# Patient Record
Sex: Male | Born: 1975 | Race: Black or African American | Hispanic: No | Marital: Single | Smoking: Light tobacco smoker
Health system: Southern US, Community
[De-identification: ages and names within clinical notes are randomized; demographics above are authoritative.]

## PROBLEM LIST (undated history)

## (undated) DIAGNOSIS — I429 Cardiomyopathy, unspecified: Secondary | ICD-10-CM

## (undated) DIAGNOSIS — N319 Neuromuscular dysfunction of bladder, unspecified: Secondary | ICD-10-CM

## (undated) DIAGNOSIS — I2510 Atherosclerotic heart disease of native coronary artery without angina pectoris: Secondary | ICD-10-CM

## (undated) DIAGNOSIS — G8220 Paraplegia, unspecified: Secondary | ICD-10-CM

## (undated) DIAGNOSIS — L89893 Pressure ulcer of other site, stage 3: Secondary | ICD-10-CM

## (undated) DIAGNOSIS — Z95810 Presence of automatic (implantable) cardiac defibrillator: Secondary | ICD-10-CM

## (undated) DIAGNOSIS — G629 Polyneuropathy, unspecified: Secondary | ICD-10-CM

## (undated) DIAGNOSIS — K4090 Unilateral inguinal hernia, without obstruction or gangrene, not specified as recurrent: Secondary | ICD-10-CM

## (undated) DIAGNOSIS — H409 Unspecified glaucoma: Secondary | ICD-10-CM

## (undated) DIAGNOSIS — Z87440 Personal history of urinary (tract) infections: Secondary | ICD-10-CM

## (undated) HISTORY — PX: SACRAL DECUBITUS ULCER EXCISION: SUR512

## (undated) HISTORY — PX: OTHER SURGICAL HISTORY: SHX169

## (undated) HISTORY — PX: CARDIAC DEFIBRILLATOR PLACEMENT: SHX171

---
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
## 2006-06-29 ENCOUNTER — Emergency Department (HOSPITAL_COMMUNITY): Admission: EM | Admit: 2006-06-29 | Discharge: 2006-06-29 | Payer: Self-pay | Admitting: Emergency Medicine

## 2006-07-18 ENCOUNTER — Emergency Department (HOSPITAL_COMMUNITY): Admission: EM | Admit: 2006-07-18 | Discharge: 2006-07-18 | Payer: Self-pay | Admitting: Emergency Medicine

## 2006-07-19 ENCOUNTER — Emergency Department (HOSPITAL_COMMUNITY): Admission: EM | Admit: 2006-07-19 | Discharge: 2006-07-19 | Payer: Self-pay | Admitting: Emergency Medicine

## 2006-07-27 ENCOUNTER — Emergency Department (HOSPITAL_COMMUNITY): Admission: EM | Admit: 2006-07-27 | Discharge: 2006-07-27 | Payer: Self-pay | Admitting: Emergency Medicine

## 2006-07-30 ENCOUNTER — Ambulatory Visit: Payer: Self-pay | Admitting: Internal Medicine

## 2006-07-30 ENCOUNTER — Encounter: Payer: Self-pay | Admitting: Emergency Medicine

## 2006-07-30 ENCOUNTER — Inpatient Hospital Stay (HOSPITAL_COMMUNITY): Admission: EM | Admit: 2006-07-30 | Discharge: 2006-08-03 | Payer: Self-pay | Admitting: Cardiovascular Disease

## 2006-08-17 ENCOUNTER — Ambulatory Visit: Payer: Self-pay | Admitting: Cardiology

## 2006-09-29 ENCOUNTER — Encounter (HOSPITAL_COMMUNITY): Admission: RE | Admit: 2006-09-29 | Discharge: 2006-12-28 | Payer: Self-pay | Admitting: Cardiology

## 2006-10-13 ENCOUNTER — Encounter: Payer: Self-pay | Admitting: Cardiology

## 2006-10-13 ENCOUNTER — Ambulatory Visit: Payer: Self-pay

## 2006-11-18 ENCOUNTER — Ambulatory Visit: Payer: Self-pay | Admitting: Cardiology

## 2006-12-08 ENCOUNTER — Ambulatory Visit: Payer: Self-pay | Admitting: Cardiology

## 2007-02-24 ENCOUNTER — Emergency Department (HOSPITAL_COMMUNITY): Admission: EM | Admit: 2007-02-24 | Discharge: 2007-02-24 | Payer: Self-pay | Admitting: Emergency Medicine

## 2011-02-26 NOTE — Assessment & Plan Note (Signed)
[facility]                            CARDIOLOGY OFFICE NOTE   AUTHUR, CUBIT                    MRN:          [id number]  DATE:11/18/2006                            DOB:          02/16/1976    HISTORY OF PRESENT ILLNESS:  [name] [name] is a 35 year old gentleman  who presented in October 2007 with inferior myocardial infarction.  It  appeared that he had also suffered an anterior myocardial infarction  approximately 1 week prior by symptoms and electrocardiogram.  We  emergently revascularized the RCA and subsequently the LAD using bare-  metal stents.  At that time, his ejection fraction was 45%.  His post  infarct course has been uncomplicated clinically.  However, repeat  echocardiogram performed October 13, 2006 demonstrated decrease in his EF  to 35% to 30% with akinesis of the apex and mid to distal septum, mid to  distal anterior wall and mid to distal inferior wall.  There was no  significant mitral regurgitation.   He has been doing well at cardiac rehabilitation with some minimal  dyspnea.  He has not had any orthopnea, PND, palpitations, syncope or  presyncope.   CURRENT MEDICATIONS:  1. Plavix 75 mg daily.  2. Lipitor 80 mg daily.  3. Ditropan 5 mg twice daily.  4. Coreg 6.225 mg twice daily.  5. Lisinopril 10 mg daily.  6. Cipro 500 mg twice daily, which he is taking for abscess on his      face.  7. Aspirin 81 mg daily.   PHYSICAL EXAMINATION:  He is generally well appearing in his wheelchair.  Heart rate is 73.  Blood pressure 138/88.  Weight of 224 pounds.  He has no jugular venous distention, thyromegaly, or lymphadenopathy.  HEENT:  Remarkable for skin abscess over his left lower face.  LUNGS:  Clear to auscultation.  Respiratory effort is normal.  He has a nondisplaced point of maximal cardiac impulse.  There is  regular rate and rhythm with normal S1 and S2.  There is no murmur or  S3.  There is an S4.  ABDOMEN:  Soft, non-distended, non-tender.  There is no  hepatosplenomegaly.  Bowel sounds are normal.  He is paraplegic in a wheelchair.  EXTREMITIES:  Warm without edema.   Electrocardiogram demonstrates normal sinus rhythm with evolving  anterolateral myocardial infarction.   IMPRESSION/RECOMMENDATIONS:  1. Coronary disease:  Status post anterior and inferior myocardial      infarctions.  Ejection fraction declined from 45% to 25% to 30%.      We will increase Lisinopril to 20 mg daily and Coreg to 12.5 mg      twice daily.  Continue aspirin and Plavix.  Can stop the Plavix in      June.  We will refer him to Heart Failure Clinic for up titration      of his medications and possible initiation of spironolactone and/or      BiDil.  2. Paraplegia.  3. Hypercholesterolemia:  Check lipids and LFTs at next visit.  4. Risk of ventricular arrhythmia:  With ejection fraction down, we  can consider implantable cardioverter-defibrillator implantation.      However, since he is asymptomatic and we are still up titrating his      heart failure medicines, I would like to see if we can get any      beneficial impact on the ejection fraction, which might allow [name] to      avoid consideration of an implantable cardioverter-defibrillator.      We will, therefore, defer this until after full up titration of      medications, at which time we will obtain another echocardiogram.     [name] Farber, [name] [name]  Electronically Signed    WED/MedQ  DD: 11/18/2006  DT: 11/18/2006  Job #: [id number]

## 2011-02-26 NOTE — Cardiovascular Report (Signed)
NAME:  [name] [name], [name] [name]           ACCOUNT NO.:  [id number]   MEDICAL RECORD NO.:  [id number]            PATIENT TYPE:   LOCATION:                                 FACILITY:   PHYSICIAN:  Salvadore Farber, MD       DATE OF BIRTH:   DATE OF PROCEDURE:  08/02/2006  DATE OF DISCHARGE:                              CARDIAC CATHETERIZATION   PROCEDURE:  Bare metal stenting of the proximal LAD and proximal ramus  intermedius.   INDICATIONS:  [name] [name] is a 35 year old gentleman who presented with  myocardial infarction ST elevation myocardial infarction on October 20.  While electrocardiogram suggested anterior myocardial infarction he had that  TIMI 3 flow in the LAD but TIMI 2 flow with severe disease in the right  coronary artery.  In retrospect, it appears that he had had an anterior MI  one week prior to presentation and then presented with in the inferior non-  ST elevation myocardial infarction acutely.  We treated the right  successfully on October 20.  Postinfarct course has been uncomplicated.  He  now returns for percutaneous intervention of the severe disease of the LAD  and ramus.   PROCEDURAL TECHNIQUE:  Informed consent was obtained.  Under 1% lidocaine  local anesthesia, a 7-French sheath was placed in left common femoral artery  using the modified Seldinger technique.  Anticoagulation was initiated with  bivalirudin.  ACT was confirmed to be greater than 225 seconds.  A CLS 3.5  guide was advanced over wire and engaged in the ostium of the left main.   Attention was first turned to the LAD.  A Prowater wire was advanced to the  distal vessel without difficulty.  The lesion was directly stented using a  2.75 x 32 mm Liberte bare metal stent deployed at 16 atmospheres.  It was  then postdilated using a 3.0 x 18 mm PowerSail at 16 atmospheres.  Immediately after stent deployment, there was severe spasm of the distal  vessel.  This resolved with the administration of  intracoronary  nitroglycerin.  However, there remained a stenosis just at the distal stent  margin which appeared consistent with refractory spasm.  We elected to leave  the wire across this lesion and treat the ramus while we waited for what was  probably spasm to abate.   Attention was then turned to the ramus intermedius.  A Prowater wire was  advanced across into the distal ramus.  I predilated the lesion using a 2.0  x 9 mm Maverick at 4 atmospheres more distally and 6 atmospheres slightly  more proximally.  I then stented the lesion using a 2.0 x 15 mm mini Vision  deployed at 14 atmospheres.  I postdilated the stent using a 2.25 x 15 mm  Quantum at 16 atmospheres.  Final angiography demonstrated no residual  stenosis, no dissection, and TIMI 3 flow to the distal vasculature.  There  was some spasm just distal to the stented segment.   Attention was then returned to the LAD.  The spasm at the distal stent  margin had abated.  There was  no dye staining to suggest dissection.  Wires  were then removed.  The patient was transferred to holding room in stable  condition having tolerated the procedure well.   COMPLICATIONS:  None.   IMPRESSION/PLAN:  Successful bare metal stenting of the LAD and ramus  intermedius.  The patient will be maintained on aspirin indefinitely, and  Plavix for a year given his acute presentation and treatment with bare metal  stents.      Salvadore Farber, MD  Electronically Signed     WED/MEDQ  D:  08/02/2006  T:  08/03/2006  Job:  [id number]
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
## 2011-02-26 NOTE — Assessment & Plan Note (Signed)
[facility]                            CARDIOLOGY OFFICE NOTE   CORBEN, AUZENNE                    MRN:          [id number]  DATE:12/08/2006                            DOB:          [birth year]    PRIMARY:  None.   PRIMARY CARDIOLOGIST:  Dr. Samule Ohm.   REASON FOR EVALUATION:  Evaluate patient with ischemic cardiomyopathy.   HISTORY OF PRESENT ILLNESS:  The patient is a pleasant 35 year old  gentleman with ischemic cardiomyopathy following an anterior myocardial  infarction.  He has done marginally well.   INCOMPLETE DICTATION     Rollene Rotunda, MD, [facility]  Electronically Signed    JH/MedQ  DD: 12/08/2006  DT: 12/08/2006  Job #: [id number]

## 2011-02-26 NOTE — Discharge Summary (Signed)
NAME:  [name] [name], [name] [name]           ACCOUNT NO.:  [id number]   MEDICAL RECORD NO.:  [id number]          PATIENT TYPE:  INP   LOCATION:  6525                         FACILITY:  [facility]   PHYSICIAN:  Bettey Mare. Lawrence, NPDATE OF BIRTH:  [birth year]   DATE OF ADMISSION:  07/30/2006  DATE OF DISCHARGE:  08/03/2006                                 DISCHARGE SUMMARY   ADDENDUM:  This is the patient's discharge medications, which were left out  on his discharge summary.  Please make sure this is attached to his  discharge summary that gets sent to our office to the attention of Dr.  Randa Evens.   DISCHARGE MEDICATIONS:  1. Enteric-coated aspirin 81 mg 1 p.o. daily.  2. Plavix 75 mg 1 p.o. daily.  3. Lipitor 80 mg 1 p.o. daily.  4. Protonix 40 mg 1 p.o. daily.  5. Coreg 3.125 mg b.i.d.  6. Lisinopril 5 mg 1 p.o. daily.  7. Ditropan 5 mg t.i.d.  8. Nitroglycerin 0.4 mg sublingual p.r.n. chest discomfort.           ______________________________  Bettey Mare. Lyman Bishop, NP     KML/MEDQ  D:  08/03/2006  T:  08/04/2006  Job:  [id number]   cc:   Salvadore Farber, MD
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
## 2011-02-26 NOTE — Assessment & Plan Note (Signed)
[facility]                              CARDIOLOGY OFFICE NOTE   [name], [name]                    MRN:          [id number]  DATE:08/17/2006                            DOB:          08/25/1976    HISTORY OF PRESENT ILLNESS:  [name] [name] is a 35 year old gentleman who  presented on October 20 with inferior myocardial infarction.  This appeared  to be in the setting of an anterior myocardial infarction, having occurred  one week prior.  He underwent emergent revascularization of the right  coronary artery and subsequently underwent stenting of the LAD and ramus.  Bare-metal stents were used for all.  Ejection fraction was approximately  45% with anterolateral and apical akinesis.   His postinfarct course was uncomplicated.  During hospitalization his ACE  inhibitor and beta blocker doses were limited due to low blood pressure.   Since discharge he has had some mild exertional dyspnea, particularly with  transfer from his wheelchair to a chair.  This is improved over the past  week.  He has not had any recurrence of the chest discomfort.  He has not  had any edema, palpitations, syncope or presyncope.   CURRENT MEDICATIONS:  1. Enteric-coated aspirin 81 mg per day.  2. Plavix 75 mg per day.  3. Lipitor 80 mg per day.  4. Protonix 40 mg per day.  5. Coreg 3.125 mg twice per day.  6. Lisinopril 5 mg per day.  7. Ditropan 5 mg twice per day.   PHYSICAL EXAMINATION:  He is generally well-appearing, in no distress.  Heart rate is 83, blood pressure 122/84.  He has no jugular venous distention, no thyromegaly.  LUNGS:  Clear to auscultation.  He has a nondisplaced point of maximal cardiac impulse.  There is a regular  rate and rhythm with normal S1 and S2.  There is no murmur or S3.  There is  an S4.  ABDOMEN:  Soft, nondistended, nontender.  There is no hepatosplenomegaly.  Bowel sounds are normal.  He is a paraplegic in a  wheelchair.  EXTREMITIES:  Warm without edema.   Electrocardiogram demonstrates normal sinus rhythm with evolving  anterolateral myocardial infarction.   IMPRESSION/RECOMMENDATIONS:  1. Coronary artery disease, status post anterior and inferior myocardial      infarctions:  Fortunately, ejection fraction was 45%.  We will continue      aspirin indefinitely.  Continue Plavix for 9 months.  Increase      lisinopril to 10 mg per day and Coreg to 6.25 mg twice per day.  Plan      on repeat echocardiogram to assess left ventricular systolic function      in approximately 6 weeks' time.  2. Hypercholesterolemia:  Continue Lipitor.  3. Post hospital cleanup:  Stop Protonix, as this was started for      prophylaxis in the hospital.  4. Need for primary care doctor:  We will get him back to [facility].  5. Paraplegia.     Salvadore Farber, MD  Electronically Signed    WED/MedQ  DD: 08/17/2006  DT: 08/18/2006  Job #: [id number]
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
## 2011-02-26 NOTE — Cardiovascular Report (Signed)
NAME:  [name] [name], [name] [name] NO.:  [id number]   MEDICAL RECORD NO.:  [id number]            PATIENT TYPE:   LOCATION:                                 FACILITY:   PHYSICIAN:  Salvadore Farber, MD       DATE OF BIRTH:   DATE OF PROCEDURE:  07/30/2006  DATE OF DISCHARGE:                              CARDIAC CATHETERIZATION   PROCEDURE:  Left heart catheterization, left ventriculography, coronary  angiography, placement of 4 bare metal stents to the right coronary artery,  AngioSeal closure of the right common femoral arteriotomy site.   INDICATIONS:  [name] [name] is a 35 year old gentleman without prior  history of atherosclerotic disease. Risk factors of tobacco and possible  cocaine use.  He presented to hospital 1 week ago with chest discomfort and,  shortness of breath, and hemoptysis.  He was diagnosed with pneumonia,  treated with antibiotics and narcotics.  Symptoms have persisted throughout  the week.  He represented to [facility], this afternoon, with same  symptoms. He has been short of breath throughout the week.  However, at  approximately 12:30, this afternoon, chest discomfort increased markedly  prompting him to represent; it remains ongoing 7/10.   Electrocardiogram demonstrates normal sinus rhythm with anterolateral Q  waves with anterolateral ST elevations.  While it does appear that he  suffered a large anterior myocardial infarction approximately 1 week ago by  electrocardiogram and history, he appears to be having a recurrent  myocardial infarction.  We, therefore, brought him to the cardiac  catheterization lab for angiography with an adequate percutaneous  revascularization.   PROCEDURAL TECHNIQUE:  Informed consent was obtained.  Under 1% lidocaine  local anesthesia, a 6-French sheath was placed in the right common femoral  artery using the modified Seldinger technique.  Diagnostic angiography and  ventriculography were performed  using JL-4, JR-4, and pigtail catheters.  These images demonstrated a 95% stenosis in the proximal LAD and a 90%  stenosis in the ramus intermedius and a 95% stenosis in the right coronary  artery.  There was anterolateral akinesis on ventriculogram.  Flow in the  LAD and ramus was TIMI 3.  However, flow in the right coronary artery was  TIMI 2. In addition, he had occlusion of a small posterolateral branch of  the right coronary.  Based on this distal occlusion and flow pattern, it  appeared that he had suffered an anterior myocardial infarction 1 week ago  and was now having an inferior non-ST elevation myocardial infarction.  With  that said, it was difficult to be certain of the culprit lesion.  For the  reasons stated above, however, I decided to proceed with percutaneous  revascularization of the right coronary artery.   Heparin had been administered in the emergency room; additional heparin was  given to achieve and maintain an ACT of greater than 275 seconds.  I chose  not to use a glycoprotein 2B 3A antagonist due to the patient's hemoptysis.  A JR-4 guiding catheter was side holes was advanced over the wire and  engaged in the ostium of the right coronary  artery.  A Prowater wire was  advanced into the posterior left ventricular branch without difficulty.  I  began by predilating the distal RCA using a 2.5 x 15 mm Maverick.  This  established TIMI 3 flow to the PDA; however, the posterior left ventricular  branch remained occluded.  I advanced the balloon into the posterior left  ventricular branch and performed additional balloon angioplasty there.  This  did not; however, restore any flow to it.  As the collaterals had suggested  that the posterior left ventricular branch was fairly small, I decided to  continue with revascularization of the main portion of the right coronary  artery as this supplied a much larger territory.  To that end, I advanced a  3.0 x 32 mm Liberte stent  to the distal right coronary artery extending  across disease in the ostium of the PDA into the proximal portion of the PDA  itself.  I deployed this at 14 atmospheres.  I then advanced a 2.75 x 16 mm  Liberte stent so as to overlap the proximal portion of this stent, and cover  additional segment stenosis in this distal right coronary.  I deployed it at  16 atmospheres.  I then postdilated both of the stents using a 3 mm Quantum  at 16 atmospheres and 18 atmospheres at the overlap site.   I then turned my attention to the stenosis at the ostium.  This 70% ostial  right coronary artery stenosis was stented using a 3.5 x 24 mm Liberte.  I  deployed at 16 atmospheres.  We then turned attention to the hazy 70%  stenosis in the midportion of the right coronary.  I stented this using a  3.0 x 24 mm Liberte deployed at 16 atmospheres.  I then postdilated the mid-  and-proximal stents using a 3.5 mm Quantum.  Final angiography demonstrated  no residual stenosis, no dissection, and TIMI 3 flow to the distal  vasculature.  There was TIMI 3 flow reestablished to the posterior left  ventricular branch.   The arteriotomy was then closed using an AngioSeal device.  Complete  hemostasis was obtained.  The patient was then transferred to the intensive  care unit in stable condition having tolerated the procedure well.   COMPLICATIONS:  None.   FINDINGS:  1. LV:  135/16/36.  EF 45% with anterolateral and apical akinesis.  2. Left main:  Angiographically normal.  3. LAD:  Fairly large vessel giving rise to a moderate sized branching      diagonal.  The LAD had a 95% stenosis proximally and then a 70%      stenosis in the midportion extending across the diagonal.  The diagonal      is moderate-sized and had 70% stenosis proximally extending into both      major branches.  4. Ramus intermedius:  A fairly large vessel with a 90% stenosis in its      mid section extending across the bifurcation. 5.  Circumflex:  A moderate-sized vessel giving rise to marginals.  It is      angiographically normal.  6. RCA:  A large, dominant vessel.  It is diffusely diseased.  There is a      70% ostial stenosis, a 70% mid vessel stenosis, and then 95 and 90%      stenoses of the distal vessel extending into the ostium of the PDA.      The PLV was occluded.  All of these were treated  with bare metal stents      reducing stenosis to 0% and establishing TIMI 3 flow.   COMPLICATIONS:  None   IMPRESSION/PLAN:  Severe 2-vessel coronary disease with clear-cut evidence  of prior anterior myocardial infarction, but evidence of ongoing inferior,  but strong angiographic evidence of ongoing inferior myocardial infarction.  I treated the right coronary artery successfully establishing TIMI 3 flow.   Will transfer him to the cardiac intensive care unit.  Will avoid beta  blocker due to concern with cocaine use.  Will initiate aspirin, Plavix, ACE  inhibitor and statin. Cessation of tobacco and cocaine use is strongly  advised.  Will plan revascularization of the LAD and ramus intermedius as he  recovers.      Salvadore Farber, MD  Electronically Signed     WED/MEDQ  D:  08/04/2006  T:  08/05/2006  Job:  [id number]

## 2012-07-25 ENCOUNTER — Encounter (HOSPITAL_COMMUNITY): Payer: Self-pay | Admitting: *Deleted

## 2012-07-25 ENCOUNTER — Inpatient Hospital Stay (HOSPITAL_COMMUNITY)
Admission: EM | Admit: 2012-07-25 | Discharge: 2012-08-21 | DRG: 853 | Disposition: A | Discharge status: Skilled Nursing Facility | Payer: Medicaid Other | Attending: Internal Medicine | Admitting: Internal Medicine

## 2012-07-25 ENCOUNTER — Emergency Department (HOSPITAL_COMMUNITY): Payer: Medicaid Other

## 2012-07-25 ENCOUNTER — Inpatient Hospital Stay (HOSPITAL_COMMUNITY): Payer: Medicaid Other

## 2012-07-25 DIAGNOSIS — R6510 Systemic inflammatory response syndrome (SIRS) of non-infectious origin without acute organ dysfunction: Secondary | ICD-10-CM | POA: Diagnosis present

## 2012-07-25 DIAGNOSIS — L03317 Cellulitis of buttock: Secondary | ICD-10-CM | POA: Diagnosis present

## 2012-07-25 DIAGNOSIS — I5022 Chronic systolic (congestive) heart failure: Secondary | ICD-10-CM | POA: Diagnosis present

## 2012-07-25 DIAGNOSIS — I959 Hypotension, unspecified: Secondary | ICD-10-CM

## 2012-07-25 DIAGNOSIS — K5900 Constipation, unspecified: Secondary | ICD-10-CM | POA: Diagnosis present

## 2012-07-25 DIAGNOSIS — N319 Neuromuscular dysfunction of bladder, unspecified: Secondary | ICD-10-CM | POA: Diagnosis present

## 2012-07-25 DIAGNOSIS — N390 Urinary tract infection, site not specified: Secondary | ICD-10-CM

## 2012-07-25 DIAGNOSIS — A498 Other bacterial infections of unspecified site: Secondary | ICD-10-CM | POA: Diagnosis present

## 2012-07-25 DIAGNOSIS — A4101 Sepsis due to Methicillin susceptible Staphylococcus aureus: Secondary | ICD-10-CM

## 2012-07-25 DIAGNOSIS — E871 Hypo-osmolality and hyponatremia: Secondary | ICD-10-CM

## 2012-07-25 DIAGNOSIS — I2510 Atherosclerotic heart disease of native coronary artery without angina pectoris: Secondary | ICD-10-CM | POA: Diagnosis present

## 2012-07-25 DIAGNOSIS — G8220 Paraplegia, unspecified: Secondary | ICD-10-CM

## 2012-07-25 DIAGNOSIS — E860 Dehydration: Secondary | ICD-10-CM

## 2012-07-25 DIAGNOSIS — N289 Disorder of kidney and ureter, unspecified: Secondary | ICD-10-CM

## 2012-07-25 DIAGNOSIS — K4090 Unilateral inguinal hernia, without obstruction or gangrene, not specified as recurrent: Secondary | ICD-10-CM

## 2012-07-25 DIAGNOSIS — L8994 Pressure ulcer of unspecified site, stage 4: Secondary | ICD-10-CM | POA: Diagnosis present

## 2012-07-25 DIAGNOSIS — L89154 Pressure ulcer of sacral region, stage 4: Secondary | ICD-10-CM

## 2012-07-25 DIAGNOSIS — Z905 Acquired absence of kidney: Secondary | ICD-10-CM

## 2012-07-25 DIAGNOSIS — L89109 Pressure ulcer of unspecified part of back, unspecified stage: Secondary | ICD-10-CM

## 2012-07-25 DIAGNOSIS — Y33XXXS Other specified events, undetermined intent, sequela: Secondary | ICD-10-CM

## 2012-07-25 DIAGNOSIS — L98429 Non-pressure chronic ulcer of back with unspecified severity: Secondary | ICD-10-CM

## 2012-07-25 DIAGNOSIS — Z79899 Other long term (current) drug therapy: Secondary | ICD-10-CM

## 2012-07-25 DIAGNOSIS — G8929 Other chronic pain: Secondary | ICD-10-CM | POA: Diagnosis present

## 2012-07-25 DIAGNOSIS — M549 Dorsalgia, unspecified: Secondary | ICD-10-CM | POA: Diagnosis present

## 2012-07-25 DIAGNOSIS — D649 Anemia, unspecified: Secondary | ICD-10-CM

## 2012-07-25 DIAGNOSIS — I330 Acute and subacute infective endocarditis: Secondary | ICD-10-CM | POA: Diagnosis not present

## 2012-07-25 DIAGNOSIS — Z9861 Coronary angioplasty status: Secondary | ICD-10-CM

## 2012-07-25 DIAGNOSIS — L89159 Pressure ulcer of sacral region, unspecified stage: Secondary | ICD-10-CM | POA: Diagnosis present

## 2012-07-25 DIAGNOSIS — L0231 Cutaneous abscess of buttock: Secondary | ICD-10-CM | POA: Diagnosis present

## 2012-07-25 DIAGNOSIS — E43 Unspecified severe protein-calorie malnutrition: Secondary | ICD-10-CM | POA: Diagnosis present

## 2012-07-25 DIAGNOSIS — I2589 Other forms of chronic ischemic heart disease: Secondary | ICD-10-CM | POA: Diagnosis present

## 2012-07-25 DIAGNOSIS — Z87440 Personal history of urinary (tract) infections: Secondary | ICD-10-CM

## 2012-07-25 DIAGNOSIS — I429 Cardiomyopathy, unspecified: Secondary | ICD-10-CM

## 2012-07-25 DIAGNOSIS — I428 Other cardiomyopathies: Secondary | ICD-10-CM

## 2012-07-25 DIAGNOSIS — E876 Hypokalemia: Secondary | ICD-10-CM | POA: Diagnosis not present

## 2012-07-25 DIAGNOSIS — M869 Osteomyelitis, unspecified: Secondary | ICD-10-CM

## 2012-07-25 DIAGNOSIS — Z7982 Long term (current) use of aspirin: Secondary | ICD-10-CM

## 2012-07-25 DIAGNOSIS — T827XXA Infection and inflammatory reaction due to other cardiac and vascular devices, implants and grafts, initial encounter: Secondary | ICD-10-CM

## 2012-07-25 DIAGNOSIS — Z87891 Personal history of nicotine dependence: Secondary | ICD-10-CM

## 2012-07-25 DIAGNOSIS — R6521 Severe sepsis with septic shock: Secondary | ICD-10-CM | POA: Diagnosis present

## 2012-07-25 DIAGNOSIS — N179 Acute kidney failure, unspecified: Secondary | ICD-10-CM

## 2012-07-25 DIAGNOSIS — A419 Sepsis, unspecified organism: Secondary | ICD-10-CM

## 2012-07-25 DIAGNOSIS — I509 Heart failure, unspecified: Secondary | ICD-10-CM | POA: Diagnosis present

## 2012-07-25 DIAGNOSIS — E875 Hyperkalemia: Secondary | ICD-10-CM

## 2012-07-25 DIAGNOSIS — D638 Anemia in other chronic diseases classified elsewhere: Secondary | ICD-10-CM | POA: Diagnosis present

## 2012-07-25 DIAGNOSIS — Y831 Surgical operation with implant of artificial internal device as the cause of abnormal reaction of the patient, or of later complication, without mention of misadventure at the time of the procedure: Secondary | ICD-10-CM | POA: Diagnosis present

## 2012-07-25 DIAGNOSIS — IMO0002 Reserved for concepts with insufficient information to code with codable children: Secondary | ICD-10-CM

## 2012-07-25 DIAGNOSIS — G609 Hereditary and idiopathic neuropathy, unspecified: Secondary | ICD-10-CM | POA: Diagnosis present

## 2012-07-25 HISTORY — DX: Unilateral inguinal hernia, without obstruction or gangrene, not specified as recurrent: K40.90

## 2012-07-25 HISTORY — DX: Presence of automatic (implantable) cardiac defibrillator: Z95.810

## 2012-07-25 HISTORY — DX: Paraplegia, unspecified: G82.20

## 2012-07-25 HISTORY — DX: Personal history of urinary (tract) infections: Z87.440

## 2012-07-25 HISTORY — DX: Cardiomyopathy, unspecified: I42.9

## 2012-07-25 HISTORY — DX: Polyneuropathy, unspecified: G62.9

## 2012-07-25 HISTORY — DX: Unspecified glaucoma: H40.9

## 2012-07-25 HISTORY — DX: Neuromuscular dysfunction of bladder, unspecified: N31.9

## 2012-07-25 HISTORY — DX: Pressure ulcer of other site, stage 3: L89.893

## 2012-07-25 LAB — BASIC METABOLIC PANEL
Calcium: 7.7 mg/dL — ABNORMAL LOW (ref 8.4–10.5)
GFR calc Af Amer: 50 mL/min — ABNORMAL LOW (ref >90)
GFR calc non Af Amer: 43 mL/min — ABNORMAL LOW (ref >90)
Glucose, Bld: 129 mg/dL — ABNORMAL HIGH (ref 70–99)
Potassium: 3.6 mEq/L (ref 3.5–5.1)
Sodium: 129 mEq/L — ABNORMAL LOW (ref 135–145)

## 2012-07-25 LAB — URINALYSIS, ROUTINE W REFLEX MICROSCOPIC
Bilirubin Urine: NEGATIVE
Ketones, ur: NEGATIVE mg/dL
Nitrite: NEGATIVE
Specific Gravity, Urine: 1.01 (ref 1.005–1.030)
Urobilinogen, UA: 1 mg/dL (ref 0.0–1.0)
pH: 6 (ref 5.0–8.0)

## 2012-07-25 LAB — CBC WITH DIFFERENTIAL/PLATELET
Basophils Relative: 0 % (ref 0–1)
Eosinophils Absolute: 0 10*3/uL (ref 0.0–0.7)
Eosinophils Relative: 0 % (ref 0–5)
HCT: 19.4 % — ABNORMAL LOW (ref 39.0–52.0)
Hemoglobin: 6.1 g/dL — CL (ref 13.0–17.0)
MCH: 25.3 pg — ABNORMAL LOW (ref 26.0–34.0)
MCHC: 31.4 g/dL (ref 30.0–36.0)
MCV: 80.5 fL (ref 78.0–100.0)
Monocytes Absolute: 0.6 10*3/uL (ref 0.1–1.0)
Neutro Abs: 17.1 10*3/uL — ABNORMAL HIGH (ref 1.7–7.7)
RBC: 2.41 MIL/uL — ABNORMAL LOW (ref 4.22–5.81)

## 2012-07-25 LAB — COMPREHENSIVE METABOLIC PANEL
ALT: 12 U/L (ref 0–53)
Alkaline Phosphatase: 170 U/L — ABNORMAL HIGH (ref 39–117)
BUN: 47 mg/dL — ABNORMAL HIGH (ref 6–23)
CO2: 21 mEq/L (ref 19–32)
Calcium: 8 mg/dL — ABNORMAL LOW (ref 8.4–10.5)
GFR calc Af Amer: 50 mL/min — ABNORMAL LOW (ref >90)
GFR calc non Af Amer: 43 mL/min — ABNORMAL LOW (ref >90)
Glucose, Bld: 131 mg/dL — ABNORMAL HIGH (ref 70–99)
Total Protein: 7.7 g/dL (ref 6.0–8.3)

## 2012-07-25 LAB — IRON AND TIBC
Saturation Ratios: 14 % — ABNORMAL LOW (ref 20–55)
UIBC: 94 ug/dL — ABNORMAL LOW (ref 125–400)

## 2012-07-25 LAB — RETICULOCYTES
RBC.: 2.29 MIL/uL — ABNORMAL LOW (ref 4.22–5.81)
Retic Ct Pct: 1.6 % (ref 0.4–3.1)

## 2012-07-25 LAB — URINE MICROSCOPIC-ADD ON

## 2012-07-25 MED ORDER — INSULIN REGULAR HUMAN 100 UNIT/ML IJ SOLN: 10.0000 [IU] | Freq: Once | INTRAMUSCULAR | Status: DC

## 2012-07-25 MED ORDER — SODIUM CHLORIDE 0.9 % IV BOLUS (SEPSIS)
1000.0000 mL | Freq: Once | INTRAVENOUS | Status: AC
  Administered 2012-07-25: 1000 mL via INTRAVENOUS

## 2012-07-25 MED ORDER — NON FORMULARY: Freq: Two times a day (BID) | Status: DC

## 2012-07-25 MED ORDER — CALCIUM CARBONATE-VITAMIN D 500-200 MG-UNIT PO TABS
2.0000 | ORAL_TABLET | Freq: Two times a day (BID) | ORAL | Status: DC
  Administered 2012-07-25 – 2012-07-26 (×2): 2 via ORAL
  Filled 2012-07-25 (×19): qty 2

## 2012-07-25 MED ORDER — VANCOMYCIN HCL 1000 MG IV SOLR
750.0000 mg | Freq: Two times a day (BID) | INTRAVENOUS | Status: DC
  Administered 2012-07-26 – 2012-07-28 (×5): 750 mg via INTRAVENOUS
  Filled 2012-07-25 (×6): qty 750

## 2012-07-25 MED ORDER — METHADONE HCL 5 MG PO TABS
5.0000 mg | ORAL_TABLET | Freq: Two times a day (BID) | ORAL | Status: DC
  Administered 2012-07-25 – 2012-07-31 (×13): 5 mg via ORAL
  Filled 2012-07-25 (×15): qty 1

## 2012-07-25 MED ORDER — SODIUM BICARBONATE 8.4 % IV SOLN
50.0000 meq | Freq: Once | INTRAVENOUS | Status: AC
  Administered 2012-07-25: 50 meq via INTRAVENOUS
  Filled 2012-07-25: qty 50

## 2012-07-25 MED ORDER — VANCOMYCIN HCL IN DEXTROSE 1-5 GM/200ML-% IV SOLN
1000.0000 mg | Freq: Once | INTRAVENOUS | Status: AC
  Administered 2012-07-25: 1000 mg via INTRAVENOUS
  Filled 2012-07-25: qty 200

## 2012-07-25 MED ORDER — SODIUM CHLORIDE 0.9 % IV SOLN: INTRAVENOUS | Status: AC

## 2012-07-25 MED ORDER — DEXTROSE 50 % IV SOLN
1.0000 | Freq: Once | INTRAVENOUS | Status: AC
  Administered 2012-07-25: 50 mL via INTRAVENOUS
  Filled 2012-07-25: qty 50

## 2012-07-25 MED ORDER — ONDANSETRON HCL 4 MG/2ML IJ SOLN
4.0000 mg | Freq: Four times a day (QID) | INTRAMUSCULAR | Status: DC | PRN
  Administered 2012-07-27 – 2012-07-28 (×3): 4 mg via INTRAVENOUS
  Filled 2012-07-25 (×3): qty 2

## 2012-07-25 MED ORDER — PIPERACILLIN-TAZOBACTAM 3.375 G IVPB
3.3750 g | Freq: Three times a day (TID) | INTRAVENOUS | Status: DC
  Administered 2012-07-26 – 2012-07-28 (×9): 3.375 g via INTRAVENOUS
  Filled 2012-07-25 (×11): qty 50

## 2012-07-25 MED ORDER — ACETAMINOPHEN 650 MG RE SUPP: 650.0000 mg | Freq: Four times a day (QID) | RECTAL | Status: DC | PRN

## 2012-07-25 MED ORDER — HYDROMORPHONE HCL PF 1 MG/ML IJ SOLN
1.0000 mg | INTRAMUSCULAR | Status: DC | PRN
  Administered 2012-07-26 – 2012-07-27 (×3): 1 mg via INTRAVENOUS
  Administered 2012-07-27: 0.5 mg via INTRAVENOUS
  Administered 2012-07-28 – 2012-08-04 (×17): 1 mg via INTRAVENOUS
  Filled 2012-07-25 (×21): qty 1

## 2012-07-25 MED ORDER — HYDROCODONE-ACETAMINOPHEN 5-325 MG PO TABS
1.0000 | ORAL_TABLET | ORAL | Status: DC | PRN
  Administered 2012-07-27 – 2012-08-05 (×17): 2 via ORAL
  Administered 2012-08-05: 1 via ORAL
  Administered 2012-08-05 – 2012-08-09 (×17): 2 via ORAL
  Administered 2012-08-09: 1 via ORAL
  Administered 2012-08-09 – 2012-08-12 (×9): 2 via ORAL
  Filled 2012-07-25 (×46): qty 2

## 2012-07-25 MED ORDER — INSULIN ASPART 100 UNIT/ML ~~LOC~~ SOLN
10.0000 [IU] | Freq: Once | SUBCUTANEOUS | Status: DC
  Filled 2012-07-25: qty 1

## 2012-07-25 MED ORDER — PIPERACILLIN-TAZOBACTAM 3.375 G IVPB
3.3750 g | Freq: Once | INTRAVENOUS | Status: DC
  Administered 2012-07-25: 3.375 g via INTRAVENOUS
  Filled 2012-07-25: qty 50

## 2012-07-25 MED ORDER — SODIUM CHLORIDE 0.9 % IV SOLN
INTRAVENOUS | Status: DC
  Administered 2012-07-25: 125 mL/h via INTRAVENOUS
  Administered 2012-07-26 – 2012-07-27 (×2): via INTRAVENOUS
  Administered 2012-07-27: 150 mL/h via INTRAVENOUS
  Administered 2012-07-28 (×2): via INTRAVENOUS

## 2012-07-25 MED ORDER — FERROUS GLUCONATE 324 (38 FE) MG PO TABS
324.0000 mg | ORAL_TABLET | Freq: Two times a day (BID) | ORAL | Status: DC
  Administered 2012-07-25 – 2012-08-21 (×50): 324 mg via ORAL
  Filled 2012-07-25 (×56): qty 1

## 2012-07-25 MED ORDER — SODIUM CHLORIDE 0.9 % IV BOLUS (SEPSIS): 1000.0000 mL | Freq: Once | INTRAVENOUS | Status: DC

## 2012-07-25 MED ORDER — GABAPENTIN 300 MG PO CAPS
900.0000 mg | ORAL_CAPSULE | Freq: Three times a day (TID) | ORAL | Status: DC
  Administered 2012-07-25 – 2012-08-04 (×4): 900 mg via ORAL
  Filled 2012-07-25 (×37): qty 3

## 2012-07-25 MED ORDER — SODIUM CHLORIDE 0.9 % IV SOLN
8.0000 mg/h | INTRAVENOUS | Status: DC
  Filled 2012-07-25: qty 80

## 2012-07-25 MED ORDER — ACETAMINOPHEN 325 MG PO TABS: 650.0000 mg | ORAL_TABLET | Freq: Four times a day (QID) | ORAL | Status: DC | PRN

## 2012-07-25 MED ORDER — ONDANSETRON HCL 4 MG PO TABS
4.0000 mg | ORAL_TABLET | Freq: Four times a day (QID) | ORAL | Status: DC | PRN
  Administered 2012-08-07: 4 mg via ORAL
  Filled 2012-07-25 (×2): qty 1

## 2012-07-25 MED ORDER — ONDANSETRON HCL 4 MG/2ML IJ SOLN: 4.0000 mg | Freq: Three times a day (TID) | INTRAMUSCULAR | Status: DC | PRN

## 2012-07-25 MED ORDER — DAKINS (1/4 STRENGTH) 0.125 % EX SOLN
Freq: Two times a day (BID) | CUTANEOUS | Status: AC
  Administered 2012-07-26: 09:00:00
  Administered 2012-07-26: 1
  Administered 2012-07-27: 17:00:00
  Filled 2012-07-25 (×2): qty 473

## 2012-07-25 MED ORDER — INSULIN ASPART 100 UNIT/ML IV SOLN
10.0000 [IU] | Freq: Once | INTRAVENOUS | Status: AC
  Administered 2012-07-25: 10 [IU] via INTRAVENOUS
  Filled 2012-07-25: qty 0.1

## 2012-07-25 MED ORDER — PANTOPRAZOLE SODIUM 40 MG IV SOLR
40.0000 mg | Freq: Two times a day (BID) | INTRAVENOUS | Status: DC
  Administered 2012-07-25 – 2012-07-28 (×5): 40 mg via INTRAVENOUS
  Filled 2012-07-25 (×7): qty 40

## 2012-07-25 MED ORDER — SIMVASTATIN 40 MG PO TABS
40.0000 mg | ORAL_TABLET | Freq: Every day | ORAL | Status: DC
  Administered 2012-07-25 – 2012-08-20 (×26): 40 mg via ORAL
  Filled 2012-07-25 (×29): qty 1

## 2012-07-25 MED ORDER — OXYBUTYNIN CHLORIDE 5 MG PO TABS
5.0000 mg | ORAL_TABLET | Freq: Three times a day (TID) | ORAL | Status: DC
  Administered 2012-07-25 – 2012-08-21 (×68): 5 mg via ORAL
  Filled 2012-07-25 (×83): qty 1

## 2012-07-25 MED ORDER — SODIUM CHLORIDE 0.9 % IV SOLN
80.0000 mg | Freq: Once | INTRAVENOUS | Status: DC
  Filled 2012-07-25: qty 80
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
## 2012-07-25 NOTE — Progress Notes (Signed)
[facility] ED CM noted cm consult spoke with pt who states he has not used a home health agency previously and would have to review the list provided Pending pt choice

## 2012-07-25 NOTE — ED Notes (Signed)
Pt also has wound to buttocks. Reports stage 3.

## 2012-07-25 NOTE — ED Notes (Signed)
Phlebotomy unable to get second set of blood cultures.  Vancomycin started IV.

## 2012-07-25 NOTE — ED Notes (Signed)
MD at bedside. 
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
## 2012-07-25 NOTE — ED Notes (Signed)
Wound on buttocks is large draining yellowish discharge.  Pt reports he has never been in the care of a wound care center and that he has been incarcerated until recently.  A procedure was scheduled but patient was released from prison in Sept so he never received treatment.  Pt reports he has had wound for 18 months.  He had been incarcerated for 7 years.

## 2012-07-25 NOTE — ED Notes (Addendum)
Per EMS pt in from home c/o generalized weakness with other complaints. EMS reports pt was hypotensive on their arrival BP 88/44. 20G to RAC. Pt also has stage 3 ulcer to bottom. Pt also has AICD for cardiomyopathy.
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
## 2012-07-25 NOTE — ED Notes (Signed)
ZOX:[id number]<AV> Expected date:<BR> Expected time:<BR> Means of arrival:<BR> Comments:<BR> Weakness/hypotension

## 2012-07-25 NOTE — ED Notes (Signed)
Report called to [facility].

## 2012-07-25 NOTE — ED Notes (Signed)
IV nurse unable to start second line.

## 2012-07-25 NOTE — ED Notes (Signed)
Both set of blood cultures obtained and sent to lab.

## 2012-07-25 NOTE — ED Notes (Signed)
Patient transported to X-ray 

## 2012-07-25 NOTE — ED Notes (Signed)
Critical hemoglobin called to nurse from lab.  Dr Ranae Palms notified.  6.1

## 2012-07-25 NOTE — Progress Notes (Signed)
HOME HEALTH AGENCIES SERVING [place]   Agencies that are Medicare-Certified and are affiliated with The [facility] Home Health Agency  Telephone Number Address  [facility]   The [facility] has ownership interest in this company; however, you are under no obligation to use this agency. [phone] or  [phone] [address] [place], [place] [address] [email or web address]   Agencies that are Medicare-Certified and are not affiliated with The [facility] Agency Telephone Number Address  [facility] [phone] Fax [phone] [address], [address] [place], [place]  [address] [email or web address]  [facility] [phone] or [phone] Fax [phone] [address] [place], [place] [address] [email or web address]  [facility] [phone] Fax [phone] [address] [place], [place] [address] [email or web address]  [facility] [phone] Fax [phone] [address] [place], [place]  [address] [email or web address]  [facility] [phone] Fax [phone] [address] Suite [place], [place] [address] [email or web address]  [facility] Services of [facility] [phone] [address] [place], [place] [address] [email or web address]  [facility] [phone]  [address] [place], [place] [address] [email or web address]  [facility] [phone] or [phone] Fax [phone] [address] [place], [place]  [address] [email or web address]  [facility] [phone] Fax [phone] [address] [place], [place]  [address]  [facility]  [phone] Fax [phone] [address] [place], [place] [address] [email or web address]

## 2012-07-26 DIAGNOSIS — A419 Sepsis, unspecified organism: Secondary | ICD-10-CM | POA: Diagnosis present

## 2012-07-26 DIAGNOSIS — R6521 Severe sepsis with septic shock: Secondary | ICD-10-CM | POA: Diagnosis present

## 2012-07-26 DIAGNOSIS — I319 Disease of pericardium, unspecified: Secondary | ICD-10-CM

## 2012-07-26 LAB — BASIC METABOLIC PANEL
BUN: 29 mg/dL — ABNORMAL HIGH (ref 6–23)
CO2: 21 mEq/L (ref 19–32)
Calcium: 7.5 mg/dL — ABNORMAL LOW (ref 8.4–10.5)
Chloride: 100 mEq/L (ref 96–112)
Creatinine, Ser: 1.27 mg/dL (ref 0.50–1.35)
Creatinine, Ser: 1.03 mg/dL (ref 0.50–1.35)
GFR calc Af Amer: 81 mL/min — ABNORMAL LOW (ref >90)
GFR calc non Af Amer: 70 mL/min — ABNORMAL LOW (ref >90)
Glucose, Bld: 110 mg/dL — ABNORMAL HIGH (ref 70–99)

## 2012-07-26 LAB — LACTIC ACID, PLASMA: Lactic Acid, Venous: 1.2 mmol/L (ref 0.5–2.2)

## 2012-07-26 LAB — CBC
HCT: 19.1 % — ABNORMAL LOW (ref 39.0–52.0)
MCHC: 33.5 g/dL (ref 30.0–36.0)
RDW: 17.1 % — ABNORMAL HIGH (ref 11.5–15.5)

## 2012-07-26 LAB — HEMOGLOBIN AND HEMATOCRIT, BLOOD: Hemoglobin: 8 g/dL — ABNORMAL LOW (ref 13.0–17.0)

## 2012-07-26 MED ORDER — CHLORHEXIDINE GLUCONATE CLOTH 2 % EX PADS: 6.0000 | MEDICATED_PAD | Freq: Every day | CUTANEOUS | Status: AC

## 2012-07-26 MED ORDER — POTASSIUM CHLORIDE 20 MEQ/15ML (10%) PO LIQD
40.0000 meq | Freq: Three times a day (TID) | ORAL | Status: DC
  Administered 2012-07-26: 40 meq
  Filled 2012-07-26: qty 30

## 2012-07-26 MED ORDER — SODIUM CHLORIDE 0.9 % IV BOLUS (SEPSIS)
1000.0000 mL | Freq: Once | INTRAVENOUS | Status: AC
  Administered 2012-07-26: 1000 mL via INTRAVENOUS

## 2012-07-26 MED ORDER — BOOST / RESOURCE BREEZE PO LIQD
1.0000 | Freq: Two times a day (BID) | ORAL | Status: DC
  Administered 2012-07-26 – 2012-08-08 (×11): 1 via ORAL

## 2012-07-26 MED ORDER — ADULT MULTIVITAMIN W/MINERALS CH
1.0000 | ORAL_TABLET | Freq: Every day | ORAL | Status: DC
  Administered 2012-07-26 – 2012-08-21 (×20): 1 via ORAL
  Filled 2012-07-26 (×28): qty 1

## 2012-07-26 MED ORDER — ENSURE COMPLETE PO LIQD
237.0000 mL | Freq: Two times a day (BID) | ORAL | Status: DC
  Administered 2012-07-26 – 2012-07-30 (×5): 237 mL via ORAL

## 2012-07-26 MED ORDER — PERFLUTREN LIPID MICROSPHERE
1.0000 mL | INTRAVENOUS | Status: AC | PRN
  Administered 2012-07-26: 2 mL via INTRAVENOUS
  Administered 2012-07-26: 1 mL via INTRAVENOUS
  Filled 2012-07-26: qty 10

## 2012-07-26 MED ORDER — ENSURE COMPLETE PO LIQD: 237.0000 mL | Freq: Two times a day (BID) | ORAL | Status: DC

## 2012-07-26 MED ORDER — POTASSIUM CHLORIDE CRYS ER 20 MEQ PO TBCR
40.0000 meq | EXTENDED_RELEASE_TABLET | ORAL | Status: AC
  Administered 2012-07-26: 40 meq via ORAL
  Filled 2012-07-26 (×2): qty 2

## 2012-07-26 MED ORDER — DOPAMINE-DEXTROSE 3.2-5 MG/ML-% IV SOLN
2.0000 ug/kg/min | INTRAVENOUS | Status: DC
  Administered 2012-07-26: 3.35 ug/kg/min via INTRAVENOUS
  Filled 2012-07-26: qty 250

## 2012-07-26 MED ORDER — MUPIROCIN 2 % EX OINT
1.0000 "application " | TOPICAL_OINTMENT | Freq: Two times a day (BID) | CUTANEOUS | Status: AC
  Administered 2012-07-26 – 2012-07-30 (×7): 1 via NASAL
  Filled 2012-07-26: qty 22

## 2012-07-26 MED ORDER — POTASSIUM CHLORIDE CRYS ER 20 MEQ PO TBCR
40.0000 meq | EXTENDED_RELEASE_TABLET | Freq: Once | ORAL | Status: DC
  Filled 2012-07-26: qty 2
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
## 2012-07-26 NOTE — Progress Notes (Signed)
Pt is refusing PICC line placement after repeated advisement by MD and RN about need for PICC line. Explained advantages of PICC and informed of potential complication of peripheral Dopamine. Pt is completely alert and oriented x 4 with GCS 15. Elink  MD and IV Team RN made aware of pt refusal. MD directed to maintain current PIV Dopamine. Line is currently patent, flushing with blood return, and no signs of inflammation, pain, infiltration, or redness.

## 2012-07-26 NOTE — Progress Notes (Signed)
Discussed with PCCM Brett Canales Minor--PCCM managing septic shock and medical care at this point--will take on PCCM service. When stabilizes please call us to assume care. Thank you.  Brendia Sacks, MD [facility] [phone]
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
## 2012-07-26 NOTE — Progress Notes (Signed)
CCM team made aware of patient"s SBP < 90. Order received and carried out for 1L NS Bolus.
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
## 2012-07-26 NOTE — Progress Notes (Signed)
I thoroughly examined his wound with Barnetta Chapel PA. There is not extensive necrotic tissue and I think at least initially bedside debridement and aggressive wound care will be adequate. I do not see that this is an ongoing source of sepsis. He may well benefit from a diverting colostomy ultimately and we had some initial discussion regarding this today.

## 2012-07-26 NOTE — Progress Notes (Signed)
Called by bedside RN.  Patient is hypotensive.  Will start dopamine and hopefully will increase BP enough to get to 90 mmHg inorder to be able to get PICC line.  Total CC time for the day is 35 min.  Alyson Reedy, M.D. [facility] Pulmonary/Critical Care Medicine. Pager: [phone]. After hours pager: [phone].

## 2012-07-26 NOTE — Consult Note (Addendum)
WOC consult Note Reason for Consult:Patient seen per Dr. 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  Other superficial wounds (scrapes, minor abrasions) on LEs, feet were recorded and assessed by nursing staff and are appropriately managed with a moisture retentive dressing. Scar tissue on the patients buttocks indicate a previous surgical intervention (probably a myocutaneous flap) in the past.  Patient does not wish to discuss the details of this intervention. Wound type:Pressure Pressure Ulcer POA: Yes Measurement:Sacral ulcer measures 19 x 14 x 4cm with extensive undermining from 7-9 o'clock measuring 9.5cm at the deepest depth.   Wound bed: Approximately 50% of wound is covered with a grey, yellow slough, 50% is pink/red but not granulating. Drainage (amount, consistency, odor) Mild odor at this time; two dressing changes have occurred since admission with Dakin's solution (1/4 strength). Strong odor from old, saturated dressing.   Periwound: partial thickness areas of tissue loss secondary to tape, scratching are noted. Nursing staff have attempted to cover these with soft silicone foam dressings as able. Dressing procedure/placement/frequency:Dr. Michaell Cowing has ordered twice daily dressing changes with 1/4% sodium hypochlorite solution and I agree with this POC.  I used 1 Kerlix gauze roll to obliterate the dead space and suggest this wound contact layer as it is entirely retrievable.  Nursing staff can trim gauze (excess) to avoid over-filling beyond the skin level.  Top with ABD pad and secure with tape.  I suggest that nursing check ABD pad with a frequency of every 2-4 hours and change this topper dressing in the event of exudate strike-through.  CCS may wish to consider PT for pulsatile lavage once daily and nursing to perform the evening/night dressing change.  If you agree, please order. I also agree that at  this time, NPWT is not indicated-not only due to the location of the ulcer (proximity to rectum) but due to the percentage of necrotic tissue in the wound bed. It is noted by nursing staff and confirmed by the patient that he does not wish to turn off of his back.  Patient understands the role of positioning in the presence and healing of his ulcer and refuses turning and repositioning.  He is pleasant, but insistent.  Patient will require a low air loss sleep surface (Matress Overlay) when he leaves the ICU for the floor. I will remain available to this patient, as well as his nursing and surgical team, but not follow routinely.  Please re-consult if assistance is required in-between my visits. Thanks, Ladona Mow, MSN, RN, [facility], CWOCN [phone])
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
## 2012-07-26 NOTE — Progress Notes (Signed)
*  PRELIMINARY RESULTS* Echocardiogram 2D Echocardiogram has been performed.  [name] [name] 07/26/2012, 12:02 PM

## 2012-07-27 DIAGNOSIS — L8994 Pressure ulcer of unspecified site, stage 4: Secondary | ICD-10-CM

## 2012-07-27 DIAGNOSIS — I959 Hypotension, unspecified: Secondary | ICD-10-CM

## 2012-07-27 DIAGNOSIS — L89109 Pressure ulcer of unspecified part of back, unspecified stage: Secondary | ICD-10-CM

## 2012-07-27 DIAGNOSIS — R6510 Systemic inflammatory response syndrome (SIRS) of non-infectious origin without acute organ dysfunction: Secondary | ICD-10-CM

## 2012-07-27 DIAGNOSIS — N179 Acute kidney failure, unspecified: Secondary | ICD-10-CM

## 2012-07-27 DIAGNOSIS — A419 Sepsis, unspecified organism: Secondary | ICD-10-CM

## 2012-07-27 LAB — TYPE AND SCREEN
ABO/RH(D): O POS
Unit division: 0
Unit division: 0

## 2012-07-27 LAB — BASIC METABOLIC PANEL
BUN: 13 mg/dL (ref 6–23)
CO2: 21 mEq/L (ref 19–32)
Calcium: 7.5 mg/dL — ABNORMAL LOW (ref 8.4–10.5)
GFR calc Af Amer: >90 mL/min (ref >90)
GFR calc non Af Amer: >90 mL/min (ref >90)
GFR calc non Af Amer: >90 mL/min (ref >90)
Glucose, Bld: 123 mg/dL — ABNORMAL HIGH (ref 70–99)
Potassium: 3.5 mEq/L (ref 3.5–5.1)
Potassium: 3.2 mEq/L — ABNORMAL LOW (ref 3.5–5.1)
Sodium: 136 mEq/L (ref 135–145)
Sodium: 136 mEq/L (ref 135–145)

## 2012-07-27 LAB — CBC
Hemoglobin: 8.4 g/dL — ABNORMAL LOW (ref 13.0–17.0)
RBC: 3.1 MIL/uL — ABNORMAL LOW (ref 4.22–5.81)

## 2012-07-27 LAB — MAGNESIUM: Magnesium: 1.4 mg/dL — ABNORMAL LOW (ref 1.5–2.5)

## 2012-07-27 MED ORDER — SODIUM CHLORIDE 0.9 % IV BOLUS (SEPSIS)
750.0000 mL | Freq: Once | INTRAVENOUS | Status: AC
  Administered 2012-07-27: 750 mL via INTRAVENOUS

## 2012-07-27 MED ORDER — SODIUM CHLORIDE 0.9 % IV BOLUS (SEPSIS)
500.0000 mL | Freq: Once | INTRAVENOUS | Status: AC
  Administered 2012-07-27: 500 mL via INTRAVENOUS

## 2012-07-27 MED ORDER — PHENOL 1.4 % MT LIQD
1.0000 | OROMUCOSAL | Status: DC | PRN
  Administered 2012-07-27: 1 via OROMUCOSAL
  Filled 2012-07-27: qty 177

## 2012-07-27 MED ORDER — DOPAMINE HCL 40 MG/ML IV SOLN
2.0000 ug/kg/min | INTRAVENOUS | Status: DC
  Administered 2012-07-27: 4 ug/kg/min via INTRAVENOUS
  Filled 2012-07-27: qty 10

## 2012-07-27 MED ORDER — POTASSIUM CHLORIDE 10 MEQ/100ML IV SOLN
10.0000 meq | INTRAVENOUS | Status: DC
  Administered 2012-07-27: 10 meq via INTRAVENOUS
  Filled 2012-07-27 (×2): qty 100

## 2012-07-27 NOTE — Progress Notes (Signed)
Hypotension   NS fluid bolus ordered.   Hypokalemia   K replaced

## 2012-07-27 NOTE — Progress Notes (Signed)
Pt refused potassium chloride PO, calcium-vitamin D, and gabapentin. MD aware.

## 2012-07-27 NOTE — Progress Notes (Signed)
I sat at patient bedside and listened to him recount his day with me- I felt this would be an excellent way to start the shift and set him at ease. He freely related to me his concerns and I asked him what his wishes were for his care tonight .Together  We outlined a plan of care for him which included his wishes as well as mine ( with his consent of course)Using the AIDET process I was able to help the patient learn about my experience and years as a Engineer, civil (consulting) which seemed to help him  to trust my nursing judgement regarding his care.I told him that I would discuss every aspect of his care with him prior to beginning so as to give him as much autonomy as possible. We talked about his meds  , (which ones he did not want to take and those he did), his dsg. Changes , and turning schedule- I told him that turning would be of help with his multiple decubs but again I told him I would respect his wishes.Will continue to keep lines of communication open, provide education as needed and assist pt. As he allows to recover. Barkley Bruns RN CCRN

## 2012-07-27 NOTE — Progress Notes (Signed)
Arrived to place PICC but patient is nauseated and wants to wait until tomorrow for this procedure.  Notified Staff RN

## 2012-07-27 NOTE — Procedures (Signed)
[name] [name] Feb 10, 1976 [id number]  Pre-op dx: stage 4 sacral decubitus ulcer Post-op dx: same Procedure: Bedside debridement of 3cm squared area of necrotic muscle and skin  Description: Patient was placed on left decubitus and wound was exposed.  An #11 blade scalpel was used to sharply debridement a 3cm squared area of necrotic skin and muscle.  Initially, no bleeding was encountered; however, after further debridement some mildly pink alive tissue was seen.  A small amount of bleeding was noted.  Procedure was then terminated ahead of hydrotherapy starting.  EBL: 2cc Complications: none Dispo: patient tolerated the procedure well with no complaints.  Laying in bed in NAD at end of procedure.  [name] [name] E 11:49 AM 07/27/2012

## 2012-07-27 NOTE — Progress Notes (Signed)
ANTIBIOTIC CONSULT NOTE - FOLLOW UP  Pharmacy Consult for:  Vancomycin and Zosyn Indication: Sacral decubitus ulcer - Stage IV, likely osteomyelitis, sepsis   No Known Allergies  Patient Measurements: Height: 6' 2.5" (189.2 cm) Weight: 175 lb 7.8 oz (79.6 kg) IBW/kg (Calculated) : 83.35   Vital Signs: Temp: 100.1 F (37.8 C) (10/17 0800) BP: 105/66 mmHg (10/17 1600) Pulse Rate: 88  (10/17 1600) Intake/Output from previous day: 10/16 0701 - 10/17 0700 In: 6557.1 [P.O.:840; I.V.:3842.1; Blood:300; IV Piggyback:1575] Out: 3425 [Urine:3425] Intake/Output from this shift: Total I/O In: 1094.4 [I.V.:1094.4] Out: 650 [Urine:650]  Labs:  Basename 07/27/12 1600 07/27/12 0325 07/26/12 1840 07/26/12 0820 07/25/12 1524  WBC -- 13.8* -- 13.2* 19.0*  HGB -- 8.4* 8.0* 6.4* --  PLT -- 290 -- 266 352  LABCREA -- -- -- -- --  CREATININE 0.80 0.95 1.03 -- --   Estimated Creatinine Clearance: 141 ml/min (by C-G formula based on Cr of 0.8).  Basename 07/27/12 1600  VANCOTROUGH 18.0  VANCOPEAK --  VANCORANDOM --  GENTTROUGH --  GENTPEAK --  GENTRANDOM --  TOBRATROUGH --  TOBRAPEAK --  TOBRARND --  AMIKACINPEAK --  AMIKACINTROU --  AMIKACIN --     Microbiology: Recent Results (from the past 720 hour(s))  CULTURE, BLOOD (ROUTINE X 2)     Status: Normal (Preliminary result)   Collection Time   07/25/12  4:10 PM      Component Value Range Status Comment   Specimen Description BLOOD LEFT ARM   Final    Special Requests BOTTLES DRAWN AEROBIC ONLY 3CC   Final    Culture  Setup Time 07/25/2012 22:13   Final    Culture     Final    Value: STAPHYLOCOCCUS AUREUS     Note: RIFAMPIN AND GENTAMICIN SHOULD NOT BE USED AS SINGLE DRUGS FOR TREATMENT OF STAPH INFECTIONS.     Note: Gram Stain Report Called to,Read Back By and Verified With: STEPHANIE DILLON 07/26/12 0830 BY SMITHERSJ   Report Status PENDING   Incomplete   CULTURE, BLOOD (ROUTINE X 2)     Status: Normal (Preliminary result)     Collection Time   07/25/12  6:00 PM      Component Value Range Status Comment   Specimen Description BLOOD LEFT ARM   Final    Special Requests BOTTLES DRAWN AEROBIC ONLY 3CC   Final    Culture  Setup Time 07/25/2012 22:13   Final    Culture     Final    Value:        BLOOD CULTURE RECEIVED NO GROWTH TO DATE CULTURE WILL BE HELD FOR 5 DAYS BEFORE ISSUING A FINAL NEGATIVE REPORT   Report Status PENDING   Incomplete   URINE CULTURE     Status: Normal (Preliminary result)   Collection Time   07/25/12  6:50 PM      Component Value Range Status Comment   Specimen Description URINE, RANDOM   Final    Special Requests NONE   Final    Culture  Setup Time 07/26/2012 01:47   Final    Colony Count PENDING   Incomplete    Culture Culture reincubated for better growth   Final    Report Status PENDING   Incomplete   WOUND CULTURE     Status: Normal (Preliminary result)   Collection Time   07/25/12  8:10 PM      Component Value Range Status Comment   Specimen Description SACRAL  Final    Special Requests Normal   Final    Gram Stain     Final    Value: MODERATE WBC PRESENT,BOTH PMN AND MONONUCLEAR     NO SQUAMOUS EPITHELIAL CELLS SEEN     RARE GRAM NEGATIVE RODS     RARE GRAM POSITIVE RODS     RARE GRAM POSITIVE COCCI IN PAIRS   Culture     Final    Value: FEW STAPHYLOCOCCUS AUREUS     Note: RIFAMPIN AND GENTAMICIN SHOULD NOT BE USED AS SINGLE DRUGS FOR TREATMENT OF STAPH INFECTIONS.   Report Status PENDING   Incomplete   MRSA PCR SCREENING     Status: Abnormal   Collection Time   07/25/12  8:13 PM      Component Value Range Status Comment   MRSA by PCR POSITIVE (*) NEGATIVE Final     Anti-infectives     Start     Dose/Rate Route Frequency Ordered Stop   07/26/12 0500   vancomycin (VANCOCIN) 750 mg in sodium chloride 0.9 % 150 mL IVPB        750 mg 150 mL/hr over 60 Minutes Intravenous Every 12 hours 07/25/12 1958     07/26/12 0200  piperacillin-tazobactam (ZOSYN) IVPB 3.375 g        3.375 g 12.5 mL/hr over 240 Minutes Intravenous Every 8 hours 07/25/12 1958     07/25/12 1700   vancomycin (VANCOCIN) IVPB 1000 mg/200 mL premix        1,000 mg 200 mL/hr over 60 Minutes Intravenous  Once 07/25/12 1619 07/25/12 1807   07/25/12 1630   piperacillin-tazobactam (ZOSYN) IVPB 3.375 g  Status:  Discontinued        3.375 g 12.5 mL/hr over 240 Minutes Intravenous  Once 07/25/12 1619 07/25/12 1938          Assessment:  Assisting with antibiotic therapy for this 36 year-old male with multiple problems including paraplegia, Stage IV sacral decubitus and likely osteomyelitis, Staph aureus bacteremia, SIRS, and acute renal failure - resolving.  The current Vancomycin dose is 750 mg IV every 12 hours.  The Vancomycin trough level drawn at 1600 today is reported as 18.0 mcg/ml.  This level is within the therapeutic range.  Goal of Therapy:   Vancomycin trough levels 15-20 mcg/ml  Eradication of infection  Plan:   Continue Vancomycin and Zosyn as currently ordered  Follow cultures for sensitivities  [employer] R.Ph. 07/27/2012,5:06 PM
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
## 2012-07-27 NOTE — Progress Notes (Addendum)
Care Coordinator rounding on patient. He is upset that medical records have not been obtained from [facility] facility where [facility] has been treating the sacral stage IV ulcer. He states he runs a low BP. He does not want "a line in my neck because I'm scared of that." He also refuses a PICC. This CC explained in detail the concerns of running Dopamine through a PIV site, the need for a central line or PICC, the purpose of the CVL and the many uses for it. The patient stated, "It's just too much" to comprehend at this time.The patient became tearful and reached for my hand at this point. I feel he would benefit from a psych consult to help with anxiety, mood and generalized adjustment to the new environment outside prison. I will talk with Brett Canales Minor NP for CCM. Spoke with S. Minor NP. At this time medication to help reduce anxiety would lower BP too much. Will continue to monitor PIV/ Dopamine infusion. S. Minor NP will talk with pt again. Will have pt sign a release for [facility] records.
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
## 2012-07-28 ENCOUNTER — Inpatient Hospital Stay (HOSPITAL_COMMUNITY): Payer: Medicaid Other

## 2012-07-28 LAB — BASIC METABOLIC PANEL
BUN: 12 mg/dL (ref 6–23)
CO2: 21 mEq/L (ref 19–32)
Calcium: 7.2 mg/dL — ABNORMAL LOW (ref 8.4–10.5)
Creatinine, Ser: 0.76 mg/dL (ref 0.50–1.35)
GFR calc non Af Amer: >90 mL/min (ref >90)
Glucose, Bld: 125 mg/dL — ABNORMAL HIGH (ref 70–99)

## 2012-07-28 LAB — CBC
MCH: 27.5 pg (ref 26.0–34.0)
MCHC: 32.8 g/dL (ref 30.0–36.0)
MCV: 84 fL (ref 78.0–100.0)
Platelets: 239 10*3/uL (ref 150–400)
RDW: 17.5 % — ABNORMAL HIGH (ref 11.5–15.5)
WBC: 12.5 10*3/uL — ABNORMAL HIGH (ref 4.0–10.5)

## 2012-07-28 LAB — WOUND CULTURE: Special Requests: NORMAL

## 2012-07-28 LAB — CULTURE, BLOOD (ROUTINE X 2)

## 2012-07-28 MED ORDER — POTASSIUM CHLORIDE CRYS ER 20 MEQ PO TBCR
40.0000 meq | EXTENDED_RELEASE_TABLET | Freq: Once | ORAL | Status: AC
  Administered 2012-07-28: 30 meq via ORAL
  Filled 2012-07-28: qty 2

## 2012-07-28 MED ORDER — MAGNESIUM SULFATE 40 MG/ML IJ SOLN
2.0000 g | Freq: Once | INTRAMUSCULAR | Status: AC
  Administered 2012-07-28: 2 g via INTRAVENOUS
  Filled 2012-07-28: qty 50

## 2012-07-28 MED ORDER — PROMETHAZINE HCL 25 MG/ML IJ SOLN
12.5000 mg | Freq: Four times a day (QID) | INTRAMUSCULAR | Status: DC | PRN
  Administered 2012-07-28 – 2012-08-12 (×4): 12.5 mg via INTRAVENOUS
  Filled 2012-07-28 (×4): qty 1

## 2012-07-28 MED ORDER — POTASSIUM CHLORIDE CRYS ER 20 MEQ PO TBCR: 40.0000 meq | EXTENDED_RELEASE_TABLET | Freq: Once | ORAL | Status: DC

## 2012-07-28 MED ORDER — IOHEXOL 300 MG/ML  SOLN
100.0000 mL | Freq: Once | INTRAMUSCULAR | Status: AC | PRN
  Administered 2012-07-28: 100 mL via INTRAVENOUS

## 2012-07-28 MED ORDER — KCL IN DEXTROSE-NACL 40-5-0.9 MEQ/L-%-% IV SOLN
INTRAVENOUS | Status: DC
  Administered 2012-07-28: 12:00:00 via INTRAVENOUS
  Filled 2012-07-28 (×2): qty 1000
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
## 2012-07-28 NOTE — Progress Notes (Signed)
Patient ID: [name] [name], male   DOB: 1976/04/05, 36 y.o.   MRN: [id number]    Subjective: Pt "emotional" today.  Otherwise no complaints  Objective: Vital signs in last 24 hours: Temp:  [97.5 F (36.4 C)-98.7 F (37.1 C)] 98.7 F (37.1 C) (10/18 0000) Pulse Rate:  [57-102] 57  (10/18 0200) Resp:  [8-21] 11  (10/18 1000) BP: (83-105)/(35-66) 86/46 mmHg (10/18 1000) SpO2:  [94 %-100 %] 97 % (10/18 0200) Last BM Date:  (PTA)  Intake/Output from previous day: 10/17 0701 - 10/18 0700 In: 4163 [P.O.:240; I.V.:3613; IV Piggyback:310] Out: 1175 [Urine:1175] Intake/Output this shift: Total I/O In: 751.9 [P.O.:240; I.V.:476.9; IV Piggyback:35] Out: 550 [Urine:550]  PE: Skin: wound is stable.  First session of hydrotherapy yesterday actually debrided away a slight amount of necrosis!  Lab Results:   Basename 07/28/12 0308 07/27/12 0325  WBC 12.5* 13.8*  HGB 7.9* 8.4*  HCT 24.1* 25.7*  PLT 239 290   BMET  Basename 07/28/12 0308 07/27/12 1600  NA 134* 136  K 2.9* 3.2*  CL 106 108  CO2 21 19  GLUCOSE 125* 103*  BUN 12 13  CREATININE 0.76 0.80  CALCIUM 7.2* 7.5*   PT/INR No results found for this basename: LABPROT:2,INR:2 in the last 72 hours CMP     Component Value Date/Time   NA 134* 07/28/2012 0308   K 2.9* 07/28/2012 0308   CL 106 07/28/2012 0308   CO2 21 07/28/2012 0308   GLUCOSE 125* 07/28/2012 0308   BUN 12 07/28/2012 0308   CREATININE 0.76 07/28/2012 0308   CALCIUM 7.2* 07/28/2012 0308   PROT 7.7 07/25/2012 1524   ALBUMIN 1.3* 07/25/2012 1524   AST 32 07/25/2012 1524   ALT 12 07/25/2012 1524   ALKPHOS 170* 07/25/2012 1524   BILITOT 0.7 07/25/2012 1524   GFRNONAA >90 07/28/2012 0308   GFRAA >90 07/28/2012 0308   Lipase  No results found for this basename: lipase       Studies/Results: No results found.  Anti-infectives: Anti-infectives     Start     Dose/Rate Route Frequency Ordered Stop   07/26/12 0500   vancomycin (VANCOCIN) 750 mg in  sodium chloride 0.9 % 150 mL IVPB  Status:  Discontinued        750 mg 150 mL/hr over 60 Minutes Intravenous Every 12 hours 07/25/12 1958 07/28/12 1033   07/26/12 0200  piperacillin-tazobactam (ZOSYN) IVPB 3.375 g       3.375 g 12.5 mL/hr over 240 Minutes Intravenous Every 8 hours 07/25/12 1958     07/25/12 1700   vancomycin (VANCOCIN) IVPB 1000 mg/200 mL premix        1,000 mg 200 mL/hr over 60 Minutes Intravenous  Once 07/25/12 1619 07/25/12 1807   07/25/12 1630   piperacillin-tazobactam (ZOSYN) IVPB 3.375 g  Status:  Discontinued        3.375 g 12.5 mL/hr over 240 Minutes Intravenous  Once 07/25/12 1619 07/25/12 1938           Assessment/Plan  1. Staph bacteremia 2. Stage 4 sacral decub 3. 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 Paraplegia  Plan: 1. Cont hydrotherapy to wound daily.  May need more debridement, will determine early next week. 2. Will need diverting colostomy at some point next week as well. 3. Cont BID dressing changes for now   LOS: 3 days    OSBORNE,KELLY E 07/28/2012, 10:37 AM Pager: [phone]  I have seen and examined the wound.  He still has some brownish,  probably dead fat at the left side of the wound at the rectal level.  This does not appear infected and there is no purulence or erythema.  I think that there are a few areas which might benefit from debridement but I do not think that these are driving his sepsis.  He would probably benefit from diverting ostomy and he might be able to have some additional debridement at the same time if this tissue does not debride with wound care.  He is interested in an ostomy and this will be considered after his sepsis improves.  I would also recommend a plastic surgery evaluation for possible flap closure after diverting ostomy done.
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
## 2012-07-28 NOTE — Progress Notes (Signed)
Pt refused am dsg change at 5-6am stating" they are going to debride it this morning ". Will relate this to day shift RN. Barkley Bruns RN CCRN

## 2012-07-28 NOTE — Progress Notes (Signed)
[facility]    Date of Admission:  07/25/2012   Total days of antibiotics 4        Day 4 vancomycin        Day 4 piptazo           ID: [name] [name] is a 36 y.o. male with 36 yo T10 paraplegic with stage IV sacral decub with likely osteomyelitis & MSSA bacteremia    Principal Problem:  *SIRS (systemic inflammatory response syndrome) Active Problems:  Sacral decubitus ulcer, stage IV  Paraplegia following T10spinal cord injury  Anemia  Cardiomyopathy  Hyperkalemia  Acute renal failure  Sepsis  Septic shock(785.52)    Subjective: Afebrile. Having occ back pain due to spasms  Medications:     . calcium-vitamin D  2 tablet Oral BID  . Chlorhexidine Gluconate Cloth  6 each Topical Q0600  . feeding supplement  237 mL Oral BID BM  . feeding supplement  1 Container Oral BID BM  . ferrous gluconate  324 mg Oral BID  . gabapentin  900 mg Oral TID  . magnesium sulfate 1 - 4 g bolus IVPB  2 g Intravenous Once  . methadone  5 mg Oral BID  . multivitamin with minerals  1 tablet Oral Daily  . mupirocin ointment  1 application Nasal BID  . oxybutynin  5 mg Oral TID  . piperacillin-tazobactam (ZOSYN)  IV  3.375 g Intravenous Q8H  . potassium chloride  40 mEq Oral Once  . potassium chloride  40 mEq Oral Once  . simvastatin  40 mg Oral QHS  . sodium hypochlorite   Irrigation Q12H  . DISCONTD: pantoprazole (PROTONIX) IV  40 mg Intravenous Q12H  . DISCONTD: vancomycin  750 mg Intravenous Q12H    Objective: Vital signs in last 24 hours: Temp:  [97.3 F (36.3 C)-98.7 F (37.1 C)] 97.3 F (36.3 C) (10/18 0800) Pulse Rate:  [57-102] 57  (10/18 0200) Resp:  [8-21] 11  (10/18 1000) BP: (86-105)/(35-66) 86/46 mmHg (10/18 1000) SpO2:  [94 %-100 %] 97 % (10/18 0200) BP 86/46  Pulse 57  Temp 97.3 F (36.3 C) (Oral)  Resp 11  Ht 6' 2.5" (1.892 m)  Wt 175 lb 7.8 oz (79.6 kg)  BMI 22.23 kg/m2  SpO2 97% Physical Exam  Constitutional: He is  oriented to person, place, and time. He appears well-developed and well-nourished. No distress.  HENT:  Mouth/Throat: Oropharynx is clear and moist. No oropharyngeal exudate.  Cardiovascular: Normal rate, regular rhythm and normal heart sounds. Exam reveals no gallop and no friction rub.  No murmur heard.  Pulmonary/Chest: Effort normal and breath sounds normal. No respiratory distress. He has no wheezes.  Abdominal: Soft. Bowel sounds are normal. He exhibits no distension. There is no tenderness.  Lymphadenopathy:  no cervical adenopathy.  Buttock: wound covered and packed,  Neurological: flaccid lower extremities  Skin: Skin is warm and dry. Abrasions to knees are healing     Lab Results  Basename 07/28/12 0308 07/27/12 1600 07/27/12 0325  WBC 12.5* -- 13.8*  HGB 7.9* -- 8.4*  HCT 24.1* -- 25.7*  NA 134* 136 --  K 2.9* 3.2* --  CL 106 108 --  CO2 21 19 --  BUN 12 13 --  CREATININE 0.76 0.80 --  GLU -- -- --   Liver Panel  Basename 07/25/12 1524  PROT 7.7  ALBUMIN 1.3*  AST 32  ALT 12  ALKPHOS 170*  BILITOT 0.7  BILIDIR --  IBILI --   Sedimentation Rate  Basename 07/27/12 1606  ESRSEDRATE 134*   C-Reactive Protein  Basename 07/27/12 1606 07/25/12 1610  CRP 15.1* 17.9*    Microbiology: 10/17 blood cx 2/2 PENDING 10/15 blood cx 1/2 MSSA ( oxa S) 10/15 wound cx few MSSA but g/s shows GNR, GPR, GPC  10/15 urine cx: re-incubating  Studies/Results: No results found.   Assessment/Plan: 36 yo T10 paraplegic with stage IV sacral decub with likely osteomyelitis & MSSA bacteremia   MSSA/Staph aureus bacteremia likely related to decubitus ulcer = continue  Piptazo. Can discontinue vancomycin since no MRSA isolated. Will repeat blood cultures today to document clearance of bacteremia. Do not place picc line until we can prove he no longer has bacteremia, based upon blood cultures from 10/17.  Cardiac device = patient underwent 2D Echo on 10/16 that did not  visualize any vegetations. Once patient is stabilized, recommend to get TEE to ensure no vegetations on leads   Presumed pelvis osteomyelitis 2/2 sacral decub = will order pelvic CT to see if any surgical debridement is needed. Recommend to get deep tissue culture.  Agree with getting diverting colostomy to help with wound healing. Will need to discuss with surgery when this can occur.  Possible uti = await urine culture results since it has isolated GNR 10 Pinkie.Stagger.   Drue Second [facility] Cell: [phone] Pager: [phone]  07/28/2012, 12:50 PM

## 2012-07-29 LAB — CBC
Hemoglobin: 7.6 g/dL — ABNORMAL LOW (ref 13.0–17.0)
MCHC: 32.3 g/dL (ref 30.0–36.0)
RDW: 17.5 % — ABNORMAL HIGH (ref 11.5–15.5)

## 2012-07-29 LAB — URINE CULTURE: Colony Count: >=100000

## 2012-07-29 LAB — BASIC METABOLIC PANEL
GFR calc Af Amer: >90 mL/min (ref >90)
GFR calc non Af Amer: >90 mL/min (ref >90)
Potassium: 3.3 mEq/L — ABNORMAL LOW (ref 3.5–5.1)
Sodium: 134 mEq/L — ABNORMAL LOW (ref 135–145)

## 2012-07-29 LAB — MAGNESIUM: Magnesium: 1.4 mg/dL — ABNORMAL LOW (ref 1.5–2.5)

## 2012-07-29 MED ORDER — POTASSIUM CHLORIDE 10 MEQ/100ML IV SOLN
10.0000 meq | INTRAVENOUS | Status: AC
  Administered 2012-07-29 (×2): 10 meq via INTRAVENOUS
  Filled 2012-07-29: qty 200

## 2012-07-29 MED ORDER — SODIUM CHLORIDE 0.9 % IV BOLUS (SEPSIS)
500.0000 mL | Freq: Once | INTRAVENOUS | Status: AC
  Administered 2012-07-29: 500 mL via INTRAVENOUS

## 2012-07-29 MED ORDER — MAGNESIUM SULFATE IN D5W 10-5 MG/ML-% IV SOLN
1.0000 g | Freq: Once | INTRAVENOUS | Status: AC
  Administered 2012-07-29: 1 g via INTRAVENOUS
  Filled 2012-07-29: qty 100

## 2012-07-29 MED ORDER — NYSTATIN 100000 UNIT/ML MT SUSP
5.0000 mL | Freq: Four times a day (QID) | OROMUCOSAL | Status: DC
  Administered 2012-07-29 – 2012-08-21 (×71): 500000 [IU] via ORAL
  Filled 2012-07-29 (×97): qty 5

## 2012-07-29 MED ORDER — POTASSIUM CHLORIDE IN NACL 40-0.9 MEQ/L-% IV SOLN
INTRAVENOUS | Status: DC
  Administered 2012-07-29 – 2012-07-31 (×2): via INTRAVENOUS
  Administered 2012-07-31: 50 mL/h via INTRAVENOUS
  Administered 2012-08-01: 14:00:00 via INTRAVENOUS
  Filled 2012-07-29 (×7): qty 1000

## 2012-07-29 MED ORDER — MAGNESIUM OXIDE 400 (241.3 MG) MG PO TABS
400.0000 mg | ORAL_TABLET | Freq: Two times a day (BID) | ORAL | Status: DC
  Administered 2012-07-29 – 2012-08-10 (×22): 400 mg via ORAL
  Filled 2012-07-29 (×28): qty 1

## 2012-07-29 MED ORDER — SODIUM CHLORIDE 0.9 % IV SOLN
1.0000 g | Freq: Three times a day (TID) | INTRAVENOUS | Status: DC
  Administered 2012-07-29 – 2012-08-03 (×18): 1 g via INTRAVENOUS
  Filled 2012-07-29 (×19): qty 1

## 2012-07-29 MED ORDER — K PHOS MONO-SOD PHOS DI & MONO 155-852-130 MG PO TABS
500.0000 mg | ORAL_TABLET | Freq: Three times a day (TID) | ORAL | Status: DC
  Administered 2012-07-29 – 2012-08-16 (×24): 500 mg via ORAL
  Filled 2012-07-29 (×72): qty 2

## 2012-07-29 NOTE — Progress Notes (Signed)
[facility]    Subjective: No new complaints   Antibiotics:  Anti-infectives     Start     Dose/Rate Route Frequency Ordered Stop   07/29/12 0100   meropenem (MERREM) 1 g in sodium chloride 0.9 % 100 mL IVPB        1 g 200 mL/hr over 30 Minutes Intravenous 3 times per day 07/29/12 0050     07/26/12 0500   vancomycin (VANCOCIN) 750 mg in sodium chloride 0.9 % 150 mL IVPB  Status:  Discontinued        750 mg 150 mL/hr over 60 Minutes Intravenous Every 12 hours 07/25/12 1958 07/28/12 1033   07/26/12 0200   piperacillin-tazobactam (ZOSYN) IVPB 3.375 g  Status:  Discontinued        3.375 g 12.5 mL/hr over 240 Minutes Intravenous Every 8 hours 07/25/12 1958 07/29/12 0043   07/25/12 1700   vancomycin (VANCOCIN) IVPB 1000 mg/200 mL premix        1,000 mg 200 mL/hr over 60 Minutes Intravenous  Once 07/25/12 1619 07/25/12 1807   07/25/12 1630   piperacillin-tazobactam (ZOSYN) IVPB 3.375 g  Status:  Discontinued        3.375 g 12.5 mL/hr over 240 Minutes Intravenous  Once 07/25/12 1619 07/25/12 1938          Medications: Scheduled Meds:   . calcium-vitamin D  2 tablet Oral BID  . Chlorhexidine Gluconate Cloth  6 each Topical Q0600  . feeding supplement  237 mL Oral BID BM  . feeding supplement  1 Container Oral BID BM  . ferrous gluconate  324 mg Oral BID  . gabapentin  900 mg Oral TID  . magnesium oxide  400 mg Oral BID  . magnesium sulfate 1 - 4 g bolus IVPB  1 g Intravenous Once  . magnesium sulfate 1 - 4 g bolus IVPB  2 g Intravenous Once  . meropenem (MERREM) IV  1 g Intravenous Q8H  . methadone  5 mg Oral BID  . multivitamin with minerals  1 tablet Oral Daily  . mupirocin ointment  1 application Nasal BID  . nystatin  5 mL Oral QID  . oxybutynin  5 mg Oral TID  . phosphorus  500 mg Oral TID  . potassium chloride  10 mEq Intravenous Q1 Hr x 2  . potassium chloride  10 mEq Intravenous Q1 Hr x 2  . simvastatin  40 mg Oral QHS  . sodium  chloride  500 mL Intravenous Once  . DISCONTD: piperacillin-tazobactam (ZOSYN)  IV  3.375 g Intravenous Q8H  . DISCONTD: potassium chloride  40 mEq Oral Once   Continuous Infusions:   . 0.9 % NaCl with KCl 40 mEq / L 50 mL/hr at 07/29/12 1008  . DOPamine Stopped (07/27/12 1330)  . DOPamine NICU IV Infusion 1600 mcg/mL <1.5 kg ([place]) 3 mcg/kg/min (07/28/12 0900)  . DISCONTD: dextrose 5 % and 0.9 % NaCl with KCl 40 mEq/L 50 mL/hr at 07/28/12 1144   PRN Meds:.acetaminophen, acetaminophen, HYDROcodone-acetaminophen, HYDROmorphone (DILAUDID) injection, iohexol, ondansetron (ZOFRAN) IV, ondansetron, phenol, promethazine   Objective: Weight change:   Intake/Output Summary (Last 24 hours) at 07/29/12 1219 Last data filed at 07/29/12 1100  Gross per 24 hour  Intake   3064 ml  Output   2050 ml  Net   1014 ml   Blood pressure 98/52, pulse 57, temperature 98.3 F (36.8 C), temperature source Oral, resp. rate 19, height 6' 2.5" (  1.892 m), weight 175 lb 7.8 oz (79.6 kg), SpO2 99.00%. Temp:  [97.8 F (36.6 C)-98.8 F (37.1 C)] 98.3 F (36.8 C) (10/19 0800) Resp:  [11-19] 19  (10/19 0800) BP: (82-106)/(36-63) 98/52 mmHg (10/19 0800) SpO2:  [99 %-100 %] 99 % (10/19 0800)  Physical Exam: General: Alert and awake, oriented x3, not in any acute distress. HEENT: anicteric sclera, pupils reactive to light and accommodation, EOMI CVS regular rate, normal r,  no murmur rubs or gallops, device pocket site is CDI not warm Chest: clear to auscultation bilaterally, no wheezing, rales or rhonchi Abdomen: soft  HE has no sensation below waist.    Lab Results:  Basename 07/29/12 0110 07/28/12 0308  WBC 8.6 12.5*  HGB 7.6* 7.9*  HCT 23.5* 24.1*  PLT 247 239    BMET  Basename 07/29/12 0110 07/28/12 0308  NA 134* 134*  K 3.3* 2.9*  CL 107 106  CO2 19 21  GLUCOSE 85 125*  BUN 9 12  CREATININE 0.66 0.76  CALCIUM 7.3* 7.2*    Micro Results: Recent Results (from the past 240 hour(s))    CULTURE, BLOOD (ROUTINE X 2)     Status: Normal   Collection Time   07/25/12  4:10 PM      Component Value Range Status Comment   Specimen Description BLOOD LEFT ARM   Final    Special Requests BOTTLES DRAWN AEROBIC ONLY 3CC   Final    Culture  Setup Time 07/25/2012 22:13   Final    Culture     Final    Value: STAPHYLOCOCCUS AUREUS     Note: RIFAMPIN AND GENTAMICIN SHOULD NOT BE USED AS SINGLE DRUGS FOR TREATMENT OF STAPH INFECTIONS.     Note: Gram Stain Report Called to,Read Back By and Verified With: STEPHANIE DILLON 07/26/12 0830 BY SMITHERSJ   Report Status 07/28/2012 FINAL   Final    Organism ID, Bacteria STAPHYLOCOCCUS AUREUS   Final   CULTURE, BLOOD (ROUTINE X 2)     Status: Normal (Preliminary result)   Collection Time   07/25/12  6:00 PM      Component Value Range Status Comment   Specimen Description BLOOD LEFT ARM   Final    Special Requests BOTTLES DRAWN AEROBIC ONLY 3CC   Final    Culture  Setup Time 07/25/2012 22:13   Final    Culture     Final    Value:        BLOOD CULTURE RECEIVED NO GROWTH TO DATE CULTURE WILL BE HELD FOR 5 DAYS BEFORE ISSUING A FINAL NEGATIVE REPORT   Report Status PENDING   Incomplete   URINE CULTURE     Status: Normal   Collection Time   07/25/12  6:50 PM      Component Value Range Status Comment   Specimen Description URINE, RANDOM   Final    Special Requests NONE   Final    Culture  Setup Time 07/26/2012 01:47   Final    Colony Count >=100,000 COLONIES/ML   Final    Culture     Final    Value: ESCHERICHIA COLI     Note: Confirmed Extended Spectrum Beta-Lactamase Producer (ESBL) CRITICAL RESULT CALLED TO, READ BACK BY AND VERIFIED WITH: VANCE JOHNSON @ 0025 ON 07/29/2012 HAJAM   Report Status 07/29/2012 FINAL   Final    Organism ID, Bacteria ESCHERICHIA COLI   Final   WOUND CULTURE     Status: Normal   Collection Time  07/25/12  8:10 PM      Component Value Range Status Comment   Specimen Description SACRAL   Final    Special Requests  Normal   Final    Gram Stain     Final    Value: MODERATE WBC PRESENT,BOTH PMN AND MONONUCLEAR     NO SQUAMOUS EPITHELIAL CELLS SEEN     RARE GRAM NEGATIVE RODS     RARE GRAM POSITIVE RODS     RARE GRAM POSITIVE COCCI IN PAIRS   Culture     Final    Value: FEW STAPHYLOCOCCUS AUREUS     Note: RIFAMPIN AND GENTAMICIN SHOULD NOT BE USED AS SINGLE DRUGS FOR TREATMENT OF STAPH INFECTIONS.   Report Status 07/28/2012 FINAL   Final    Organism ID, Bacteria STAPHYLOCOCCUS AUREUS   Final   MRSA PCR SCREENING     Status: Abnormal   Collection Time   07/25/12  8:13 PM      Component Value Range Status Comment   MRSA by PCR POSITIVE (*) NEGATIVE Final   CULTURE, BLOOD (ROUTINE X 2)     Status: Normal (Preliminary result)   Collection Time   07/27/12  3:47 PM      Component Value Range Status Comment   Specimen Description BLOOD LEFT ARM   Final    Special Requests BOTTLES DRAWN AEROBIC ONLY 5CC   Final    Culture  Setup Time 07/27/2012 22:30   Final    Culture     Final    Value:        BLOOD CULTURE RECEIVED NO GROWTH TO DATE CULTURE WILL BE HELD FOR 5 DAYS BEFORE ISSUING A FINAL NEGATIVE REPORT   Report Status PENDING   Incomplete   CULTURE, BLOOD (ROUTINE X 2)     Status: Normal (Preliminary result)   Collection Time   07/27/12  4:00 PM      Component Value Range Status Comment   Specimen Description BLOOD LEFT ARM   Final    Special Requests BOTTLES DRAWN AEROBIC ONLY 3CC   Final    Culture  Setup Time 07/27/2012 22:30   Final    Culture     Final    Value:        BLOOD CULTURE RECEIVED NO GROWTH TO DATE CULTURE WILL BE HELD FOR 5 DAYS BEFORE ISSUING A FINAL NEGATIVE REPORT   Report Status PENDING   Incomplete     Studies/Results: Ct Abdomen Pelvis W Contrast  07/28/2012  *RADIOLOGY REPORT*  Clinical Data: Sacral osteomyelitis.  CT ABDOMEN AND PELVIS WITH CONTRAST  Technique:  Multidetector CT imaging of the abdomen and pelvis was performed following the standard protocol during  bolus administration of intravenous contrast.  Contrast: OMNIPAQUE IOHEXOL 300 MG/ML  SOLN  Comparison: CT scan of the lumbar spine dated 07/25/2012  Findings: There are small bilateral pleural effusions and a small pericardial effusion.  The liver, spleen, pancreas, gallbladder, adrenal glands, and left kidney are normal.  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  The patient has a left inguinal hernia containing sigmoid colon.  The hernia extends into the left side of the scrotum and is surrounded by fluid.  There is a deep left buttock decubitus ulcer that extends to the left ischial tuberosity with osteomyelitis of the left inferior pubic ramus and distal sacrum.  There is a prominent left hip effusion which is probably infected.  There is extensive soft tissue edema in the area of the decubitus ulcer.  There is a small amount of fluid in the pelvis extending along the inferior aspect of the left pericolic gutter, nonspecific.  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  IMPRESSION:  1.  Prominent left hip effusion, probably infected secondary to the adjacent large decubitus ulcer of the left buttock. 2.  Large left inguinal hernia containing the sigmoid colon.  No evidence of obstruction. 3.  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 3. Cellulitis around the decubitus ulcer and probably in the right buttock as well.   Original Report Authenticated By: Gwynn Burly, M.D.       Assessment/Plan: [name] [name] is a 36 y.o. male with  MSSA bacteremia WITH AICD, chronic decubitus ulcer, now newly diagnosed hip effusion and ESBL in urine  # 1 methicillin sensitive staph aureus bacteremia: I am very concerned about his device given his methicillin sensitive staph aureus bacteremia. -- He will need a transesophageal echocardiogram performed at [facility] to exclude vegetations on the device wires, and we still may need to consider removal of the device  regardless. -- Certainly other sources (besides the ulcer) of infection or of metastatic spread need to be sought -- His hip effusion is concerning and I think it should be aspirated for cell count and culture.  --Repeat blood cultures are incubating to document clearance. --the meropenem he is currently on will cover his methicillin sensitive staph aureus quite well want to narrow him to cefazolin soon.  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 I do agree with multidisciplinary care to treat his ulcer and I agree with a diverting colostomy to protect the ulcer.  #3 extended spectrum beta-lactamase in the urine: Because of his organism is unclear given the fact that he has been bacteremic with methicillin sensitive staph aureus and that seemed like a more likely cause of his illness. However for now I don't think it is unreasonable to continue coverage of this organism.  #4 screening : HIV antibody      LOS: 4 days   Acey Lav 07/29/2012, 12:19 PM

## 2012-07-29 NOTE — Progress Notes (Signed)
CRITICAL VALUE ALERT  Critical value received:  ESBL in Urine Culture  Date of notification:  07/29/2012  Time of notification:  0020  Critical value read back:yes  Nurse who received alert:  Vernon Prey  MD notified (1st page):  Loraine Leriche, RN at [facility] for Dr. Frederico Hamman  Time of first page: Devra Dopp at 0025  MD notified (2nd page):  Time of second page:  Responding MD:    Time MD responded:

## 2012-07-29 NOTE — Progress Notes (Signed)
Hydrotherapy Note 07/29/12 1100  Wound 07/27/12 Sacrum Posterior;Lower pressure ulcer with bone exposed  Date First Assessed/Time First Assessed: 07/27/12 1130   Location: Sacrum  Location Orientation: Posterior;Lower  Wound Description (Comments): pressure ulcer with bone exposed  Present on Admission: Yes  Site / Wound Assessment Granulation tissue;Pink;Black;Brown  % Wound base Red or Granulating 50%  % Wound base Yellow 30%  % Wound base Black 5%  % Wound base Other (Comment) 15% (BONE)  Peri-wound Assessment Excoriated  Margins Unattacted edges (unapproximated)  Drainage Amount Copious  Drainage Description Purulent;Odor (dressing changed ealier , drainage on pad )  Non-staged Wound Description Full thickness  Treatment Cleansed;Debridement (Selective);Hydrotherapy (Pulse lavage);Packing (Saline gauze)  Dressing Type ABD;Gauze (Comment);Moist to dry;Tape dressing  Dressing Changed Changed  Dressing Status Clean  Hydrotherapy  Pulsed Lavage with Suction (psi) 8 psi (varied from 4-12 depending on necrotic thickness)  Pulsed Lavage with Suction - Normal Saline Used 1000 mL  Pulsed Lavage Tip Tip with splash shield  Pulsed lavage therapy - wound location sacrum, posterior thighs, perineum  Wound Therapy - Assess/Plan/Recommendations  Wound Therapy - Clinical Statement Pt tolerated session well today.  Had lower body muscle spasms today that prevented too much selective debridement after pulsed lavage.   Wound Therapy Goals - Improve the function of patient's integumentary system by progressing the wound(s) through the phases of wound healing by:  Decrease Necrotic Tissue - Progress Progressing toward goal  Increase Granulation Tissue - Progress Progressing toward goal   Marella Bile, PT Pager: [phone] 07/29/2012

## 2012-07-29 NOTE — Progress Notes (Signed)
Nursing staff received a call from the microbiology lab; the patient has ESBL GNR in urine.   We will change the Zosyn to [facility].

## 2012-07-29 NOTE — Progress Notes (Signed)
Briefly discussed pt with Dr. Daiva Eves as it was felt he may require TEE for bacteremia in setting of ICD. However, before formally setting up for Monday, we decided it would be best to have the patient evaluated Monday morning for fitness for TEE. I will place order for NPO after midnight - if he is deemed fit for TEE, please let us know. Dayna Dunn PA-C

## 2012-07-29 NOTE — Progress Notes (Signed)
Hypokalemia and hypomagnesemia   K and Mg replaced  
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
## 2012-07-29 NOTE — Progress Notes (Signed)
Dr. Frederico Hamman from [facility], notified RN of low BP, BP checked as documented, remains low, NS IV bolus started per Dr. Liliane Channel order.

## 2012-07-30 ENCOUNTER — Inpatient Hospital Stay (HOSPITAL_COMMUNITY): Payer: Medicaid Other

## 2012-07-30 LAB — CBC
MCH: 27.2 pg (ref 26.0–34.0)
MCHC: 32.3 g/dL (ref 30.0–36.0)
Platelets: 259 10*3/uL (ref 150–400)
RDW: 18.3 % — ABNORMAL HIGH (ref 11.5–15.5)

## 2012-07-30 LAB — BASIC METABOLIC PANEL
BUN: 5 mg/dL — ABNORMAL LOW (ref 6–23)
Calcium: 7.2 mg/dL — ABNORMAL LOW (ref 8.4–10.5)
Creatinine, Ser: 0.58 mg/dL (ref 0.50–1.35)
GFR calc non Af Amer: >90 mL/min (ref >90)
Glucose, Bld: 86 mg/dL (ref 70–99)

## 2012-07-30 LAB — HIV ANTIBODY (ROUTINE TESTING W REFLEX): HIV: NONREACTIVE

## 2012-07-30 MED ORDER — SODIUM CHLORIDE 0.9 % IJ SOLN
10.0000 mL | INTRAMUSCULAR | Status: DC | PRN
  Administered 2012-08-04 – 2012-08-15 (×12): 10 mL
  Administered 2012-08-20: 20 mL
  Administered 2012-08-20: 10 mL
  Administered 2012-08-20 – 2012-08-21 (×2): 20 mL

## 2012-07-30 MED ORDER — IOHEXOL 300 MG/ML  SOLN: 50.0000 mL | Freq: Once | INTRAMUSCULAR | Status: AC | PRN

## 2012-07-30 MED ORDER — SODIUM CHLORIDE 0.9 % IJ SOLN
10.0000 mL | Freq: Two times a day (BID) | INTRAMUSCULAR | Status: DC
  Administered 2012-07-31 – 2012-08-01 (×3): 10 mL
  Administered 2012-08-02: 20 mL
  Administered 2012-08-03 – 2012-08-12 (×5): 10 mL
  Administered 2012-08-13: 20 mL
  Administered 2012-08-13: 40 mL
  Administered 2012-08-16 – 2012-08-18 (×2): 20 mL
  Administered 2012-08-20: 10 mL

## 2012-07-30 NOTE — Progress Notes (Signed)
The Pt has refused on many occasions to be repositioned in the bed. Will continue to encourage and educate about the importance of ROM and repositioning. The Pt has also been educated on the reason why his O2 stat probe is used he continues to refuse at this time. Will continue to monitor.
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
## 2012-07-30 NOTE — Progress Notes (Signed)
Peripherally Inserted Central Catheter/Midline Placement  The IV Nurse has discussed with the patient and/or persons authorized to consent for the patient, the purpose of this procedure and the potential benefits and risks involved with this procedure.  The benefits include less needle sticks, lab draws from the catheter and patient may be discharged home with the catheter.  Risks include, but not limited to, infection, bleeding, blood clot (thrombus formation), and puncture of an artery; nerve damage and irregular heat beat.  Alternatives to this procedure were also discussed.  PICC/Midline Placement Documentation        Lisabeth Devoid 07/30/2012, 5:50 PM

## 2012-07-30 NOTE — Progress Notes (Signed)
[facility]    Subjective: No new complaints   Antibiotics:  Anti-infectives     Start     Dose/Rate Route Frequency Ordered Stop   07/29/12 0100   meropenem (MERREM) 1 g in sodium chloride 0.9 % 100 mL IVPB        1 g 200 mL/hr over 30 Minutes Intravenous 3 times per day 07/29/12 0050     07/26/12 0500   vancomycin (VANCOCIN) 750 mg in sodium chloride 0.9 % 150 mL IVPB  Status:  Discontinued        750 mg 150 mL/hr over 60 Minutes Intravenous Every 12 hours 07/25/12 1958 07/28/12 1033   07/26/12 0200   piperacillin-tazobactam (ZOSYN) IVPB 3.375 g  Status:  Discontinued        3.375 g 12.5 mL/hr over 240 Minutes Intravenous Every 8 hours 07/25/12 1958 07/29/12 0043   07/25/12 1700   vancomycin (VANCOCIN) IVPB 1000 mg/200 mL premix        1,000 mg 200 mL/hr over 60 Minutes Intravenous  Once 07/25/12 1619 07/25/12 1807   07/25/12 1630   piperacillin-tazobactam (ZOSYN) IVPB 3.375 g  Status:  Discontinued        3.375 g 12.5 mL/hr over 240 Minutes Intravenous  Once 07/25/12 1619 07/25/12 1938          Medications: Scheduled Meds:    . calcium-vitamin D  2 tablet Oral BID  . Chlorhexidine Gluconate Cloth  6 each Topical Q0600  . feeding supplement  237 mL Oral BID BM  . feeding supplement  1 Container Oral BID BM  . ferrous gluconate  324 mg Oral BID  . gabapentin  900 mg Oral TID  . magnesium oxide  400 mg Oral BID  . meropenem (MERREM) IV  1 g Intravenous Q8H  . methadone  5 mg Oral BID  . multivitamin with minerals  1 tablet Oral Daily  . mupirocin ointment  1 application Nasal BID  . nystatin  5 mL Oral QID  . oxybutynin  5 mg Oral TID  . phosphorus  500 mg Oral TID  . potassium chloride  10 mEq Intravenous Q1 Hr x 2  . simvastatin  40 mg Oral QHS   Continuous Infusions:    . 0.9 % NaCl with KCl 40 mEq / L 50 mL/hr at 07/29/12 1008  . DOPamine Stopped (07/27/12 1330)  . DOPamine NICU IV Infusion 1600 mcg/mL <1.5 kg ([place])  3 mcg/kg/min (07/28/12 0900)   PRN Meds:.acetaminophen, acetaminophen, HYDROcodone-acetaminophen, HYDROmorphone (DILAUDID) injection, iohexol, ondansetron (ZOFRAN) IV, ondansetron, phenol, promethazine   Objective: Weight change:   Intake/Output Summary (Last 24 hours) at 07/30/12 1226 Last data filed at 07/29/12 2300  Gross per 24 hour  Intake    890 ml  Output   1800 ml  Net   -910 ml   Blood pressure 96/57, pulse 101, temperature 98.8 F (37.1 C), temperature source Oral, resp. rate 18, height 6' 2.5" (1.892 m), weight 175 lb 7.8 oz (79.6 kg), SpO2 93.00%. Temp:  [97.7 F (36.5 C)-99.2 F (37.3 C)] 98.8 F (37.1 C) (10/20 1100) Pulse Rate:  [98-105] 101  (10/20 1147) Resp:  [10-18] 18  (10/20 1147) BP: (87-100)/(36-57) 96/57 mmHg (10/20 1147) SpO2:  [93 %-100 %] 93 % (10/20 1147)  Physical Exam: General: Alert and awake, oriented x3, not in any acute distress. HEENT: anicteric sclera, pupils reactive to light and accommodation, EOMI CVS regular rate, normal r,  no murmur  rubs or gallops, device pocket site is CDI not warm Chest: clear to auscultation bilaterally, no wheezing, rales or rhonchi Abdomen: soft  HE has no sensation below waist.    Lab Results:  Basename 07/30/12 0757 07/29/12 0110  WBC 7.2 8.6  HGB 8.0* 7.6*  HCT 24.8* 23.5*  PLT 259 247    BMET  Basename 07/30/12 0339 07/29/12 0110  NA 134* 134*  K 3.6 3.3*  CL 106 107  CO2 21 19  GLUCOSE 86 85  BUN 5* 9  CREATININE 0.58 0.66  CALCIUM 7.2* 7.3*    Micro Results: Recent Results (from the past 240 hour(s))  CULTURE, BLOOD (ROUTINE X 2)     Status: Normal   Collection Time   07/25/12  4:10 PM      Component Value Range Status Comment   Specimen Description BLOOD LEFT ARM   Final    Special Requests BOTTLES DRAWN AEROBIC ONLY 3CC   Final    Culture  Setup Time 07/25/2012 22:13   Final    Culture     Final    Value: STAPHYLOCOCCUS AUREUS     Note: RIFAMPIN AND GENTAMICIN SHOULD NOT BE  USED AS SINGLE DRUGS FOR TREATMENT OF STAPH INFECTIONS.     Note: Gram Stain Report Called to,Read Back By and Verified With: STEPHANIE DILLON 07/26/12 0830 BY SMITHERSJ   Report Status 07/28/2012 FINAL   Final    Organism ID, Bacteria STAPHYLOCOCCUS AUREUS   Final   CULTURE, BLOOD (ROUTINE X 2)     Status: Normal (Preliminary result)   Collection Time   07/25/12  6:00 PM      Component Value Range Status Comment   Specimen Description BLOOD LEFT ARM   Final    Special Requests BOTTLES DRAWN AEROBIC ONLY 3CC   Final    Culture  Setup Time 07/25/2012 22:13   Final    Culture     Final    Value:        BLOOD CULTURE RECEIVED NO GROWTH TO DATE CULTURE WILL BE HELD FOR 5 DAYS BEFORE ISSUING A FINAL NEGATIVE REPORT   Report Status PENDING   Incomplete   URINE CULTURE     Status: Normal   Collection Time   07/25/12  6:50 PM      Component Value Range Status Comment   Specimen Description URINE, RANDOM   Final    Special Requests NONE   Final    Culture  Setup Time 07/26/2012 01:47   Final    Colony Count >=100,000 COLONIES/ML   Final    Culture     Final    Value: ESCHERICHIA COLI     Note: Confirmed Extended Spectrum Beta-Lactamase Producer (ESBL) CRITICAL RESULT CALLED TO, READ BACK BY AND VERIFIED WITH: VANCE JOHNSON @ 0025 ON 07/29/2012 HAJAM   Report Status 07/29/2012 FINAL   Final    Organism ID, Bacteria ESCHERICHIA COLI   Final   WOUND CULTURE     Status: Normal   Collection Time   07/25/12  8:10 PM      Component Value Range Status Comment   Specimen Description SACRAL   Final    Special Requests Normal   Final    Gram Stain     Final    Value: MODERATE WBC PRESENT,BOTH PMN AND MONONUCLEAR     NO SQUAMOUS EPITHELIAL CELLS SEEN     RARE GRAM NEGATIVE RODS     RARE GRAM POSITIVE RODS  RARE GRAM POSITIVE COCCI IN PAIRS   Culture     Final    Value: FEW STAPHYLOCOCCUS AUREUS     Note: RIFAMPIN AND GENTAMICIN SHOULD NOT BE USED AS SINGLE DRUGS FOR TREATMENT OF STAPH  INFECTIONS.   Report Status 07/28/2012 FINAL   Final    Organism ID, Bacteria STAPHYLOCOCCUS AUREUS   Final   MRSA PCR SCREENING     Status: Abnormal   Collection Time   07/25/12  8:13 PM      Component Value Range Status Comment   MRSA by PCR POSITIVE (*) NEGATIVE Final   CULTURE, BLOOD (ROUTINE X 2)     Status: Normal (Preliminary result)   Collection Time   07/27/12  3:47 PM      Component Value Range Status Comment   Specimen Description BLOOD LEFT ARM   Final    Special Requests BOTTLES DRAWN AEROBIC ONLY 5CC   Final    Culture  Setup Time 07/27/2012 22:30   Final    Culture     Final    Value:        BLOOD CULTURE RECEIVED NO GROWTH TO DATE CULTURE WILL BE HELD FOR 5 DAYS BEFORE ISSUING A FINAL NEGATIVE REPORT   Report Status PENDING   Incomplete   CULTURE, BLOOD (ROUTINE X 2)     Status: Normal (Preliminary result)   Collection Time   07/27/12  4:00 PM      Component Value Range Status Comment   Specimen Description BLOOD LEFT ARM   Final    Special Requests BOTTLES DRAWN AEROBIC ONLY 3CC   Final    Culture  Setup Time 07/27/2012 22:30   Final    Culture     Final    Value:        BLOOD CULTURE RECEIVED NO GROWTH TO DATE CULTURE WILL BE HELD FOR 5 DAYS BEFORE ISSUING A FINAL NEGATIVE REPORT   Report Status PENDING   Incomplete     Studies/Results: Ct Abdomen Pelvis W Contrast  07/28/2012  *RADIOLOGY REPORT*  Clinical Data: Sacral osteomyelitis.  CT ABDOMEN AND PELVIS WITH CONTRAST  Technique:  Multidetector CT imaging of the abdomen and pelvis was performed following the standard protocol during bolus administration of intravenous contrast.  Contrast: OMNIPAQUE IOHEXOL 300 MG/ML  SOLN  Comparison: CT scan of the lumbar spine dated 07/25/2012  Findings: There are small bilateral pleural effusions and a small pericardial effusion.  The liver, spleen, pancreas, gallbladder, adrenal glands, and left kidney are normal.  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  The patient has a left  inguinal hernia containing sigmoid colon.  The hernia extends into the left side of the scrotum and is surrounded by fluid.  There is a deep left buttock decubitus ulcer that extends to the left ischial tuberosity with osteomyelitis of the left inferior pubic ramus and distal sacrum.  There is a prominent left hip effusion which is probably infected.  There is extensive soft tissue edema in the area of the decubitus ulcer.  There is a small amount of fluid in the pelvis extending along the inferior aspect of the left pericolic gutter, nonspecific.  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  IMPRESSION:  1.  Prominent left hip effusion, probably infected secondary to the adjacent large decubitus ulcer of the left buttock. 2.  Large left inguinal hernia containing the sigmoid colon.  No evidence of obstruction. 3.  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 3. Cellulitis around the decubitus ulcer and probably in the right buttock as well.   Original Report Authenticated By: Gwynn Burly, M.D.    Dg Fluoro Guide Ndl Plc/bx  07/30/2012  *RADIOLOGY REPORT*  Indication:  Left hip joint effusion  ARTHOROCENTESIS/INJECTION OF LARGE JOINT  Comparison: CT abdomen pelvis - 07/28/2012  Contrast: 3 ml Isovue-300  Fluoroscopy time: 27 seconds  Complications: None immediate  Technique / Findings:  Informed written consent was obtained from the patient after discussion of the risks, benefits and alternatives to treatment. The patient was placed prone on the fluoroscopy table and the left extremity was placed in a slight degree of flexion and internal rotation.  The left hip was localized with fluoroscopy.  The skin overlying the anterior aspect of the hip was prepped and draped in usual sterile fashion.  A 20 gauge spinal needle was advanced into the hip joint at the lateral aspect of the femoral head-neck junction, after the overlying soft tissues were anesthetized with 1%  lidocaine.  Contrast injection confirms appropriate positioning within the hip joint. A fluoroscopic image was saved and sent to PACs.  A small amount of saline was injected into the joint space and aspirated.  The syringe was capped and sent to the laboratory for analysis as ordered by the clinical team.  The needle was removed and a dressing was placed.  The patient tolerated procedure well without immediate postprocedural complication.  Impression:  Successful fluoroscopic guided aspiration of the left hip.   Original Report Authenticated By: Waynard Reeds, M.D.       Assessment/Plan: [name] [name] is a 36 y.o. male with  MSSA bacteremia WITH AICD, chronic decubitus ulcer, now newly diagnosed hip effusion and ESBL in urine  # 1 methicillin sensitive staph aureus bacteremia: I am very concerned about his device given his methicillin sensitive staph aureus bacteremia. -- He will need a transesophageal echocardiogram performed at [facility] to exclude vegetations on the device wires, and we still may need to consider removal of the device regardless.His hip has been aspirated by IR today  --I THINK HE IS CURRENTLY STABLE FOR TRANSFER FROM [facility] TO [facility] FOR TEE -- Certainly other sources (besides the ulcer) of infection or of metastatic spread need to be sought -- His hip effusion is concerning and need to followup aspirate from today --Repeat blood cultures are incubating to document clearance. --the meropenem he is currently on will cover his methicillin sensitive staph aureus quite well want to narrow him to cefazolin soon.  #2 large decubitus ulcer with Chronic osteomyelitis of the left ischium and sacrum: deep cultures would be helpful from bone. I do agree with multidisciplinary care to treat his ulcer and I agree with a diverting colostomy to protect the ulcer.  #3 extended spectrum beta-lactamase in the urine:SIgnificance of this organism is unclear here since we have an alternative  explanation of his sepsis with his MSSA bacteremia. However for now I don't think it is unreasonable to continue coverage of this organism.  #4 screening : check  HIV antibody      LOS: 5 days   Acey Lav 07/30/2012, 12:26 PM

## 2012-07-30 NOTE — Procedures (Signed)
Technically successful left hip aspiration yielding a scant amount of joint fluid.  Samples sent to lab as requested by ordering clinical team.

## 2012-07-30 NOTE — Progress Notes (Signed)
Wet to dry dressing done with packing . To sacrum and posterior thighs. The large opening in the Pt sacrum appeared to have pink, black and brown granulation tissues. Opening noted to have and odor with a copious amount of drainage noted. Tol well with intermittent muscle spasms noted. Will continue to monitor.

## 2012-07-31 ENCOUNTER — Encounter (HOSPITAL_COMMUNITY): Payer: Self-pay | Admitting: Physician Assistant

## 2012-07-31 ENCOUNTER — Encounter (HOSPITAL_COMMUNITY)
Admission: EM | Disposition: A | Discharge status: Skilled Nursing Facility | Payer: Self-pay | Source: Home / Self Care | Attending: Internal Medicine

## 2012-07-31 HISTORY — PX: TEE WITHOUT CARDIOVERSION: SHX5443

## 2012-07-31 LAB — COMPREHENSIVE METABOLIC PANEL
ALT: 10 U/L (ref 0–53)
AST: 20 U/L (ref 0–37)
Albumin: 0.9 g/dL — ABNORMAL LOW (ref 3.5–5.2)
CO2: 23 mEq/L (ref 19–32)
Calcium: 7.6 mg/dL — ABNORMAL LOW (ref 8.4–10.5)
Chloride: 108 mEq/L (ref 96–112)
Creatinine, Ser: 0.45 mg/dL — ABNORMAL LOW (ref 0.50–1.35)
GFR calc non Af Amer: >90 mL/min (ref >90)
Sodium: 137 mEq/L (ref 135–145)
Total Bilirubin: 0.5 mg/dL (ref 0.3–1.2)

## 2012-07-31 LAB — CULTURE, BLOOD (ROUTINE X 2): Culture: NO GROWTH

## 2012-07-31 LAB — CBC
HCT: 23.8 % — ABNORMAL LOW (ref 39.0–52.0)
Hemoglobin: 7.5 g/dL — ABNORMAL LOW (ref 13.0–17.0)
MCH: 27 pg (ref 26.0–34.0)
MCHC: 31.5 g/dL (ref 30.0–36.0)
MCV: 85.6 fL (ref 78.0–100.0)
Platelets: 268 10*3/uL (ref 150–400)
RDW: 18.5 % — ABNORMAL HIGH (ref 11.5–15.5)

## 2012-07-31 SURGERY — ECHOCARDIOGRAM, TRANSESOPHAGEAL
Anesthesia: Moderate Sedation

## 2012-07-31 MED ORDER — VITAMIN E 180 MG (400 UNIT) PO CAPS
400.0000 [IU] | ORAL_CAPSULE | Freq: Every day | ORAL | Status: DC
  Administered 2012-08-03 – 2012-08-21 (×18): 400 [IU] via ORAL
  Filled 2012-07-31 (×23): qty 1

## 2012-07-31 MED ORDER — ZINC SULFATE 220 (50 ZN) MG PO CAPS
220.0000 mg | ORAL_CAPSULE | Freq: Every day | ORAL | Status: DC
  Administered 2012-07-31 – 2012-08-20 (×15): 220 mg via ORAL
  Filled 2012-07-31 (×24): qty 1

## 2012-07-31 MED ORDER — MIDAZOLAM HCL 10 MG/2ML IJ SOLN
INTRAMUSCULAR | Status: DC | PRN
  Administered 2012-07-31 (×6): 2 mg via INTRAVENOUS

## 2012-07-31 MED ORDER — DIPHENHYDRAMINE HCL 50 MG/ML IJ SOLN
INTRAMUSCULAR | Status: DC | PRN
  Administered 2012-07-31 (×2): 25 mg via INTRAVENOUS

## 2012-07-31 MED ORDER — VITAMIN C 500 MG PO TABS
500.0000 mg | ORAL_TABLET | Freq: Two times a day (BID) | ORAL | Status: DC
  Administered 2012-07-31 – 2012-08-18 (×26): 500 mg via ORAL
  Filled 2012-07-31 (×45): qty 1

## 2012-07-31 MED ORDER — FENTANYL CITRATE 0.05 MG/ML IJ SOLN
INTRAMUSCULAR | Status: DC | PRN
  Administered 2012-07-31 (×4): 25 ug via INTRAVENOUS

## 2012-07-31 MED ORDER — LIDOCAINE VISCOUS 2 % MT SOLN
OROMUCOSAL | Status: DC | PRN
  Administered 2012-07-31: 10 mL via OROMUCOSAL

## 2012-07-31 MED ORDER — SODIUM CHLORIDE 0.9 % IV SOLN: INTRAVENOUS | Status: DC

## 2012-07-31 NOTE — Progress Notes (Signed)
TEE aborted.  Pt could not be sedated enough to put the scope down.

## 2012-07-31 NOTE — Progress Notes (Signed)
Nutrition Follow-up/Consult for wound healing Full Assessment 07/26/12  Intervention:   Need new weight Encouraged bowel protocol- no BM since admit Continue Ensure Complete bid for now [employer] bid Speak with pt more once diet is advanced.  Assessment:  Pt s/p arthrocentesis/injection of left hip joint, for TEE today and a diverting ostomy when scheduled.  Large stage IV sacral decubitus, relatively clean per notes. Wound looks better today per PT after wound dressing change.  Cellulitis around the decubitus ulcer and probably right  buttock as well. Vitamin C, E, and Zinc added.  Continues on MVI and Oscal with D. No bowel movement in several days.  None documented since admit. Pt refused medication to relieve constipation.  Refusing to be repositioned in bed at times.  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  Pt states "clindamycin" took taste buds away.  No appetite and has been eating poorly. Dislikes Resource and complains that Ensure made him bloated.  Milk also causes bloating.  Discussed need for BM to help with some issues of poor appetite and bloating.  Pt currently feeling very bad with nausea, hot flashes, pain.  Discussed with nurse who provided him meds.  Will see once diet resumed.  Pt very polite but states he is "dealing with a lot" (outside of current hospitalization).  Diet Order:  Currently NPO for TEE          Was Regular with Ensure Complete bid and Resource Breeze bid  Diet   Meds: Scheduled Meds:   . calcium-vitamin D  2 tablet Oral BID  . Chlorhexidine Gluconate Cloth  6 each Topical Q0600  . feeding supplement  237 mL Oral BID BM  . feeding supplement  1 Container Oral BID BM  . ferrous gluconate  324 mg Oral BID  . gabapentin  900 mg Oral TID  . magnesium oxide  400 mg Oral BID  . meropenem (MERREM) IV  1 g Intravenous Q8H  . methadone  5 mg Oral BID  . multivitamin with minerals  1 tablet Oral Daily  . mupirocin ointment  1 application Nasal  BID  . nystatin  5 mL Oral QID  . oxybutynin  5 mg Oral TID  . phosphorus  500 mg Oral TID  . simvastatin  40 mg Oral QHS  . sodium chloride  10-40 mL Intracatheter Q12H  . vitamin C  500 mg Oral BID  . vitamin E  400 Units Oral Daily  . zinc sulfate  220 mg Oral Daily   Continuous Infusions:   . 0.9 % NaCl with KCl 40 mEq / L 50 mL/hr at 07/31/12 0321  . DISCONTD: DOPamine Stopped (07/27/12 1330)  . DISCONTD: DOPamine NICU IV Infusion 1600 mcg/mL <1.5 kg ([place]) 3 mcg/kg/min (07/28/12 0900)   PRN Meds:.acetaminophen, acetaminophen, HYDROcodone-acetaminophen, HYDROmorphone (DILAUDID) injection, iohexol, ondansetron (ZOFRAN) IV, ondansetron, phenol, promethazine, sodium chloride  Labs:  CMP     Component Value Date/Time   NA 137 07/31/2012 0420   K 4.0 07/31/2012 0420   CL 108 07/31/2012 0420   CO2 23 07/31/2012 0420   GLUCOSE 90 07/31/2012 0420   BUN 5* 07/31/2012 0420   CREATININE 0.45* 07/31/2012 0420   CALCIUM 7.6* 07/31/2012 0420   PROT 5.8* 07/31/2012 0420   ALBUMIN 0.9* 07/31/2012 0420   AST 20 07/31/2012 0420   ALT 10 07/31/2012 0420   ALKPHOS 106 07/31/2012 0420   BILITOT 0.5 07/31/2012 0420   GFRNONAA >90 07/31/2012 0420   GFRAA >90 07/31/2012 0420  Prealbumin to be checked today per MD   Intake/Output Summary (Last 24 hours) at 07/31/12 0900 Last data filed at 07/31/12 0252  Gross per 24 hour  Intake    400 ml  Output   1000 ml  Net   -600 ml    Weight Status:  79.6 kg (10/15) no new weight  Re-estimated needs:  2200-2400 kcal, 120-145 gm protein  Nutrition Dx:  Inadequate oral intake-continues  Goal:  Intake on >90% meals and supplements  Monitor:  Intake, labs, weight, wound   Oran Rein, RD, LDN Clinical Inpatient Dietitian Pager:  [phone] Weekend and after hours pager:  [phone]

## 2012-07-31 NOTE — Interval H&P Note (Signed)
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 After consideration of risks, benefits and other options for treatment, the patient has consented to  Procedure(s) (LRB) with comments: TRANSESOPHAGEAL ECHOCARDIOGRAM (TEE) (N/A) - MRSA as a surgical intervention .  The patient's history has been reviewed, patient examined, no change in status, stable for surgery.  I have reviewed the patient's chart and labs.  Questions were answered to the patient's satisfaction.     Dietrich Pates
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
## 2012-07-31 NOTE — Progress Notes (Signed)
07/31/12 1100  Wound 07/27/12 Sacrum Posterior;Lower pressure ulcer with bone exposed  Date First Assessed/Time First Assessed: 07/27/12 1130   Location: Sacrum  Location Orientation: Posterior;Lower  Wound Description (Comments): pressure ulcer with bone exposed  Present on Admission: Yes  Margins Unattacted edges (unapproximated)  Drainage Amount Copious  Drainage Description Purulent;Odor  Non-staged Wound Description Full thickness  Dressing Type ABD;Moist to dry;Tape dressing  Dressing Status Clean;Dry;Intact  Hydrotherapy  Pulsed Lavage with Suction (psi) 8 psi (varied from 4-12 depending on necrotic thickness)  Pulsed Lavage with Suction - Normal Saline Used 1000 mL  Pulsed Lavage Tip Tip with splash shield  Pulsed lavage therapy - wound location sacrum, posterior thighs, perineum  Wound Therapy - Assess/Plan/Recommendations  Wound Therapy - Clinical Statement Pt tolerated session well today.  Had lower body muscle spasms today that prevented too much selective debridement after pulsed lavage.   Wound Therapy Goals - Improve the function of patient's integumentary system by progressing the wound(s) through the phases of wound healing by:  Decrease Necrotic Tissue - Progress Progressing toward goal   Pt c/o L lower quad ABD pain, reported to RN Pt c/o of being NPO, he feels he has no energy  Felecia Shelling  PTA [facility]  Acute  Rehab Pager     [phone]

## 2012-07-31 NOTE — Progress Notes (Signed)
Informed patient that dressing change to sacrum needed to be changed. It is order to be changed twice at day. Per day shift nurse last changed at 1400 on 07/30/12 . Patient refused at this time. Made patient aware that it was necessary that he allows for dressing change to occur BID on Sunday to assist with the healing process. Patient still refused at this time.

## 2012-07-31 NOTE — H&P (View-Only) (Signed)
Subjective: No complaints. Agreeable to ostomy  Objective: Vital signs in last 24 hours: Temp:  [97.7 F (36.5 C)-98.8 F (37.1 C)] 97.8 F (36.6 C) (10/21 0411) Pulse Rate:  [62-101] 62  (10/21 0330) Resp:  [12-18] 18  (10/21 0330) BP: (96-101)/(51-58) 101/58 mmHg (10/21 0330) SpO2:  [90 %-100 %] 100 % (10/21 0330) Last BM Date:  (PTA, Pt can't remember)  Intake/Output from previous day: 10/20 0701 - 10/21 0700 In: 450 [I.V.:350; IV Piggyback:100] Out: 1000 [Urine:1000] Intake/Output this shift:    GI: soft, nontender. well healed midline scar. large reducible left inguinal hernia. Large sacral wound relatively clean  Lab Results:   Basename 07/31/12 0420 07/30/12 0757  WBC 5.5 7.2  HGB 7.5* 8.0*  HCT 23.8* 24.8*  PLT 268 259   BMET  Basename 07/31/12 0420 07/30/12 0339  NA 137 134*  K 4.0 3.6  CL 108 106  CO2 23 21  GLUCOSE 90 86  BUN 5* 5*  CREATININE 0.45* 0.58  CALCIUM 7.6* 7.2*   PT/INR No results found for this basename: LABPROT:2,INR:2 in the last 72 hours ABG No results found for this basename: PHART:2,PCO2:2,PO2:2,HCO3:2 in the last 72 hours  Studies/Results: Dg Fluoro Guide Ndl Plc/bx  07/30/2012  *RADIOLOGY REPORT*  Indication:  Left hip joint effusion  ARTHOROCENTESIS/INJECTION OF LARGE JOINT  Comparison: CT abdomen pelvis - 07/28/2012  Contrast: 3 ml Isovue-300  Fluoroscopy time: 27 seconds  Complications: None immediate  Technique / Findings:  Informed written consent was obtained from the patient after discussion of the risks, benefits and alternatives to treatment. The patient was placed prone on the fluoroscopy table and the left extremity was placed in a slight degree of flexion and internal rotation.  The left hip was localized with fluoroscopy.  The skin overlying the anterior aspect of the hip was prepped and draped in usual sterile fashion.  A 20 gauge spinal needle was advanced into the hip joint at the lateral aspect of the femoral  head-neck junction, after the overlying soft tissues were anesthetized with 1% lidocaine.  Contrast injection confirms appropriate positioning within the hip joint. A fluoroscopic image was saved and sent to PACs.  A small amount of saline was injected into the joint space and aspirated.  The syringe was capped and sent to the laboratory for analysis as ordered by the clinical team.  The needle was removed and a dressing was placed.  The patient tolerated procedure well without immediate postprocedural complication.  Impression:  Successful fluoroscopic guided aspiration of the left hip.   Original Report Authenticated By: JOHN A. WATTS V, M.D.     Anti-infectives: Anti-infectives     Start     Dose/Rate Route Frequency Ordered Stop   07/29/12 0100   meropenem (MERREM) 1 g in sodium chloride 0.9 % 100 mL IVPB        1 g 200 mL/hr over 30 Minutes Intravenous 3 times per day 07/29/12 0050     07/26/12 0500   vancomycin (VANCOCIN) 750 mg in sodium chloride 0.9 % 150 mL IVPB  Status:  Discontinued        750 mg 150 mL/hr over 60 Minutes Intravenous Every 12 hours 07/25/12 1958 07/28/12 1033   07/26/12 0200   piperacillin-tazobactam (ZOSYN) IVPB 3.375 g  Status:  Discontinued        3.375 g 12.5 mL/hr over 240 Minutes Intravenous Every 8 hours 07/25/12 1958 07/29/12 0043   07/25/12 1700   vancomycin (VANCOCIN) IVPB 1000 mg/200 mL   premix        1,000 mg 200 mL/hr over 60 Minutes Intravenous  Once 07/25/12 1619 07/25/12 1807   07/25/12 1630   piperacillin-tazobactam (ZOSYN) IVPB 3.375 g  Status:  Discontinued        3.375 g 12.5 mL/hr over 240 Minutes Intravenous  Once 07/25/12 1619 07/25/12 1938          Assessment/Plan: s/p * No surgery found * Advance diet Check prealbumin today For TEE today We discussed the risks and benefits of diverting ostomy and he understands. Will wait for Plastics eval and TEE before scheduling  LOS: 6 days    TOTH III,Trudy Kory S 07/31/2012  

## 2012-07-31 NOTE — Progress Notes (Signed)
Subjective: No complaints. Agreeable to ostomy  Objective: Vital signs in last 24 hours: Temp:  [97.7 F (36.5 C)-98.8 F (37.1 C)] 97.8 F (36.6 C) (10/21 0411) Pulse Rate:  [62-101] 62  (10/21 0330) Resp:  [12-18] 18  (10/21 0330) BP: (96-101)/(51-58) 101/58 mmHg (10/21 0330) SpO2:  [90 %-100 %] 100 % (10/21 0330) Last BM Date:  (PTA, Pt can't remember)  Intake/Output from previous day: 10/20 0701 - 10/21 0700 In: 450 [I.V.:350; IV Piggyback:100] Out: 1000 [Urine:1000] Intake/Output this shift:    GI: soft, nontender. well healed midline scar. large reducible left inguinal hernia. Large sacral wound relatively clean  Lab Results:   Basename 07/31/12 0420 07/30/12 0757  WBC 5.5 7.2  HGB 7.5* 8.0*  HCT 23.8* 24.8*  PLT 268 259   BMET  Basename 07/31/12 0420 07/30/12 0339  NA 137 134*  K 4.0 3.6  CL 108 106  CO2 23 21  GLUCOSE 90 86  BUN 5* 5*  CREATININE 0.45* 0.58  CALCIUM 7.6* 7.2*   PT/INR No results found for this basename: LABPROT:2,INR:2 in the last 72 hours ABG No results found for this basename: PHART:2,PCO2:2,PO2:2,HCO3:2 in the last 72 hours  Studies/Results: Dg Fluoro Guide Ndl Plc/bx  07/30/2012  *RADIOLOGY REPORT*  Indication:  Left hip joint effusion  ARTHOROCENTESIS/INJECTION OF LARGE JOINT  Comparison: CT abdomen pelvis - 07/28/2012  Contrast: 3 ml Isovue-300  Fluoroscopy time: 27 seconds  Complications: None immediate  Technique / Findings:  Informed written consent was obtained from the patient after discussion of the risks, benefits and alternatives to treatment. The patient was placed prone on the fluoroscopy table and the left extremity was placed in a slight degree of flexion and internal rotation.  The left hip was localized with fluoroscopy.  The skin overlying the anterior aspect of the hip was prepped and draped in usual sterile fashion.  A 20 gauge spinal needle was advanced into the hip joint at the lateral aspect of the femoral  head-neck junction, after the overlying soft tissues were anesthetized with 1% lidocaine.  Contrast injection confirms appropriate positioning within the hip joint. A fluoroscopic image was saved and sent to PACs.  A small amount of saline was injected into the joint space and aspirated.  The syringe was capped and sent to the laboratory for analysis as ordered by the clinical team.  The needle was removed and a dressing was placed.  The patient tolerated procedure well without immediate postprocedural complication.  Impression:  Successful fluoroscopic guided aspiration of the left hip.   Original Report Authenticated By: Waynard Reeds, M.D.     Anti-infectives: Anti-infectives     Start     Dose/Rate Route Frequency Ordered Stop   07/29/12 0100   meropenem (MERREM) 1 g in sodium chloride 0.9 % 100 mL IVPB        1 g 200 mL/hr over 30 Minutes Intravenous 3 times per day 07/29/12 0050     07/26/12 0500   vancomycin (VANCOCIN) 750 mg in sodium chloride 0.9 % 150 mL IVPB  Status:  Discontinued        750 mg 150 mL/hr over 60 Minutes Intravenous Every 12 hours 07/25/12 1958 07/28/12 1033   07/26/12 0200   piperacillin-tazobactam (ZOSYN) IVPB 3.375 g  Status:  Discontinued        3.375 g 12.5 mL/hr over 240 Minutes Intravenous Every 8 hours 07/25/12 1958 07/29/12 0043   07/25/12 1700   vancomycin (VANCOCIN) IVPB 1000 mg/200 mL  premix        1,000 mg 200 mL/hr over 60 Minutes Intravenous  Once 07/25/12 1619 07/25/12 1807   07/25/12 1630   piperacillin-tazobactam (ZOSYN) IVPB 3.375 g  Status:  Discontinued        3.375 g 12.5 mL/hr over 240 Minutes Intravenous  Once 07/25/12 1619 07/25/12 1938          Assessment/Plan: s/p * No surgery found * Advance diet Check prealbumin today For TEE today We discussed the risks and benefits of diverting ostomy and he understands. Will wait for Plastics eval and TEE before scheduling  LOS: 6 days    TOTH III,Arling Cerone S 07/31/2012

## 2012-07-31 NOTE — Interval H&P Note (Signed)
History and Physical Interval Note:  07/31/2012 1:53 PM  [name] [name]  has presented today for surgery, with the diagnosis of veg on device lead and valve  The various methods of treatment have been discussed with the patient and family. After consideration of risks, benefits and other options for treatment, the patient has consented to  Procedure(s) (LRB) with comments: TRANSESOPHAGEAL ECHOCARDIOGRAM (TEE) (N/A) - MRSA as a surgical intervention .  The patient's history has been reviewed, patient examined, no change in status, stable for surgery.  I have reviewed the patient's chart and labs.  Questions were answered to the patient's satisfaction.     Dietrich Pates
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
## 2012-07-31 NOTE — Op Note (Signed)
Procedure aborted.  Unable to pass probe  Patient still awake and combative with 50 mg Benadryl; 100 mg Fentanyl; 12 mg Versed. Would recomm arranging with anesthesia assistance

## 2012-07-31 NOTE — Progress Notes (Signed)
Patient states he has not had a bowel movement in several days. Patient complains of bloating and states that he will not take anything that he thinks will make him have a bowel movement. Patient refuse ensure schedule at 2000. Educated the patient on the importance of regular bowel movements and recommended him to take something to help him move bowel. Patient refused at this time. Will cont to monitor.

## 2012-07-31 NOTE — Progress Notes (Signed)
Pt. Refuses some medicines when offered.  Noted also that pt refuses to be turned in bed despite gentle and frequent education and encouragement.  Pt catheterizes self., refuses to wash hands prior to performing, also will not allow staff to be in room during this time to observe technique.  MD aware.

## 2012-08-01 ENCOUNTER — Encounter (HOSPITAL_COMMUNITY): Payer: Self-pay | Admitting: Anesthesiology

## 2012-08-01 ENCOUNTER — Inpatient Hospital Stay (HOSPITAL_COMMUNITY): Payer: Medicaid Other | Admitting: Anesthesiology

## 2012-08-01 ENCOUNTER — Encounter (HOSPITAL_COMMUNITY)
Admission: EM | Disposition: A | Discharge status: Skilled Nursing Facility | Payer: Self-pay | Source: Home / Self Care | Attending: Internal Medicine

## 2012-08-01 ENCOUNTER — Encounter (HOSPITAL_COMMUNITY): Payer: Self-pay | Admitting: Internal Medicine

## 2012-08-01 DIAGNOSIS — K4090 Unilateral inguinal hernia, without obstruction or gangrene, not specified as recurrent: Secondary | ICD-10-CM

## 2012-08-01 HISTORY — PX: COLOSTOMY: SHX63

## 2012-08-01 HISTORY — PX: WOUND DEBRIDEMENT: SHX247

## 2012-08-01 HISTORY — PX: INGUINAL HERNIA REPAIR: SHX194

## 2012-08-01 LAB — GLUCOSE, CAPILLARY
Glucose-Capillary: 76 mg/dL (ref 70–99)
Glucose-Capillary: 125 mg/dL — ABNORMAL HIGH (ref 70–99)
Glucose-Capillary: 143 mg/dL — ABNORMAL HIGH (ref 70–99)

## 2012-08-01 LAB — COMPREHENSIVE METABOLIC PANEL
ALT: 10 U/L (ref 0–53)
Albumin: 1 g/dL — ABNORMAL LOW (ref 3.5–5.2)
Alkaline Phosphatase: 100 U/L (ref 39–117)
BUN: 5 mg/dL — ABNORMAL LOW (ref 6–23)
Potassium: 4.5 mEq/L (ref 3.5–5.1)
Sodium: 135 mEq/L (ref 135–145)
Total Protein: 5.8 g/dL — ABNORMAL LOW (ref 6.0–8.3)

## 2012-08-01 LAB — PREALBUMIN: Prealbumin: 5.3 mg/dL — ABNORMAL LOW (ref 17.0–34.0)

## 2012-08-01 LAB — PHOSPHORUS: Phosphorus: 3 mg/dL (ref 2.3–4.6)

## 2012-08-01 LAB — CBC
MCHC: 31.7 g/dL (ref 30.0–36.0)
RDW: 18.6 % — ABNORMAL HIGH (ref 11.5–15.5)

## 2012-08-01 SURGERY — CREATION, COLOSTOMY
Anesthesia: General | Site: Coccyx | Wound class: Clean Contaminated

## 2012-08-01 MED ORDER — HYDROMORPHONE 0.3 MG/ML IV SOLN
INTRAVENOUS | Status: DC
  Administered 2012-08-01: 15:00:00 via INTRAVENOUS
  Administered 2012-08-01: 5.1 mg via INTRAVENOUS
  Administered 2012-08-01: 0.3 mg via INTRAVENOUS
  Administered 2012-08-01: 3.48 mg via INTRAVENOUS
  Administered 2012-08-02: 10:00:00 via INTRAVENOUS
  Administered 2012-08-02: 4.33 mg via INTRAVENOUS
  Administered 2012-08-02: 19:00:00 via INTRAVENOUS
  Administered 2012-08-02: 0.9 mg via INTRAVENOUS
  Administered 2012-08-02: 3.6 mg via INTRAVENOUS
  Administered 2012-08-02: 1.5 mg via INTRAVENOUS
  Administered 2012-08-03: 5.7 mg via INTRAVENOUS
  Administered 2012-08-03 (×2): via INTRAVENOUS
  Administered 2012-08-03: 2.1 mg via INTRAVENOUS
  Administered 2012-08-03: 6.8 mg via INTRAVENOUS
  Administered 2012-08-03: 4.2 mg via INTRAVENOUS
  Administered 2012-08-04: 01:00:00 via INTRAVENOUS
  Filled 2012-08-01 (×8): qty 25

## 2012-08-01 MED ORDER — SODIUM CHLORIDE 0.9 % IV SOLN
1.0000 g | INTRAVENOUS | Status: DC
  Filled 2012-08-01: qty 1

## 2012-08-01 MED ORDER — PROPOFOL 10 MG/ML IV BOLUS
INTRAVENOUS | Status: DC | PRN
  Administered 2012-08-01: 100 mg via INTRAVENOUS

## 2012-08-01 MED ORDER — FENTANYL CITRATE 0.05 MG/ML IJ SOLN
INTRAMUSCULAR | Status: DC | PRN
  Administered 2012-08-01: 50 ug via INTRAVENOUS
  Administered 2012-08-01: 100 ug via INTRAVENOUS
  Administered 2012-08-01 (×2): 50 ug via INTRAVENOUS

## 2012-08-01 MED ORDER — PROMETHAZINE HCL 25 MG/ML IJ SOLN: 6.2500 mg | INTRAMUSCULAR | Status: DC | PRN

## 2012-08-01 MED ORDER — CEFOXITIN SODIUM-DEXTROSE 1-4 GM-% IV SOLR (PREMIX)
INTRAVENOUS | Status: AC
  Filled 2012-08-01: qty 100

## 2012-08-01 MED ORDER — FENTANYL CITRATE 0.05 MG/ML IJ SOLN
25.0000 ug | INTRAMUSCULAR | Status: DC | PRN
  Administered 2012-08-01 (×3): 50 ug via INTRAVENOUS

## 2012-08-01 MED ORDER — POVIDONE-IODINE 10 % EX OINT
TOPICAL_OINTMENT | CUTANEOUS | Status: AC
  Filled 2012-08-01: qty 28.35

## 2012-08-01 MED ORDER — ONDANSETRON HCL 4 MG/2ML IJ SOLN
4.0000 mg | Freq: Four times a day (QID) | INTRAMUSCULAR | Status: DC | PRN
  Administered 2012-08-09 – 2012-08-10 (×3): 4 mg via INTRAVENOUS
  Filled 2012-08-01 (×3): qty 2

## 2012-08-01 MED ORDER — MIDAZOLAM HCL 5 MG/5ML IJ SOLN
INTRAMUSCULAR | Status: DC | PRN
  Administered 2012-08-01: 2 mg via INTRAVENOUS

## 2012-08-01 MED ORDER — 0.9 % SODIUM CHLORIDE (POUR BTL) OPTIME
TOPICAL | Status: DC | PRN
  Administered 2012-08-01: 2000 mL

## 2012-08-01 MED ORDER — DEXTROSE 50 % IV SOLN
50.0000 mL | Freq: Once | INTRAVENOUS | Status: AC
  Administered 2012-08-01: 50 mL via INTRAVENOUS

## 2012-08-01 MED ORDER — DIPHENHYDRAMINE HCL 12.5 MG/5ML PO ELIX
12.5000 mg | ORAL_SOLUTION | Freq: Four times a day (QID) | ORAL | Status: DC | PRN
  Filled 2012-08-01: qty 5

## 2012-08-01 MED ORDER — FENTANYL CITRATE 0.05 MG/ML IJ SOLN
INTRAMUSCULAR | Status: AC
  Filled 2012-08-01: qty 2

## 2012-08-01 MED ORDER — MORPHINE SULFATE 4 MG/ML IJ SOLN
4.0000 mg | INTRAMUSCULAR | Status: DC | PRN
  Administered 2012-08-01 – 2012-08-03 (×2): 4 mg via INTRAVENOUS
  Filled 2012-08-01 (×2): qty 1

## 2012-08-01 MED ORDER — DEXTROSE 50 % IV SOLN
INTRAVENOUS | Status: AC
  Filled 2012-08-01: qty 50

## 2012-08-01 MED ORDER — CISATRACURIUM BESYLATE (PF) 10 MG/5ML IV SOLN
INTRAVENOUS | Status: DC | PRN
  Administered 2012-08-01: 2 mg via INTRAVENOUS

## 2012-08-01 MED ORDER — LACTATED RINGERS IV SOLN
INTRAVENOUS | Status: DC | PRN
  Administered 2012-08-01 (×2): via INTRAVENOUS

## 2012-08-01 MED ORDER — DIPHENHYDRAMINE HCL 50 MG/ML IJ SOLN
12.5000 mg | Freq: Four times a day (QID) | INTRAMUSCULAR | Status: DC | PRN
  Administered 2012-08-09: 12.5 mg via INTRAVENOUS
  Filled 2012-08-01: qty 1

## 2012-08-01 MED ORDER — NALOXONE HCL 0.4 MG/ML IJ SOLN: 0.4000 mg | INTRAMUSCULAR | Status: DC | PRN

## 2012-08-01 MED ORDER — LACTATED RINGERS IV SOLN: INTRAVENOUS | Status: DC

## 2012-08-01 MED ORDER — SODIUM CHLORIDE 0.9 % IJ SOLN: 9.0000 mL | INTRAMUSCULAR | Status: DC | PRN

## 2012-08-01 MED ORDER — ETOMIDATE 2 MG/ML IV SOLN
INTRAVENOUS | Status: DC | PRN
  Administered 2012-08-01: 16 mg via INTRAVENOUS

## 2012-08-01 MED ORDER — CISATRACURIUM BESYLATE (PF) 10 MG/5ML IV SOLN: INTRAVENOUS | Status: DC | PRN

## 2012-08-01 MED ORDER — ONDANSETRON HCL 4 MG/2ML IJ SOLN
INTRAMUSCULAR | Status: DC | PRN
  Administered 2012-08-01: 4 mg via INTRAVENOUS

## 2012-08-01 MED ORDER — DEXTROSE 50 % IV SOLN
INTRAVENOUS | Status: DC | PRN
  Administered 2012-08-01: 25 g via INTRAVENOUS

## 2012-08-01 MED ORDER — DEXAMETHASONE SODIUM PHOSPHATE 10 MG/ML IJ SOLN
INTRAMUSCULAR | Status: DC | PRN
  Administered 2012-08-01: 10 mg via INTRAVENOUS

## 2012-08-01 SURGICAL SUPPLY — 83 items
ADH SKN CLS APL DERMABOND .7 (GAUZE/BANDAGES/DRESSINGS)
APL SKNCLS STERI-STRIP NONHPOA (GAUZE/BANDAGES/DRESSINGS)
APPLICATOR COTTON TIP 6IN STRL (MISCELLANEOUS) ×6 IMPLANT
BANDAGE GAUZE ELAST BULKY 4 IN (GAUZE/BANDAGES/DRESSINGS) ×1 IMPLANT
BENZOIN TINCTURE PRP APPL 2/3 (GAUZE/BANDAGES/DRESSINGS) ×3 IMPLANT
BLADE EXTENDED COATED 6.5IN (ELECTRODE) ×1 IMPLANT
BLADE HEX COATED 2.75 (ELECTRODE) ×4 IMPLANT
BLADE SURG 15 STRL LF DISP TIS (BLADE) ×3 IMPLANT
BLADE SURG 15 STRL SS (BLADE)
BLADE SURG SZ10 CARB STEEL (BLADE) ×4 IMPLANT
CANISTER SUCTION 2500CC (MISCELLANEOUS) ×4 IMPLANT
CLAMP POUCH DRAINAGE QUIET (OSTOMY) ×1 IMPLANT
CLIP TI LARGE 6 (CLIP) IMPLANT
CLOTH BEACON ORANGE TIMEOUT ST (SAFETY) ×4 IMPLANT
COVER MAYO STAND STRL (DRAPES) ×4 IMPLANT
DECANTER SPIKE VIAL GLASS SM (MISCELLANEOUS) ×3 IMPLANT
DERMABOND ADVANCED (GAUZE/BANDAGES/DRESSINGS)
DERMABOND ADVANCED .7 DNX12 (GAUZE/BANDAGES/DRESSINGS) IMPLANT
DRAIN PENROSE 18X1/2 LTX STRL (DRAIN) ×3 IMPLANT
DRAPE LAPAROSCOPIC ABDOMINAL (DRAPES) ×4 IMPLANT
DRAPE LG THREE QUARTER DISP (DRAPES) IMPLANT
DRAPE WARM FLUID 44X44 (DRAPE) ×4 IMPLANT
DRSG PAD ABDOMINAL 8X10 ST (GAUZE/BANDAGES/DRESSINGS) ×1 IMPLANT
ELECT REM PT RETURN 9FT ADLT (ELECTROSURGICAL) ×4
ELECTRODE REM PT RTRN 9FT ADLT (ELECTROSURGICAL) ×3 IMPLANT
GLOVE BIO SURGEON STRL SZ7.5 (GLOVE) ×8 IMPLANT
GLOVE BIOGEL PI IND STRL 7.0 (GLOVE) ×3 IMPLANT
GLOVE BIOGEL PI INDICATOR 7.0 (GLOVE) ×1
GOWN PREVENTION PLUS XLARGE (GOWN DISPOSABLE) ×3 IMPLANT
GOWN STRL NON-REIN LRG LVL3 (GOWN DISPOSABLE) ×4 IMPLANT
GOWN STRL REIN XL XLG (GOWN DISPOSABLE) ×10 IMPLANT
HAND ACTIVATED (MISCELLANEOUS) IMPLANT
KIT BASIN OR (CUSTOM PROCEDURE TRAY) ×4 IMPLANT
LEGGING LITHOTOMY PAIR STRL (DRAPES) ×3 IMPLANT
LIGASURE IMPACT 36 18CM CVD LR (INSTRUMENTS) ×1 IMPLANT
NDL HYPO 25X1 1.5 SAFETY (NEEDLE) ×3 IMPLANT
NEEDLE HYPO 25X1 1.5 SAFETY (NEEDLE) IMPLANT
NS IRRIG 1000ML POUR BTL (IV SOLUTION) ×8 IMPLANT
PACK BASIC VI WITH GOWN DISP (CUSTOM PROCEDURE TRAY) ×3 IMPLANT
PACK GENERAL/GYN (CUSTOM PROCEDURE TRAY) ×4 IMPLANT
PENCIL BUTTON HOLSTER BLD 10FT (ELECTRODE) ×4 IMPLANT
POUCH OSTOMY 2 3/4  H 3804 (WOUND CARE) ×1
POUCH OSTOMY 2 3/4 H 3804 (WOUND CARE) ×3
POUCH OSTOMY 2 PC DRNBL 2.75 (WOUND CARE) IMPLANT
SPONGE GAUZE 4X4 12PLY (GAUZE/BANDAGES/DRESSINGS) ×3 IMPLANT
SPONGE LAP 18X18 X RAY DECT (DISPOSABLE) ×3 IMPLANT
STAPLER PROXIMATE 75MM BLUE (STAPLE) ×1 IMPLANT
STAPLER VISISTAT 35W (STAPLE) ×4 IMPLANT
STRIP CLOSURE SKIN 1/2X4 (GAUZE/BANDAGES/DRESSINGS) ×3 IMPLANT
SUCTION POOLE TIP (SUCTIONS) ×4 IMPLANT
SUT MNCRL AB 4-0 PS2 18 (SUTURE) ×3 IMPLANT
SUT NOV 1 T60/GS (SUTURE) IMPLANT
SUT NOVA NAB DX-16 0-1 5-0 T12 (SUTURE) ×1 IMPLANT
SUT NOVA T20/GS 25 (SUTURE) ×6 IMPLANT
SUT PDS AB 1 CTX 36 (SUTURE) ×6 IMPLANT
SUT PDS AB 1 TP1 54 (SUTURE) IMPLANT
SUT PDS AB 1 TP1 96 (SUTURE) ×2 IMPLANT
SUT PROLENE 2 0 KS (SUTURE) IMPLANT
SUT PROLENE 2 0 SH DA (SUTURE) ×12 IMPLANT
SUT SILK 2 0 (SUTURE) ×4
SUT SILK 2 0 SH (SUTURE) IMPLANT
SUT SILK 2 0 SH CR/8 (SUTURE) ×4 IMPLANT
SUT SILK 2 0SH CR/8 30 (SUTURE) IMPLANT
SUT SILK 2-0 18XBRD TIE 12 (SUTURE) ×3 IMPLANT
SUT SILK 2-0 30XBRD TIE 12 (SUTURE) IMPLANT
SUT SILK 3 0 (SUTURE) ×4
SUT SILK 3 0 SH CR/8 (SUTURE) ×4 IMPLANT
SUT SILK 3-0 18XBRD TIE 12 (SUTURE) ×3 IMPLANT
SUT VIC AB 0 CT1 18XCR BRD 8 (SUTURE) IMPLANT
SUT VIC AB 0 CT1 8-18 (SUTURE) ×4
SUT VIC AB 2-0 SH 18 (SUTURE) ×2 IMPLANT
SUT VIC AB 2-0 SH 27 (SUTURE)
SUT VIC AB 2-0 SH 27X BRD (SUTURE) ×6 IMPLANT
SUT VIC AB 3-0 SH 27 (SUTURE)
SUT VIC AB 3-0 SH 27XBRD (SUTURE) ×6 IMPLANT
SYR BULB IRRIGATION 50ML (SYRINGE) ×3 IMPLANT
SYR CONTROL 10ML LL (SYRINGE) ×3 IMPLANT
TAPE CLOTH SURG 6X10 WHT LF (GAUZE/BANDAGES/DRESSINGS) ×2 IMPLANT
TOWEL OR 17X26 10 PK STRL BLUE (TOWEL DISPOSABLE) ×8 IMPLANT
TRAY FOLEY CATH 14FRSI W/METER (CATHETERS) ×4 IMPLANT
WATER STERILE IRR 1500ML POUR (IV SOLUTION) IMPLANT
YANKAUER SUCT BULB TIP 10FT TU (MISCELLANEOUS) ×3 IMPLANT
YANKAUER SUCT BULB TIP NO VENT (SUCTIONS) ×4 IMPLANT

## 2012-08-01 NOTE — Anesthesia Preprocedure Evaluation (Addendum)
Anesthesia Evaluation  Patient identified by MRN, date of birth, ID band Patient awake  General Assessment Comment:Septic shock 07/25/12  Reviewed: Allergy & Precautions, H&P , NPO status , Patient's Chart, lab work & pertinent test results  Airway Mallampati: II TM Distance: >3 FB Neck ROM: Limited    Dental No notable dental hx. (+) Dental Advisory Given   Pulmonary neg pulmonary ROS,  breath sounds clear to auscultation  Pulmonary exam normal       Cardiovascular + CAD, + Cardiac Stents and +CHF + Cardiac Defibrillator Rhythm:Regular Rate:Normal + Systolic murmurs EF 30%   Neuro/Psych T10 paraplegic negative psych ROS   GI/Hepatic negative GI ROS, Neg liver ROS,   Endo/Other  negative endocrine ROS  Renal/GU negative Renal ROS  negative genitourinary   Musculoskeletal negative musculoskeletal ROS (+)   Abdominal   Peds negative pediatric ROS (+)  Hematology  (+) Blood dyscrasia, anemia ,   Anesthesia Other Findings   Reproductive/Obstetrics negative OB ROS                         Anesthesia Physical Anesthesia Plan  ASA: IV  Anesthesia Plan: General   Post-op Pain Management:    Induction: Intravenous  Airway Management Planned: Oral ETT  Additional Equipment:   Intra-op Plan:   Post-operative Plan: Extubation in OR  Informed Consent: I have reviewed the patients History and Physical, chart, labs and discussed the procedure including the risks, benefits and alternatives for the proposed anesthesia with the patient or authorized representative who has indicated his/her understanding and acceptance.   Dental advisory given  Plan Discussed with: CRNA and Surgeon  Anesthesia Plan Comments:         Anesthesia Quick Evaluation

## 2012-08-01 NOTE — Preoperative (Signed)
Beta Blockers   Reason not to administer Beta Blockers:Not Applicable 

## 2012-08-01 NOTE — Anesthesia Postprocedure Evaluation (Signed)
  Anesthesia Post-op Note  Patient: [name] [name]  Procedure(s) Performed: Procedure(s) (LRB): COLOSTOMY (N/A) HERNIA REPAIR INGUINAL ADULT (Left) DEBRIDEMENT WOUND (N/A)  Patient Location: PACU  Anesthesia Type: General  Level of Consciousness: awake and alert   Airway and Oxygen Therapy: Patient Spontanous Breathing  Post-op Pain: mild  Post-op Assessment: Post-op Vital signs reviewed, Patient's Cardiovascular Status Stable, Respiratory Function Stable, Patent Airway and No signs of Nausea or vomiting  Post-op Vital Signs: stable  Complications: No apparent anesthesia complications

## 2012-08-01 NOTE — Progress Notes (Signed)
ANTIBIOTIC CONSULT NOTE - FOLLOW UP  Pharmacy Consult for meropenem Indication: UTI, bacteremia, wound infection  No Known Allergies  Patient Measurements: Height: 6' 2.5" (189.2 cm) Weight: 208 lb 8.9 oz (94.6 kg) IBW/kg (Calculated) : 83.35   Vital Signs: Temp: 97.5 F (36.4 C) (10/22 1300) Temp src: Oral (10/22 1300) BP: 105/68 mmHg (10/22 1400) Pulse Rate: 95  (10/22 1400) Intake/Output from previous day: 10/21 0701 - 10/22 0700 In: 1505 [I.V.:1305; IV Piggyback:200] Out: 2025 [Urine:2025] Intake/Output from this shift: Total I/O In: 1150 [I.V.:1050; IV Piggyback:100] Out: 2050 [Urine:2000; Blood:50]  Labs:  Basename 08/01/12 0513 07/31/12 0420 07/30/12 0757 07/30/12 0339  WBC 5.4 5.5 7.2 --  HGB 7.2* 7.5* 8.0* --  PLT 225 268 259 --  LABCREA -- -- -- --  CREATININE 0.45* 0.45* -- 0.58   Estimated Creatinine Clearance: 147.7 ml/min (by C-G formula based on Cr of 0.45).  Microbiology: 10/15 MRSA PCR: positive 10/15 Blood: 1/2 MSSA 10/15 Sacral wound: MSSA 10/15 Urine: E.coli +ESBL - sens to imipenem, nitro, Zosyn. 10/17 blood: ngtd 10/20 joint aspirate: ngtd  Assessment:  74 YOM with paraplegia with chronic osteomyelitis of left ischium and sacrum.  Today, patient went for wound debridement and diverting colostomy to protect large decubitus ulcer.  Decubitis wound grew MSSA.  Patient also found to have MSSA bacteremia and ESBL E.coli UTI.  Day #4 Merrem 1g q8h to cover for both ESBL UTI and MSSA wound and bacteremia.  ID has been consulted and is following.  Because patient has an ICD, ID recommending TEE to r/o vegetations (note 10/21 shows TEE was aborted d/t combative pt; f/u repeat with anesthesia).  Renal function stable, no adjustments to meropenem necessary.   Goal of Therapy:  eradication of infection; dose adjusted per renal clearance  Plan:  Continue meropenem 1g IV q8h. F/u ID c/s recommendations.  Clance Boll 08/01/2012,2:55 PM

## 2012-08-01 NOTE — Progress Notes (Signed)
[facility]    Subjective: Feeling alittle " out of it" and some abdominal pain from surgery. Having pca pain control has helped.  24hr:  Unable to undergo proper sedation yesterday for TEE, thus it was aborted; post-op recovery for open diverting colostomy, hernia repair and sacral wound debridement this afternoon   Antibiotics: Day #8 total antibiotics Day #5 meropenem  Medications:    . calcium-vitamin D  2 tablet Oral BID  . dextrose  50 mL Intravenous Once  . feeding supplement  237 mL Oral BID BM  . feeding supplement  1 Container Oral BID BM  . fentaNYL      . fentaNYL      . ferrous gluconate  324 mg Oral BID  . gabapentin  900 mg Oral TID  . magnesium oxide  400 mg Oral BID  . meropenem (MERREM) IV  1 g Intravenous Q8H  . methadone  5 mg Oral BID  . multivitamin with minerals  1 tablet Oral Daily  . nystatin  5 mL Oral QID  . oxybutynin  5 mg Oral TID  . phosphorus  500 mg Oral TID  . simvastatin  40 mg Oral QHS  . sodium chloride  10-40 mL Intracatheter Q12H  . vitamin C  500 mg Oral BID  . vitamin E  400 Units Oral Daily  . zinc sulfate  220 mg Oral Daily  . DISCONTD: meropenem (MERREM) IV  1 g Intravenous To OR    Objective: Weight change:   Intake/Output Summary (Last 24 hours) at 08/01/12 1359 Last data filed at 08/01/12 1220  Gross per 24 hour  Intake   2160 ml  Output   2675 ml  Net   -515 ml   Blood pressure 116/72, pulse 93, temperature 97.5 F (36.4 C), temperature source Oral, resp. rate 19, height 6' 2.5" (1.892 m), weight 208 lb 8.9 oz (94.6 kg), SpO2 100.00%. Temp:  [97.5 F (36.4 C)-99 F (37.2 C)] 97.5 F (36.4 C) (10/22 1300) Pulse Rate:  [84-96] 93  (10/22 1300) Resp:  [0-24] 19  (10/22 1300) BP: (93-125)/(48-86) 116/72 mmHg (10/22 1300) SpO2:  [91 %-100 %] 100 % (10/22 1300) Weight:  [208 lb 8.9 oz (94.6 kg)] 208 lb 8.9 oz (94.6 kg) (10/22 0523)  Physical Exam: General: Alert and awake,  oriented x3, not in any acute distress. HEENT: anicteric sclera, pupils reactive to light and accommodation, EOMI CVS regular rate, normal r,  no murmur rubs or gallops, device pocket site is CDI not warm Chest: clear to auscultation bilaterally, no wheezing, rales or rhonchi Abdomen: soft, ostomy in LLQ, midline incision covered, serous drainage noted on bandage Ext = HE has no sensation below waist.    Lab Results:  Basename 08/01/12 0513 07/31/12 0420  WBC 5.4 5.5  HGB 7.2* 7.5*  HCT 22.7* 23.8*  PLT 225 268    BMET  Basename 08/01/12 0513 07/31/12 0420  NA 135 137  K 4.5 4.0  CL 106 108  CO2 23 23  GLUCOSE 79 90  BUN 5* 5*  CREATININE 0.45* 0.45*  CALCIUM 7.6* 7.6*    Micro Results: 10/15 blood cx 1/2 MSSA(S to oxa, clinda, erythro, rifampin, bactrim, gent, levo, vanco 1) 10/15 wound cx few MSSA but g/s shows gnr, gpc, gpr 10/15 urine cx: ESBL Ecoli 10/17 blood cx: NGTD 10/20 left hip cx (labelled as shoulder, accidentally): NGTD Studies/Results: No results found.    Assessment/Plan: Nashton Belson is a  36 y.o. male with  MSSA bacteremia WITH AICD, chronic decubitus ulcer, now newly diagnosed hip effusion and ESBL in urine  1)  methicillin sensitive staph aureus bacteremia: despite only 1 blood culture positive for MSSA, I am concerned about his device being seeded 2/2 bacteremia. Still recommend that he undergoes TEE despite not tolerating conscious sedation, and will need anesthesia for proper sedation for TEE. He is currently on meropenem for ESBL UTI which will  cover his methicillin sensitive staph aureus but will need to narrow to cefazolin  Spent 25 minutes discussing needs for TEE with the patient. He would like a day to think about it some more, he is concerned about having more anesthesia.  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 Diverting colostomy  should help healing process. Continue with broad spectrum of meropenem for now and can switch to cefazolin 2gm IV Q 8hr plus oral metronidazole 500mg  TID. Would recommend to ask plastics recommendation for management of wound, or is it possible to get wound vac?  3)  extended spectrum beta-lactamase Ecoli in the urine: 100,000 CFU found in urine culture, but we also have explanation of his sepsis with his MSSA bacteremia. Will finish out course of therapy for uti, then switch to cefazolin 2gm IV q 8hr.     LOS: 7 days   Judyann Munson 08/01/2012, 1:59 PM
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
## 2012-08-01 NOTE — Progress Notes (Signed)
Patient called me into room, states he "needs someone to talk to".  Patient seems very sad and unsettled, tearful at times.  States he is overwhelmed about "all that is going on".  Concerned about the state of his health, and undergoing any additional procedures/surgeries.  Offered emotional support, active listening.  Offered to have chaplain meet with him.  He declines at this time.  Asks for me to call his mom and "check on her" and "tell her how I am".  I called and spoke with patient's mother, Patton Rabinovich.  She seems fully aware of the seriousness of her son's health, and states she will call and check in on him this evening.  Will continue to monitor.

## 2012-08-01 NOTE — Clinical Social Work Note (Signed)
CSW discussed case on rounds with team. CSW to assess in the am, as Pt is in surgery. Per RN, Pt has refused care at times and may need support/assistance in understanding as well as d/c needs.   CSW to follow.   Doreen Salvage, LCSW ICU/Stepdown Clinical Social Worker [facility] Cell [phone] Hours 8am-1200pm M-F

## 2012-08-01 NOTE — Op Note (Signed)
07/25/2012 - 08/01/2012  12:16 PM  PATIENT:  [name] [name]  36 y.o. male  PRE-OPERATIVE DIAGNOSIS:  Sacral Decubitus  POST-OPERATIVE DIAGNOSIS:  Sacral Decubitus  PROCEDURE:  Procedure(s) (LRB) with comments: COLOSTOMY (N/A) - Open Diverting Colostomy HERNIA REPAIR INGUINAL ADULT (Left) DEBRIDEMENT WOUND (N/A)  SURGEON:  Surgeon(s) and Role:    * Robyne Askew, MD - Primary    * Almond Lint, MD - Assisting  PHYSICIAN ASSISTANT:   ASSISTANTS: Dr. Donell Beers  ANESTHESIA:   general  EBL:  Total I/O In: 50 [I.V.:50] Out: 2050 [Urine:2000; Blood:50]  BLOOD ADMINISTERED:none  DRAINS: none   LOCAL MEDICATIONS USED:  NONE  SPECIMEN:  No Specimen  DISPOSITION OF SPECIMEN:  N/A  COUNTS:  YES  TOURNIQUET:  * No tourniquets in log *  DICTATION: .Dragon Dictation After formal consent was obtained the patient was brought to the operating room and placed in the supine position on the operating room table. After adequate induction of general anesthesia the patient was rolled onto his right side so that we could see his decubitus ulcer. A small amount of necrotic tissue superiorly was excised sharply with Metzenbaum scissors. Hemostasis was achieved with the electrocautery. The wound was then packed with Kerlix gauze. The patient was then placed back in the supine position. The abdomen was then prepped with ChloraPrep, lap and dry, and draped in usual sterile manner. A lower midline incision was made with a 10 blade knife. This incision was carried through the skin and subcutaneous tissue sharply with the electrocautery until the linea alba was identified. The linea alba was also excised sharply with the electrocautery. Each side was grasped with her clamps and elevated. The pre-peroneal space was then probed bluntly with finger dissection until we gained access to the abdominal cavity. The rest of the incision was opened under direct vision with the electrocautery. There was some  omentum adherent to the anterior abdominal wall which was taken down sharply with the electrocautery. The abdomen was relatively free. We were able to identify the sigmoid and left colon. Some of this colon was down in a left inguinal hernia. It was easily able to be reduced. The hernia sac was then reduced as well with Allis clamps. The hernia sac was excised sharply with electrocautery. The peritoneum was then reapproximated with multiple figure-of-eight 0 Vicryl stitches. Attention at this point was turned to the colon. A site was chosen the left lower quadrant for bringing the ostomy out. The sigmoid colon was very redundant and easily would reach out to the abdominal wall. A site was chosen for division of the sigmoid colon. The mesentery at this point was opened sharply with the electrocautery. A GIA-75 stapler was placed across the colon at this point, clamped, and fired thereby dividing the colon between staple lines. The mesentery to the colon was then opened sharply with the LigaSure. Care was taken to preserve the blood supply to the divided end of the colon. The skin site for the ostomy was then excised sharply with the 15 blade knife and electrocautery. A core of fatty tissue in the subcutaneous tissue was also excised sharply with the electrocautery. A cruciate incision was made in the fascia of the anterior abdominal wall. The opening was made large enough for 3 fingers to fit through. A Babcock was then placed through this opening and used to grasp the staple line of the proximal end of the colon. The colon was brought through the abdominal wall without difficulty. The colon  was anchored to the fascia with multiple interrupted 2-0 Vicryl stitches. At this point the fascia of the anterior abdominal wall was closed with 2 running #1 double-stranded looped PDS sutures. The subcutaneous tissue was irrigated with copious amounts of saline. The skin was then closed with staples. On the wounds and covered  with a sterile blue towel. The staple line of the ostomy was then excised sharply with the electrocautery. The ostomy was then matured with interrupted 2-0 Vicryl stitches in a Brooke fashion. The ostomy appeared very healthy. An ostomy appliance was applied. Sterile dressings were also applied to the midline incision. The patient tolerated procedure well. At the end of the case needle sponge and instrument counts are correct. The patient was then awakened and taken to recovery in stable condition.  PLAN OF CARE: Admit to inpatient   PATIENT DISPOSITION:  PACU - hemodynamically stable.   Delay start of Pharmacological VTE agent (>24hrs) due to surgical blood loss or risk of bleeding: no

## 2012-08-01 NOTE — Transfer of Care (Signed)
Immediate Anesthesia Transfer of Care Note  Patient: [name] [name]  Procedure(s) Performed: Procedure(s) (LRB) with comments: COLOSTOMY (N/A) - Open Diverting Colostomy HERNIA REPAIR INGUINAL ADULT (Left) DEBRIDEMENT WOUND (N/A)  Patient Location: PACU  Anesthesia Type: General  Level of Consciousness: awake, alert  and patient cooperative  Airway & Oxygen Therapy: Patient Spontanous Breathing and Patient connected to face mask oxygen  Post-op Assessment: Report given to PACU RN and Post -op Vital signs reviewed and stable  Post vital signs: Reviewed and stable  Complications: No apparent anesthesia complications

## 2012-08-02 ENCOUNTER — Encounter (HOSPITAL_COMMUNITY): Payer: Self-pay | Admitting: General Surgery

## 2012-08-02 DIAGNOSIS — N390 Urinary tract infection, site not specified: Secondary | ICD-10-CM | POA: Diagnosis present

## 2012-08-02 DIAGNOSIS — M869 Osteomyelitis, unspecified: Secondary | ICD-10-CM | POA: Diagnosis present

## 2012-08-02 DIAGNOSIS — A4101 Sepsis due to Methicillin susceptible Staphylococcus aureus: Principal | ICD-10-CM

## 2012-08-02 DIAGNOSIS — L89159 Pressure ulcer of sacral region, unspecified stage: Secondary | ICD-10-CM | POA: Diagnosis present

## 2012-08-02 LAB — BASIC METABOLIC PANEL
BUN: 5 mg/dL — ABNORMAL LOW (ref 6–23)
Creatinine, Ser: 0.41 mg/dL — ABNORMAL LOW (ref 0.50–1.35)
GFR calc Af Amer: >90 mL/min (ref >90)
GFR calc non Af Amer: >90 mL/min (ref >90)
Glucose, Bld: 92 mg/dL (ref 70–99)

## 2012-08-02 LAB — CBC
HCT: 26.2 % — ABNORMAL LOW (ref 39.0–52.0)
MCH: 27.6 pg (ref 26.0–34.0)
MCHC: 31.3 g/dL (ref 30.0–36.0)
RDW: 18.6 % — ABNORMAL HIGH (ref 11.5–15.5)

## 2012-08-02 LAB — CULTURE, BLOOD (ROUTINE X 2): Culture: NO GROWTH

## 2012-08-02 MED ORDER — UNJURY CHICKEN SOUP POWDER
8.0000 [oz_av] | Freq: Two times a day (BID) | ORAL | Status: DC
  Administered 2012-08-02 – 2012-08-15 (×9): 8 [oz_av] via ORAL
  Filled 2012-08-02 (×40): qty 27

## 2012-08-02 MED ORDER — MAGNESIUM SULFATE 50 % IJ SOLN
3.0000 g | Freq: Once | INTRAVENOUS | Status: AC
  Administered 2012-08-02: 3 g via INTRAVENOUS
  Filled 2012-08-02: qty 6

## 2012-08-02 MED ORDER — KETOROLAC TROMETHAMINE 30 MG/ML IJ SOLN
30.0000 mg | Freq: Three times a day (TID) | INTRAMUSCULAR | Status: AC | PRN
  Administered 2012-08-02 – 2012-08-04 (×4): 30 mg via INTRAVENOUS
  Filled 2012-08-02 (×4): qty 1

## 2012-08-02 MED ORDER — ENOXAPARIN SODIUM 30 MG/0.3ML ~~LOC~~ SOLN
30.0000 mg | SUBCUTANEOUS | Status: DC
  Administered 2012-08-02 – 2012-08-03 (×2): 30 mg via SUBCUTANEOUS
  Filled 2012-08-02 (×2): qty 0.3

## 2012-08-02 MED ORDER — SODIUM CHLORIDE 0.9 % IV SOLN
INTRAVENOUS | Status: DC
  Administered 2012-08-02: 11:00:00 via INTRAVENOUS

## 2012-08-02 NOTE — Consult Note (Signed)
WOC ostomy consult  Stoma type/location: LMQ COlostomy Stomal assessment/size: Pouch not removed today; stoma assessed through pouch so that supply can be obtained. Through Pouch, stoma is large, (2 and 1/4 inches round), edematous, light pink and moist. Stoma is not functioning.  No stool or flatus in pouch. Peristomal assessment: Not seen today Treatment options for stomal/peristomal skin: None indicated Output None Ostomy pouching: 2pc. , 2 and 3/4 inch placed in surgery is intact Education provided: patient aware that I will be one of his post operative resources.  While he and I know one another from my initial encounter as a wound care nurse, RN explained that I care for patients with both wounds and ostomies. I will follow along with you. Thanks, Ladona Mow, MSN, RN, [facility], CWOCN [phone])  WOC consult Note Follow up wound:  Patient's wound was debrided in OR, Rn reports that surgical wound dressing is intact and that surgical POC is for original surgical dressing to be changed after 1-2 days.  Surgeon (or his designee) will change first surgical dressing per house policy.  Patient refuses to turn off of back for observation of dressing (per nurse). I will remain available to this patient and his medical team for his wound and see for ostomy care. Thanks, Ladona Mow, MSN, RN, [facility], CWOCN [phone])

## 2012-08-02 NOTE — Progress Notes (Signed)
1 Day Post-Op  Subjective: Complains of pain. No nausea  Objective: Vital signs in last 24 hours: Temp:  [97.5 F (36.4 C)-99.9 F (37.7 C)] 99.9 F (37.7 C) (10/23 0000) Pulse Rate:  [62-102] 62  (10/23 0400) Resp:  [10-24] 10  (10/23 0414) BP: (102-125)/(63-79) 115/71 mmHg (10/23 0400) SpO2:  [93 %-100 %] 100 % (10/23 0414) Last BM Date: 07/25/12  Intake/Output from previous day: 10/22 0701 - 10/23 0700 In: 2220 [P.O.:60; I.V.:1860; IV Piggyback:300] Out: 4270 [Urine:4220; Blood:50] Intake/Output this shift:    GI: soft, appropriately tender. dressing clean. ostomy pink, no output yet  Lab Results:   Basename 08/01/12 0513 07/31/12 0420  WBC 5.4 5.5  HGB 7.2* 7.5*  HCT 22.7* 23.8*  PLT 225 268   BMET  Basename 08/01/12 0513 07/31/12 0420  NA 135 137  K 4.5 4.0  CL 106 108  CO2 23 23  GLUCOSE 79 90  BUN 5* 5*  CREATININE 0.45* 0.45*  CALCIUM 7.6* 7.6*   PT/INR No results found for this basename: LABPROT:2,INR:2 in the last 72 hours ABG No results found for this basename: PHART:2,PCO2:2,PO2:2,HCO3:2 in the last 72 hours  Studies/Results: No results found.  Anti-infectives: Anti-infectives     Start     Dose/Rate Route Frequency Ordered Stop   08/01/12 1300   meropenem (MERREM) 1 g in sodium chloride 0.9 % 100 mL IVPB  Status:  Discontinued        1 g 200 mL/hr over 30 Minutes Intravenous To Surgery 08/01/12 1039 08/01/12 1246   07/29/12 0100   meropenem (MERREM) 1 g in sodium chloride 0.9 % 100 mL IVPB        1 g 200 mL/hr over 30 Minutes Intravenous 3 times per day 07/29/12 0050     07/26/12 0500   vancomycin (VANCOCIN) 750 mg in sodium chloride 0.9 % 150 mL IVPB  Status:  Discontinued        750 mg 150 mL/hr over 60 Minutes Intravenous Every 12 hours 07/25/12 1958 07/28/12 1033   07/26/12 0200   piperacillin-tazobactam (ZOSYN) IVPB 3.375 g  Status:  Discontinued        3.375 g 12.5 mL/hr over 240 Minutes Intravenous Every 8 hours 07/25/12 1958  07/29/12 0043   07/25/12 1700   vancomycin (VANCOCIN) IVPB 1000 mg/200 mL premix        1,000 mg 200 mL/hr over 60 Minutes Intravenous  Once 07/25/12 1619 07/25/12 1807   07/25/12 1630   piperacillin-tazobactam (ZOSYN) IVPB 3.375 g  Status:  Discontinued        3.375 g 12.5 mL/hr over 240 Minutes Intravenous  Once 07/25/12 1619 07/25/12 1938          Assessment/Plan: s/p Procedure(s) (LRB) with comments: COLOSTOMY (N/A) - Open Diverting Colostomy HERNIA REPAIR INGUINAL ADULT (Left) DEBRIDEMENT WOUND (N/A) Add toradol for pain control Continue clears Out of bed  LOS: 8 days    TOTH III,PAUL S 08/02/2012

## 2012-08-02 NOTE — Progress Notes (Signed)
Pt refuses to take all PO meds.  Dr. Janee Morn made aware.  Pt refuses to let nurses turn him.  States his belly hurts too much and he can't do it today.

## 2012-08-02 NOTE — Progress Notes (Signed)
Nutrition Follow-up  Intervention:   Continue monitor closely. D/C Ensure while on clear liquids Continue Resource Breeze Add Unjury (chicken broth) bid (27 gm protein per packet)  Assessment:   Pt s/p diverting colostomy, hernia repair and debridement of stage IV sacral wound 10/22.  No output from colostomy yet.  No BM since admit.  Tolerating clear liquids.  Per nurse pt to remain on clear liquid until BM from colostomy.  Overall poor intake since admit.  Diet Order:  Clear liquid, advance diet as tolerated, Ensure Complete bid, Resource Breeze bid  Meds: Scheduled Meds:   . calcium-vitamin D  2 tablet Oral BID  . enoxaparin (LOVENOX) injection  30 mg Subcutaneous Q24H  . feeding supplement  237 mL Oral BID BM  . feeding supplement  1 Container Oral BID BM  . fentaNYL      . fentaNYL      . ferrous gluconate  324 mg Oral BID  . gabapentin  900 mg Oral TID  . HYDROmorphone PCA 0.3 mg/mL   Intravenous Q4H  . magnesium oxide  400 mg Oral BID  . magnesium sulfate 1 - 4 g bolus IVPB  3 g Intravenous Once  . meropenem (MERREM) IV  1 g Intravenous Q8H  . multivitamin with minerals  1 tablet Oral Daily  . nystatin  5 mL Oral QID  . oxybutynin  5 mg Oral TID  . phosphorus  500 mg Oral TID  . simvastatin  40 mg Oral QHS  . sodium chloride  10-40 mL Intracatheter Q12H  . vitamin C  500 mg Oral BID  . vitamin E  400 Units Oral Daily  . zinc sulfate  220 mg Oral Daily  . DISCONTD: meropenem (MERREM) IV  1 g Intravenous To OR  . DISCONTD: methadone  5 mg Oral BID   Continuous Infusions:   . sodium chloride 50 mL/hr at 08/02/12 1106  . DISCONTD: 0.9 % NaCl with KCl 40 mEq / L 50 mL/hr at 08/01/12 1400  . DISCONTD: lactated ringers     PRN Meds:.acetaminophen, acetaminophen, diphenhydrAMINE, diphenhydrAMINE, HYDROcodone-acetaminophen, HYDROmorphone (DILAUDID) injection, ketorolac, morphine injection, naloxone, ondansetron (ZOFRAN) IV, ondansetron, phenol, promethazine, sodium  chloride, sodium chloride, DISCONTD: fentaNYL, DISCONTD: ondansetron (ZOFRAN) IV, DISCONTD: promethazine  Labs:  CMP     Component Value Date/Time   NA 133* 08/02/2012 0800   K 4.7 08/02/2012 0800   CL 104 08/02/2012 0800   CO2 23 08/02/2012 0800   GLUCOSE 92 08/02/2012 0800   BUN 5* 08/02/2012 0800   CREATININE 0.41* 08/02/2012 0800   CALCIUM 8.2* 08/02/2012 0800   PROT 5.8* 08/01/2012 0513   ALBUMIN 1.0* 08/01/2012 0513   AST 17 08/01/2012 0513   ALT 10 08/01/2012 0513   ALKPHOS 100 08/01/2012 0513   BILITOT 0.4 08/01/2012 0513   GFRNONAA >90 08/02/2012 0800   GFRAA >90 08/02/2012 0800  Prealbumin 5.3.   Intake/Output Summary (Last 24 hours) at 08/02/12 1213 Last data filed at 08/02/12 1000  Gross per 24 hour  Intake   2170 ml  Output   2570 ml  Net   -400 ml    Weight Status:  94.6 kg 10/22 (range=89.4-94.6 kg)  Re-estimated needs:  2200-2400 kcal, 120-145 gm protein  Nutrition Dx:  Inadequate oral intake-continues  Goal:  Intake of >90% meals and supplements  Monitor:  Intake, labs, weight, wound, BM   Oran Rein, RD, LDN Clinical Inpatient Dietitian Pager:  [phone] Weekend and after hours pager:  [phone]

## 2012-08-02 NOTE — Progress Notes (Signed)
Pt still refusing to be turned.  [name] [name] B
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
## 2012-08-02 NOTE — Progress Notes (Signed)
[facility] PROGRESS NOTE  [name] [name] ZOX:[id number] DOB: 29-Dec-1975 DOA: 07/25/2012 PCP: No primary provider on file.  Assessment/Plan: 1. Septic shock secondary to osteomyelitis and MSSA bacteremia/ESBL in the urine, infectious disease following, bacteremia likely secondary to decubitus ulcer, patient was on vancomycin which was discontinued because of no MRSA isolation. Repeat blood cultures drawn on 10/18 still pending and do not reveal anything as yet-appreciate cardiology input. Cardiac echo done on 10/16 did not show any vegetations-Tee scheduled 07/31/12 is unable to be performed-repeat scheduled. Currently finishing course of therapy for ESBL in the urine and potentially narrowing per infectious disease input [consult appreciated] by Dr. Ilsa Iha. Continue IV merrem. Patient hesitant to undergo TEE with anesthesia as he says doesn't like anesthesia and has difficult time. ID and CCS ff and appreciate input and rxcs. 2. 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 [name] Appreciate input into patient's care-Vitamin C and E, Zinc added to meds for wound healing-to aid healing. WOC nurse consulted 10/21. Dr Mahala Menghini spoke with Dr. Kelly Splinter 10/21 and patient will need to be seen by her in the interim. Patient will need to outpatient followup at the wound care clinic on discharge. In the meanwhile, would recommend per general surgery's input a wound vacuum to keep the area clean and to promote healing. Wound care nurse already involved in patient's care. PCA morphine has been started by surgery-wean per general surgery. Toradol also started per general surgery. General surgery is following and appreciate input and recommendations.  3. MSSA bacteremia-repeat blood cultures pending. 2-D echo was negative for vegetations. TEE was unsuccessful under conscious sedation. Will likely need a TEE under  anesthesia per cardiology recommendations. Patient is a hesitant to undergo a TEE in the anesthesia. Will defer to ID. Continue IV merrem. 4. UTI secondary to ESBL, switched to meropenem 10/19, Zosyn discontinued. ID ff 5. Hypokalemia repleted-Bmet am 6. Acute kidney injury creatinine was 1.91, now improved to 0.66  7. Hypomagnesemia repleted. D/C KCL from IVF. BMET in AM. 8. Hypophosphatemia repleted-Rpt  9. Cardiomyopathy with an EF of 25-30%. Not a candidate for ACE inhibitor/beta blocker given low blood pressure/renal insufficiency-we'll obtain records from [place] where patient was incarcerated-patient states that he saw a cardiologist by the name of Dr. Elwyn Reach who recommended this for him.  10. Elevated alkaline phosphatase-probably secondary to bone remodeling.  11. Likely Anemia of Chronic disease/ Iron deficiency anemia-Anemia panel 10/15 showed this. Would not transfuse unless peri-operative per Gen surgeon. Transfusion threshold is below 7.0. May need a dose of IV iron. Follow H/H. 12. Poor nutritional status-Albumin on Cmet was 0.9. Prealbumin 5.3. Once tolerating PO will need nuitrition consult. 13. Surgical absence of R kidney-potentially use Aranesp-would get dosing from either hematology or oncology or nephrology  14. T10 paraplegia-needs significant therapy with occupational and physical therapy and potentially might benefit from inpatient rehabilitation  15. Prophylaxis Lovenox for DVT prophylaxis      Code Status: Full Family Communication: Updated patient at bedside. No family present. Disposition Plan: SNF vs Home when medically stable   Consultants: Gen surgery : Dr Michaell Cowing 07/25/12 Plastic Surgery:  Lazaro Arms, PA 07/31/12 Infectious disease  Dr Drue Second 07/27/12 Cardiology PCCM : Dr Kendrick Fries 07/25/12  Procedures: Peripheral(refused CVL x 3)  Dopamine infusion discontinued 07/28/2012  Has PICC placed 10/20  CT abdomen and pelvis 07/28/2012 CT of the L-spine  07/25/2012 Chest x-ray 07/25/2012 2-D echo 07/26/2012 Arthrocentesis of left hip joint effusion 07/30/2012 (Diverting  colostomy/left inguinal hernia repair/wound debridement per Dr. Carolynne Edouard and Donell Beers 08/01/2012   Cultures / Sepsis markers:  10/15 blood >>gpc>>1/2 sa res to pcn but oxacillin ss  10/15 urine >>ESBL  10/15 wound>>gpc>>SA>>pan ss  10/18 Blood >>neg till date  10/20 Hip aspirate cultures negative at present         Antibiotics: per ID 10/15 vanc (sacral ulcer) >>10/18  10/15 zosyn (sacral ulcer)(gnr urine) >> 10/18  10/18 Meropenem>>>   HPI/Subjective: Patient complaining of discomfort aroung ostomy site. On PCA pump.  Objective: Filed Vitals:   08/02/12 0000 08/02/12 0400 08/02/12 0414 08/02/12 0800  BP: 102/63 115/71    Pulse:  62    Temp: 99.9 F (37.7 C)     TempSrc: Oral     Resp: 13 13 10 16   Height:      Weight:      SpO2: 100% 100% 100% 100%    Intake/Output Summary (Last 24 hours) at 08/02/12 1010 Last data filed at 08/02/12 9604  Gross per 24 hour  Intake   2170 ml  Output   3070 ml  Net   -900 ml   Filed Weights   07/25/12 1447 07/25/12 1930 08/01/12 0523  Weight: 89.359 kg (197 lb) 79.6 kg (175 lb 7.8 oz) 94.6 kg (208 lb 8.9 oz)    Exam:   General:  Sitting up in bed in no NAD  Cardiovascular: RRR  Respiratory: CTAB. No LE edema  Abdomen: Soft/NT/ND/+BS/OSTOMY PINK, NO OUTPUT  Data Reviewed: Basic Metabolic Panel:  Lab 08/02/12 5409 08/01/12 0513 07/31/12 0420 07/30/12 0339 07/29/12 0110 07/28/12 0308 07/27/12 0325  NA 133* 135 137 134* 134* -- --  K 4.7 4.5 4.0 3.6 3.3* -- --  CL 104 106 108 106 107 -- --  CO2 23 23 23 21 19  -- --  GLUCOSE 92 79 90 86 85 -- --  BUN 5* 5* 5* 5* 9 -- --  CREATININE 0.41* 0.45* 0.45* 0.58 0.66 -- --  CALCIUM 8.2* 7.6* 7.6* 7.2* 7.3* -- --  MG 1.4* 1.4* -- -- 1.4* 1.2* 1.4*  PHOS -- 3.0 -- -- 2.3 2.7 3.3   Liver Function Tests:  Lab 08/01/12 0513 07/31/12 0420  AST 17 20  ALT 10  10  ALKPHOS 100 106  BILITOT 0.4 0.5  PROT 5.8* 5.8*  ALBUMIN 1.0* 0.9*   No results found for this basename: LIPASE:5,AMYLASE:5 in the last 168 hours No results found for this basename: AMMONIA:5 in the last 168 hours CBC:  Lab 08/02/12 0800 08/01/12 0513 07/31/12 0420 07/30/12 0757 07/29/12 0110  WBC 8.4 5.4 5.5 7.2 8.6  NEUTROABS -- -- -- -- --  HGB 8.2* 7.2* 7.5* 8.0* 7.6*  HCT 26.2* 22.7* 23.8* 24.8* 23.5*  MCV 88.2 87.6 85.6 84.4 83.6  PLT 281 225 268 259 247   Cardiac Enzymes: No results found for this basename: CKTOTAL:5,CKMB:5,CKMBINDEX:5,TROPONINI:5 in the last 168 hours BNP (last 3 results) No results found for this basename: PROBNP:3 in the last 8760 hours CBG:  Lab 08/01/12 1228 08/01/12 1152 08/01/12 1102 08/01/12 0958  GLUCAP 125* 143* 76 56*    Recent Results (from the past 240 hour(s))  CULTURE, BLOOD (ROUTINE X 2)     Status: Normal   Collection Time   07/25/12  4:10 PM      Component Value Range Status Comment   Specimen Description BLOOD LEFT ARM   Final    Special Requests BOTTLES DRAWN AEROBIC ONLY 3CC   Final  Culture  Setup Time 07/25/2012 22:13   Final    Culture     Final    Value: STAPHYLOCOCCUS AUREUS     Note: RIFAMPIN AND GENTAMICIN SHOULD NOT BE USED AS SINGLE DRUGS FOR TREATMENT OF STAPH INFECTIONS.     Note: Gram Stain Report Called to,Read Back By and Verified With: STEPHANIE DILLON 07/26/12 0830 BY SMITHERSJ   Report Status 07/28/2012 FINAL   Final    Organism ID, Bacteria STAPHYLOCOCCUS AUREUS   Final   CULTURE, BLOOD (ROUTINE X 2)     Status: Normal   Collection Time   07/25/12  6:00 PM      Component Value Range Status Comment   Specimen Description BLOOD LEFT ARM   Final    Special Requests BOTTLES DRAWN AEROBIC ONLY 3CC   Final    Culture  Setup Time 07/25/2012 22:13   Final    Culture NO GROWTH 5 DAYS   Final    Report Status 07/31/2012 FINAL   Final   URINE CULTURE     Status: Normal   Collection Time   07/25/12  6:50 PM       Component Value Range Status Comment   Specimen Description URINE, RANDOM   Final    Special Requests NONE   Final    Culture  Setup Time 07/26/2012 01:47   Final    Colony Count >=100,000 COLONIES/ML   Final    Culture     Final    Value: ESCHERICHIA COLI     Note: Confirmed Extended Spectrum Beta-Lactamase Producer (ESBL) CRITICAL RESULT CALLED TO, READ BACK BY AND VERIFIED WITH: VANCE JOHNSON @ 0025 ON 07/29/2012 HAJAM   Report Status 07/29/2012 FINAL   Final    Organism ID, Bacteria ESCHERICHIA COLI   Final   WOUND CULTURE     Status: Normal   Collection Time   07/25/12  8:10 PM      Component Value Range Status Comment   Specimen Description SACRAL   Final    Special Requests Normal   Final    Gram Stain     Final    Value: MODERATE WBC PRESENT,BOTH PMN AND MONONUCLEAR     NO SQUAMOUS EPITHELIAL CELLS SEEN     RARE GRAM NEGATIVE RODS     RARE GRAM POSITIVE RODS     RARE GRAM POSITIVE COCCI IN PAIRS   Culture     Final    Value: FEW STAPHYLOCOCCUS AUREUS     Note: RIFAMPIN AND GENTAMICIN SHOULD NOT BE USED AS SINGLE DRUGS FOR TREATMENT OF STAPH INFECTIONS.   Report Status 07/28/2012 FINAL   Final    Organism ID, Bacteria STAPHYLOCOCCUS AUREUS   Final   MRSA PCR SCREENING     Status: Abnormal   Collection Time   07/25/12  8:13 PM      Component Value Range Status Comment   MRSA by PCR POSITIVE (*) NEGATIVE Final   CULTURE, BLOOD (ROUTINE X 2)     Status: Normal   Collection Time   07/27/12  3:47 PM      Component Value Range Status Comment   Specimen Description BLOOD LEFT ARM   Final    Special Requests BOTTLES DRAWN AEROBIC ONLY 5CC   Final    Culture  Setup Time 07/27/2012 22:30   Final    Culture NO GROWTH 5 DAYS   Final    Report Status 08/02/2012 FINAL   Final   CULTURE, BLOOD (ROUTINE X  2)     Status: Normal   Collection Time   07/27/12  4:00 PM      Component Value Range Status Comment   Specimen Description BLOOD LEFT ARM   Final    Special Requests  BOTTLES DRAWN AEROBIC ONLY 3CC   Final    Culture  Setup Time 07/27/2012 22:30   Final    Culture NO GROWTH 5 DAYS   Final    Report Status 08/02/2012 FINAL   Final   BODY FLUID CULTURE     Status: Normal (Preliminary result)   Collection Time   07/30/12 11:59 AM      Component Value Range Status Comment   Specimen Description SHOULDER JOINT   Final    Special Requests Normal   Final    Gram Stain     Final    Value: NO WBC SEEN     NO ORGANISMS SEEN   Culture NO GROWTH 2 DAYS   Final    Report Status PENDING   Incomplete      Studies: No results found.  Scheduled Meds:   . calcium-vitamin D  2 tablet Oral BID  . feeding supplement  237 mL Oral BID BM  . feeding supplement  1 Container Oral BID BM  . fentaNYL      . fentaNYL      . ferrous gluconate  324 mg Oral BID  . gabapentin  900 mg Oral TID  . HYDROmorphone PCA 0.3 mg/mL   Intravenous Q4H  . magnesium oxide  400 mg Oral BID  . meropenem (MERREM) IV  1 g Intravenous Q8H  . methadone  5 mg Oral BID  . multivitamin with minerals  1 tablet Oral Daily  . nystatin  5 mL Oral QID  . oxybutynin  5 mg Oral TID  . phosphorus  500 mg Oral TID  . simvastatin  40 mg Oral QHS  . sodium chloride  10-40 mL Intracatheter Q12H  . vitamin C  500 mg Oral BID  . vitamin E  400 Units Oral Daily  . zinc sulfate  220 mg Oral Daily  . DISCONTD: meropenem (MERREM) IV  1 g Intravenous To OR   Continuous Infusions:   . 0.9 % NaCl with KCl 40 mEq / L 50 mL/hr at 08/01/12 1400  . DISCONTD: lactated ringers      Principal Problem:  *SIRS (systemic inflammatory response syndrome) Active Problems:  Sacral decubitus ulcer, stage IV  Paraplegia following T10spinal cord injury  Anemia  Cardiomyopathy  Hyperkalemia  Acute renal failure  Sepsis  Inguinal hernia, left  Septic shock(785.52)  Staphylococcus aureus bacteremia with sepsis  UTI (lower urinary tract infection)  Osteomyelitis of pelvic region    Time spent: > 35  mins    [facility]  [facility] Pager [phone]. If 8PM-8AM, please contact night-coverage at [email or web address], password [facility] 08/02/2012, 10:10 AM  LOS: 8 days

## 2012-08-03 DIAGNOSIS — M869 Osteomyelitis, unspecified: Secondary | ICD-10-CM

## 2012-08-03 LAB — MAGNESIUM: Magnesium: 1.8 mg/dL (ref 1.5–2.5)

## 2012-08-03 LAB — CBC
MCH: 27.4 pg (ref 26.0–34.0)
MCHC: 30.5 g/dL (ref 30.0–36.0)
MCV: 89.7 fL (ref 78.0–100.0)
Platelets: 228 10*3/uL (ref 150–400)
RBC: 2.63 MIL/uL — ABNORMAL LOW (ref 4.22–5.81)
RDW: 18.5 % — ABNORMAL HIGH (ref 11.5–15.5)

## 2012-08-03 LAB — BASIC METABOLIC PANEL
CO2: 23 mEq/L (ref 19–32)
Calcium: 7.8 mg/dL — ABNORMAL LOW (ref 8.4–10.5)
Creatinine, Ser: 0.35 mg/dL — ABNORMAL LOW (ref 0.50–1.35)
GFR calc non Af Amer: >90 mL/min (ref >90)
Glucose, Bld: 97 mg/dL (ref 70–99)
Sodium: 135 mEq/L (ref 135–145)

## 2012-08-03 MED ORDER — CHOLECALCIFEROL 10 MCG (400 UNIT) PO TABS
400.0000 [IU] | ORAL_TABLET | Freq: Two times a day (BID) | ORAL | Status: DC
  Administered 2012-08-03 – 2012-08-21 (×31): 400 [IU] via ORAL
  Filled 2012-08-03 (×39): qty 1

## 2012-08-03 MED ORDER — ENOXAPARIN SODIUM 40 MG/0.4ML ~~LOC~~ SOLN
40.0000 mg | SUBCUTANEOUS | Status: DC
  Administered 2012-08-04 – 2012-08-10 (×6): 40 mg via SUBCUTANEOUS
  Filled 2012-08-03 (×8): qty 0.4

## 2012-08-03 MED ORDER — METHADONE HCL 10 MG PO TABS
10.0000 mg | ORAL_TABLET | Freq: Every day | ORAL | Status: DC
  Administered 2012-08-03 – 2012-08-08 (×6): 10 mg via ORAL
  Filled 2012-08-03: qty 2
  Filled 2012-08-03 (×5): qty 1

## 2012-08-03 MED ORDER — METRONIDAZOLE 500 MG PO TABS
500.0000 mg | ORAL_TABLET | Freq: Three times a day (TID) | ORAL | Status: DC
  Administered 2012-08-03 – 2012-08-11 (×17): 500 mg via ORAL
  Filled 2012-08-03 (×35): qty 1

## 2012-08-03 MED ORDER — CALCIUM CARBONATE 1250 MG/5ML PO SUSP
1000.0000 mg | Freq: Two times a day (BID) | ORAL | Status: DC
  Administered 2012-08-03 – 2012-08-21 (×31): 1000 mg via ORAL
  Filled 2012-08-03 (×40): qty 10

## 2012-08-03 MED ORDER — MAGNESIUM SULFATE 40 MG/ML IJ SOLN
2.0000 g | Freq: Once | INTRAMUSCULAR | Status: AC
  Administered 2012-08-03: 2 g via INTRAVENOUS
  Filled 2012-08-03: qty 50

## 2012-08-03 MED ORDER — CEFAZOLIN SODIUM-DEXTROSE 2-3 GM-% IV SOLR
2.0000 g | Freq: Three times a day (TID) | INTRAVENOUS | Status: DC
  Administered 2012-08-03 – 2012-08-10 (×22): 2 g via INTRAVENOUS
  Filled 2012-08-03 (×27): qty 50

## 2012-08-03 NOTE — Progress Notes (Signed)
2 Days Post-Op  Subjective: Feeling better. Less pain. Wants more food  Objective: Vital signs in last 24 hours: Temp:  [97.3 F (36.3 C)-97.7 F (36.5 C)] 97.7 F (36.5 C) (10/24 0800) Pulse Rate:  [61-93] 93  (10/24 1000) Resp:  [10-18] 14  (10/24 1212) BP: (102-114)/(54-64) 103/64 mmHg (10/24 0800) SpO2:  [97 %-100 %] 100 % (10/24 1212) Weight:  [198 lb 10.2 oz (90.1 kg)] 198 lb 10.2 oz (90.1 kg) (10/24 0000) Last BM Date: 07/25/12  Intake/Output from previous day: 10/23 0701 - 10/24 0700 In: 1564 [P.O.:600; I.V.:614; IV Piggyback:300] Out: 2162 [Urine:2162] Intake/Output this shift: Total I/O In: 170 [I.V.:10; Other:160] Out: 600 [Urine:600]  GI: soft, mild tenderness. ostomy pink, no output yet  Lab Results:   Basename 08/03/12 0355 08/02/12 0800  WBC 5.8 8.4  HGB 7.2* 8.2*  HCT 23.6* 26.2*  PLT 228 281   BMET  Basename 08/03/12 0355 08/02/12 0800  NA 135 133*  K 4.6 4.7  CL 106 104  CO2 23 23  GLUCOSE 97 92  BUN 7 5*  CREATININE 0.35* 0.41*  CALCIUM 7.8* 8.2*   PT/INR No results found for this basename: LABPROT:2,INR:2 in the last 72 hours ABG No results found for this basename: PHART:2,PCO2:2,PO2:2,HCO3:2 in the last 72 hours  Studies/Results: No results found.  Anti-infectives: Anti-infectives     Start     Dose/Rate Route Frequency Ordered Stop   08/01/12 1300   meropenem (MERREM) 1 g in sodium chloride 0.9 % 100 mL IVPB  Status:  Discontinued        1 g 200 mL/hr over 30 Minutes Intravenous To Surgery 08/01/12 1039 08/01/12 1246   07/29/12 0100   meropenem (MERREM) 1 g in sodium chloride 0.9 % 100 mL IVPB        1 g 200 mL/hr over 30 Minutes Intravenous 3 times per day 07/29/12 0050     07/26/12 0500   vancomycin (VANCOCIN) 750 mg in sodium chloride 0.9 % 150 mL IVPB  Status:  Discontinued        750 mg 150 mL/hr over 60 Minutes Intravenous Every 12 hours 07/25/12 1958 07/28/12 1033   07/26/12 0200   piperacillin-tazobactam (ZOSYN) IVPB  3.375 g  Status:  Discontinued        3.375 g 12.5 mL/hr over 240 Minutes Intravenous Every 8 hours 07/25/12 1958 07/29/12 0043   07/25/12 1700   vancomycin (VANCOCIN) IVPB 1000 mg/200 mL premix        1,000 mg 200 mL/hr over 60 Minutes Intravenous  Once 07/25/12 1619 07/25/12 1807   07/25/12 1630   piperacillin-tazobactam (ZOSYN) IVPB 3.375 g  Status:  Discontinued        3.375 g 12.5 mL/hr over 240 Minutes Intravenous  Once 07/25/12 1619 07/25/12 1938          Assessment/Plan: s/p Procedure(s) (LRB) with comments: COLOSTOMY (N/A) - Open Diverting Colostomy HERNIA REPAIR INGUINAL ADULT (Left) DEBRIDEMENT WOUND (N/A) Start Full Liquid diet  LOS: 9 days    TOTH III,PAUL S 08/03/2012

## 2012-08-03 NOTE — Evaluation (Signed)
Physical Therapy Evaluation Patient Details Name: [name] [name] MRN: [id number] DOB: June 14, 1976 Today's Date: 08/03/2012 Time: 7829-5621 PT Time Calculation (min): 28 min  PT Assessment / Plan / Recommendation Clinical Impression  pt is a T10 paraplegic since 1993, states he received no rehab after his injury at that time...unsure of accuracy of this.Marland KitchenMarland Kitchenpt was just released from prison approximately one month ago after 7 yrs, he did have physical therapy during his incarceration;  Pt is now hospitalized due to stage 4 sacral wound and sepsis; Will benefit formPT to maximize independence and prevent further complications related to immobiliity     PT Assessment  Patient needs continued PT services    Follow Up Recommendations  Post acute inpatient    Does the patient have the potential to tolerate intense rehabilitation   No, Recommend LTACH (vs STSNF)  Barriers to Discharge        Equipment Recommendations  None recommended by PT    Recommendations for Other Services     Frequency Min 2X/week    Precautions / Restrictions Precautions Precautions: Other (comment) Precaution Comments: large stage 4 sacral wound   Pertinent Vitals/Pain VSS      Mobility  Bed Mobility Details for Bed Mobility Assistance: Deferred 2* pain. Pt cried out upon PROM of LEs due to soreness from recent diverting colostomy surgery; This PT has observed pt bed mobility  (during hydrotherapy) and he is able to assist with rolling right and left at a min to mod assist level using the bed rail. level of assist depends on his level  of cooperation and participation Transfers Transfers: Not assessed (for reasons noted above)    Shoulder Instructions     Exercises     PT Diagnosis: Acute pain  PT Problem List: Decreased activity tolerance;Decreased mobility PT Treatment Interventions: Functional mobility training;Therapeutic activities;Balance training;Patient/family education   PT Goals Acute  Rehab PT Goals PT Goal Formulation: With patient Time For Goal Achievement: 08/17/12 Potential to Achieve Goals: Fair Pt will Roll Supine to Right Side: with modified independence PT Goal: Rolling Supine to Right Side - Progress: Goal set today Pt will Roll Supine to Left Side: with modified independence PT Goal: Rolling Supine to Left Side - Progress: Goal set today Pt will go Supine/Side to Sit: with min assist PT Goal: Supine/Side to Sit - Progress: Goal set today Pt will Sit at Edge of Bed: 3-5 min;with no upper extremity support;with min assist PT Goal: Sit at Edge Of Bed - Progress: Goal set today Pt will Transfer Bed to Chair/Chair to Bed: with mod assist PT Transfer Goal: Bed to Chair/Chair to Bed - Progress: Goal set today  Visit Information  Last PT Received On: 08/03/12 Assistance Needed: +2    Subjective Data  Subjective: who asked you to do this?   Prior Functioning  Home Living Lives With: Other (Comment) (Mother) Bathroom Shower/Tub: Engineer, manufacturing systems: Standard Home Adaptive Equipment: Wheelchair - manual;Hospital bed;Shower chair without back Additional Comments: Camera operator and wheelchair issued in prison ( they are fairly new per pt); pt self caths; Pt received PT while in prison but states he was more independent then and wound was more manageable; Prior Function Level of Independence: Independent with assistive device(s) Able to Take Stairs?: No Driving: No Vocation: Unemployed Comments: Pt states he was able to transfer himself from bed<>w/c<>toilet independently by boosting self with UEs. Pt disempacts himself on toilet. States he is not returning to his mother's home upon d/c Communication Communication: No difficulties  Dominant Hand: Right    Cognition  Overall Cognitive Status: Appears within functional limits for tasks assessed/performed Arousal/Alertness: Awake/alert Orientation Level: Appears intact for tasks assessed Behavior  During Session: [facility] for tasks performed    Extremity/Trunk Assessment Right Upper Extremity Assessment RUE ROM/Strength/Tone: [facility] for tasks assessed Left Upper Extremity Assessment LUE ROM/Strength/Tone: WFL for tasks assessed Right Lower Extremity Assessment RLE ROM/Strength/Tone: Deficits RLE ROM/Strength/Tone Deficits: PROM grossly WFL; T10 paraplegic since 1993 RLE Sensation: Deficits RLE Coordination: Deficits Left Lower Extremity Assessment LLE ROM/Strength/Tone: Deficits LLE ROM/Strength/Tone Deficits: PROM grossly WFL;  T10 paraplegic since 1993 LLE Sensation: Deficits LLE Coordination: Deficits   Balance Balance Balance Assessed: No (for reasons noted above)  End of Session PT - End of Session Activity Tolerance: Patient limited by pain Patient left: in bed;with family/visitor present  GP     [facility] 08/03/2012, 3:42 PM

## 2012-08-03 NOTE — Evaluation (Signed)
Occupational Therapy Evaluation Patient Details Name: [name] [name] MRN: [id number] DOB: 1976/03/10 Today's Date: 08/03/2012 Time: 4098-1191 OT Time Calculation (min): 28 min  OT Assessment / Plan / Recommendation Clinical Impression  Pt is a 36 yo male, T12 para since '93 who presents with large stage 4 decub with debridement, COLOSTOMY (N/A) - Open Diverting Colostomy and inguenal hernia repair. Pt also with several other co-morbidities including cardiac defibrillator placement and frequent UTIs. Pt stated he never had any significant therapy following injury and was then incarcerated 7 years ago. Pt is open to snf or LTACH placement which is what this therapist recommends. Do not feel pt is able to be mobilized OOB at this point without a specialized air or gel cushion (such as a ROJO) which pts mom is bringing from home 2* the severity of his wound. Further shearing or skin breakdown would increase the risk for infection. Pt declined to mobilize in bed today 2* pain. Will follow acutely to address the below stated goals in prep for d/c to next venue of care.    OT Assessment  Patient needs continued OT Services    Follow Up Recommendations  Skilled nursing facility;LTACH    Barriers to Discharge Decreased caregiver support    Equipment Recommendations  Other (comment) (TBD)    Recommendations for Other Services    Frequency  Min 1X/week    Precautions / Restrictions Precautions Precautions: Other (comment) Precaution Comments: large stage 4 sacral wound   Pertinent Vitals/Pain Pt reported abdominal pain which he did not rate upon PROM of LEs.    ADL  Eating/Feeding: Simulated;Set up Where Assessed - Eating/Feeding: Bed level Grooming: Simulated;Set up Where Assessed - Grooming: Supine, head of bed up ADL Comments: Pt requires total A for LB ADLs. PT states pt is able to roll with min A. Pt currently with colostomy bag and foley. Unable to mobilize pt due to pain.    OT  Diagnosis: Paresis  OT Problem List: Decreased activity tolerance;Decreased knowledge of use of DME or AE;Pain;Impaired tone OT Treatment Interventions: Self-care/ADL training;Therapeutic activities;Therapeutic exercise;DME and/or AE instruction;Patient/family education;Balance training   OT Goals Acute Rehab OT Goals OT Goal Formulation: With patient Time For Goal Achievement: 08/17/12 Potential to Achieve Goals: Fair ADL Goals Pt Will Transfer to Toilet: with mod assist Arm Goals Pt Will Complete Theraband Exer: Independently;Bilateral upper extremities;to maintain strength;15 reps;3 sets;Level 4 Theraband Arm Goal: Theraband Exercises - Progress: Goal set today Miscellaneous OT Goals Miscellaneous OT Goal #1: Pt will tolerate unsupported sitting EOB x8 min with SBA in prep for seated ADL. OT Goal: Miscellaneous Goal #1 - Progress: Goal set today Miscellaneous OT Goal #2: Pt will complete transfers from bed<>chair/drop arm 3:1/w/c using boost and scoot/slide board method with min A. OT Goal: Miscellaneous Goal #2 - Progress: Goal set today  Visit Information  Last OT Received On: 08/03/12 Assistance Needed: +2 PT/OT Co-Evaluation/Treatment: Yes    Subjective Data  Subjective: Who sent y'all in here. Patient Stated Goal: I need to get this wound taken care of.   Prior Functioning     Home Living Lives With: Other (Comment) (Mother) Bathroom Shower/Tub: Engineer, manufacturing systems: Standard Home Adaptive Equipment: Wheelchair - manual;Hospital bed;Shower chair without back Additional Comments: Camera operator and wheelchair issued in prison ( they are fairly new per pt); pt self caths; Pt received PT while in prison but states he was more independent then and wound was more manageable; Prior Function Level of Independence: Independent with assistive device(s) Able  to Take Stairs?: No Driving: No Vocation: Unemployed Comments: Pt states he was able to transfer himself from  bed<>w/c<>toilet independently by boosting self with UEs. Pt disempacts himself on toilet. States he is not returning to his mother's home upon d/c Communication Communication: No difficulties Dominant Hand: Right         Vision/Perception     Cognition  Overall Cognitive Status: Appears within functional limits for tasks assessed/performed Arousal/Alertness: Awake/alert Orientation Level: Appears intact for tasks assessed Behavior During Session: [facility] for tasks performed    Extremity/Trunk Assessment Right Upper Extremity Assessment RUE ROM/Strength/Tone: [facility] for tasks assessed Left Upper Extremity Assessment LUE ROM/Strength/Tone: WFL for tasks assessed Right Lower Extremity Assessment RLE ROM/Strength/Tone: Deficits RLE ROM/Strength/Tone Deficits: PROM grossly WFL; T10 paraplegic since 1993 RLE Sensation: Deficits RLE Coordination: Deficits Left Lower Extremity Assessment LLE ROM/Strength/Tone: Deficits LLE ROM/Strength/Tone Deficits: PROM grossly WFL;  T10 paraplegic since 1993 LLE Sensation: Deficits LLE Coordination: Deficits     Mobility Bed Mobility Details for Bed Mobility Assistance: Deferred 2* pain. Pt cried out upon PROM of LEs due to soreness from recent diverting colostomy surgery; This PT has observed pt bed mobility  (during hydrotherapy) and he is able to assist with rolling right and left at a min to mod assist level using the bed rail. level of assist depends on his level  of cooperation and participation     Shoulder Instructions     Exercise     Balance Balance Balance Assessed: No (for reasons noted above)   End of Session OT - End of Session Activity Tolerance: Patient limited by pain Patient left: in bed;with call bell/phone within reach;with family/visitor present  GO     Jessaca Philippi A OTR/L [phone] 08/03/2012, 3:39 PM

## 2012-08-03 NOTE — Progress Notes (Signed)
[facility] PROGRESS NOTE  [name] [name] YQM:[id number] DOB: 08/19/1976 DOA: 07/25/2012 PCP: No primary provider on file.  Assessment/Plan: 1. Septic shock secondary to osteomyelitis and MSSA bacteremia/ESBL in the urine, infectious disease following, bacteremia likely secondary to decubitus ulcer, patient was on vancomycin which was discontinued because of no MRSA isolation. Repeat blood cultures drawn on 10/18 still pending and do not reveal anything as yet-appreciate cardiology input. Cardiac echo done on 10/16 did not show any vegetations-Tee scheduled 07/31/12 is unable to be performed-repeat scheduled. Currently finishing course of therapy for ESBL in the urine and potentially narrowing per infectious disease input [consult appreciated] by Dr. Ilsa Iha. Continue IV merrem. Patient hesitant to undergo TEE with anesthesia as he says doesn't like anesthesia and has difficult time. ID and CCS ff and appreciate input and rxcs. 2. 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 [name] Appreciate input into patient's care-Vitamin C and E, Zinc added to meds for wound healing-to aid healing. WOC nurse consulted 10/21. Dr Mahala Menghini spoke with Dr. Kelly Splinter 10/21 and patient will need to be seen by her in the interim. Patient will need to outpatient followup at the wound care clinic on discharge. In the meanwhile, would recommend per general surgery's input a wound vacuum to keep the area clean and to promote healing. Wound care nurse already involved in patient's care. PCA morphine has been started by surgery-wean per general surgery. Toradol also started per general surgery. General surgery is following and appreciate input and recommendations.  3. MSSA bacteremia-repeat blood cultures pending. 2-D echo was negative for vegetations. TEE was unsuccessful under conscious sedation. Will likely need a TEE under  anesthesia per cardiology recommendations. Patient is a hesitant to undergo a TEE in the anesthesia. Will defer to ID. Continue IV merrem. 4. UTI secondary to ESBL, switched to meropenem 10/19, Zosyn discontinued. ID ff 5. Hypokalemia repleted-Bmet am 6. Acute kidney injury creatinine was 1.91, now improved to 0.35. NSL IVF.  7. Hypomagnesemia repleted. D/C KCL from IVF. BMET in AM. 8. Hypophosphatemia repleted-Rpt  9. Cardiomyopathy with an EF of 25-30%. Not a candidate for ACE inhibitor/beta blocker given low blood pressure/renal insufficiency-we'll obtain records from [place] where patient was incarcerated-patient states that he saw a cardiologist by the name of Dr. Elwyn Reach who recommended this for him.  10. Elevated alkaline phosphatase-probably secondary to bone remodeling.  11. Likely Anemia of Chronic disease/ Iron deficiency anemia-Anemia panel 10/15 showed this. Would not transfuse unless peri-operative per Gen surgeon. Transfusion threshold is below 7.0. May need a dose of IV iron. Follow H/H. 12. Poor nutritional status-Albumin on Cmet was 0.9. Prealbumin 5.3. Once tolerating PO will need nuitrition consult. 13. Surgical absence of R kidney-potentially may need Aranesp-would get dosing from either hematology or oncology or nephrology  14. T10 paraplegia-needs significant therapy with occupational and physical therapy and potentially might benefit from inpatient rehabilitation  15. Prophylaxis Lovenox for DVT prophylaxis      Code Status: Full Family Communication: Updated patient at bedside. No family present. Disposition Plan: SNF vs Home when medically stable   Consultants: Gen surgery : Dr Michaell Cowing 07/25/12 Plastic Surgery:  Lazaro Arms, PA 07/31/12 Infectious disease  Dr Drue Second 07/27/12 Cardiology PCCM : Dr Kendrick Fries 07/25/12  Procedures: Peripheral(refused CVL x 3)  Dopamine infusion discontinued 07/28/2012  Has PICC placed 10/20  CT abdomen and pelvis 07/28/2012 CT of  the L-spine 07/25/2012 Chest x-ray 07/25/2012 2-D echo 07/26/2012 Arthrocentesis of left hip joint  effusion 07/30/2012 (Diverting colostomy/left inguinal hernia repair/wound debridement per Dr. Carolynne Edouard and Donell Beers 08/01/2012   Cultures / Sepsis markers:  10/15 blood >>gpc>>1/2 sa res to pcn but oxacillin ss  10/15 urine >>ESBL  10/15 wound>>gpc>>SA>>pan ss  10/18 Blood >>neg till date  10/20 Hip aspirate cultures negative at present         Antibiotics: per ID 10/15 vanc (sacral ulcer) >>10/18  10/15 zosyn (sacral ulcer)(gnr urine) >> 10/18  10/18 Meropenem>>>   HPI/Subjective: Patient complaining of discomfort aroung ostomy site. On PCA pump.  Objective: Filed Vitals:   08/03/12 0000 08/03/12 0353 08/03/12 0500 08/03/12 0800  BP: 102/64  114/61 103/64  Pulse:   69 72  Temp: 97.3 F (36.3 C)   97.7 F (36.5 C)  TempSrc: Oral   Oral  Resp: 18 14 10 15   Height:      Weight: 90.1 kg (198 lb 10.2 oz)     SpO2: 100% 100% 100% 100%    Intake/Output Summary (Last 24 hours) at 08/03/12 1012 Last data filed at 08/03/12 0600  Gross per 24 hour  Intake 1514.03 ml  Output   1812 ml  Net -297.97 ml   Filed Weights   07/25/12 1930 08/01/12 0523 08/03/12 0000  Weight: 79.6 kg (175 lb 7.8 oz) 94.6 kg (208 lb 8.9 oz) 90.1 kg (198 lb 10.2 oz)    Exam:   General:  Sitting up in bed in no NAD  Cardiovascular: RRR  Respiratory: CTAB. No LE edema  Abdomen: Soft/NT/ND/+BS/OSTOMY PINK, NO OUTPUT  Data Reviewed: Basic Metabolic Panel:  Lab 08/03/12 1610 08/02/12 0800 08/01/12 0513 07/31/12 0420 07/30/12 0339 07/29/12 0110 07/28/12 0308  NA 135 133* 135 137 134* -- --  K 4.6 4.7 4.5 4.0 3.6 -- --  CL 106 104 106 108 106 -- --  CO2 23 23 23 23 21  -- --  GLUCOSE 97 92 79 90 86 -- --  BUN 7 5* 5* 5* 5* -- --  CREATININE 0.35* 0.41* 0.45* 0.45* 0.58 -- --  CALCIUM 7.8* 8.2* 7.6* 7.6* 7.2* -- --  MG 1.8 1.4* 1.4* -- -- 1.4* 1.2*  PHOS -- -- 3.0 -- -- 2.3 2.7   Liver  Function Tests:  Lab 08/01/12 0513 07/31/12 0420  AST 17 20  ALT 10 10  ALKPHOS 100 106  BILITOT 0.4 0.5  PROT 5.8* 5.8*  ALBUMIN 1.0* 0.9*   No results found for this basename: LIPASE:5,AMYLASE:5 in the last 168 hours No results found for this basename: AMMONIA:5 in the last 168 hours CBC:  Lab 08/03/12 0355 08/02/12 0800 08/01/12 0513 07/31/12 0420 07/30/12 0757  WBC 5.8 8.4 5.4 5.5 7.2  NEUTROABS -- -- -- -- --  HGB 7.2* 8.2* 7.2* 7.5* 8.0*  HCT 23.6* 26.2* 22.7* 23.8* 24.8*  MCV 89.7 88.2 87.6 85.6 84.4  PLT 228 281 225 268 259   Cardiac Enzymes: No results found for this basename: CKTOTAL:5,CKMB:5,CKMBINDEX:5,TROPONINI:5 in the last 168 hours BNP (last 3 results) No results found for this basename: PROBNP:3 in the last 8760 hours CBG:  Lab 08/01/12 1228 08/01/12 1152 08/01/12 1102 08/01/12 0958  GLUCAP 125* 143* 76 56*    Recent Results (from the past 240 hour(s))  CULTURE, BLOOD (ROUTINE X 2)     Status: Normal   Collection Time   07/25/12  4:10 PM      Component Value Range Status Comment   Specimen Description BLOOD LEFT ARM   Final    Special Requests BOTTLES  DRAWN AEROBIC ONLY 3CC   Final    Culture  Setup Time 07/25/2012 22:13   Final    Culture     Final    Value: STAPHYLOCOCCUS AUREUS     Note: RIFAMPIN AND GENTAMICIN SHOULD NOT BE USED AS SINGLE DRUGS FOR TREATMENT OF STAPH INFECTIONS.     Note: Gram Stain Report Called to,Read Back By and Verified With: STEPHANIE DILLON 07/26/12 0830 BY SMITHERSJ   Report Status 07/28/2012 FINAL   Final    Organism ID, Bacteria STAPHYLOCOCCUS AUREUS   Final   CULTURE, BLOOD (ROUTINE X 2)     Status: Normal   Collection Time   07/25/12  6:00 PM      Component Value Range Status Comment   Specimen Description BLOOD LEFT ARM   Final    Special Requests BOTTLES DRAWN AEROBIC ONLY 3CC   Final    Culture  Setup Time 07/25/2012 22:13   Final    Culture NO GROWTH 5 DAYS   Final    Report Status 07/31/2012 FINAL   Final     URINE CULTURE     Status: Normal   Collection Time   07/25/12  6:50 PM      Component Value Range Status Comment   Specimen Description URINE, RANDOM   Final    Special Requests NONE   Final    Culture  Setup Time 07/26/2012 01:47   Final    Colony Count >=100,000 COLONIES/ML   Final    Culture     Final    Value: ESCHERICHIA COLI     Note: Confirmed Extended Spectrum Beta-Lactamase Producer (ESBL) CRITICAL RESULT CALLED TO, READ BACK BY AND VERIFIED WITH: VANCE JOHNSON @ 0025 ON 07/29/2012 HAJAM   Report Status 07/29/2012 FINAL   Final    Organism ID, Bacteria ESCHERICHIA COLI   Final   WOUND CULTURE     Status: Normal   Collection Time   07/25/12  8:10 PM      Component Value Range Status Comment   Specimen Description SACRAL   Final    Special Requests Normal   Final    Gram Stain     Final    Value: MODERATE WBC PRESENT,BOTH PMN AND MONONUCLEAR     NO SQUAMOUS EPITHELIAL CELLS SEEN     RARE GRAM NEGATIVE RODS     RARE GRAM POSITIVE RODS     RARE GRAM POSITIVE COCCI IN PAIRS   Culture     Final    Value: FEW STAPHYLOCOCCUS AUREUS     Note: RIFAMPIN AND GENTAMICIN SHOULD NOT BE USED AS SINGLE DRUGS FOR TREATMENT OF STAPH INFECTIONS.   Report Status 07/28/2012 FINAL   Final    Organism ID, Bacteria STAPHYLOCOCCUS AUREUS   Final   MRSA PCR SCREENING     Status: Abnormal   Collection Time   07/25/12  8:13 PM      Component Value Range Status Comment   MRSA by PCR POSITIVE (*) NEGATIVE Final   CULTURE, BLOOD (ROUTINE X 2)     Status: Normal   Collection Time   07/27/12  3:47 PM      Component Value Range Status Comment   Specimen Description BLOOD LEFT ARM   Final    Special Requests BOTTLES DRAWN AEROBIC ONLY 5CC   Final    Culture  Setup Time 07/27/2012 22:30   Final    Culture NO GROWTH 5 DAYS   Final    Report Status  08/02/2012 FINAL   Final   CULTURE, BLOOD (ROUTINE X 2)     Status: Normal   Collection Time   07/27/12  4:00 PM      Component Value Range Status  Comment   Specimen Description BLOOD LEFT ARM   Final    Special Requests BOTTLES DRAWN AEROBIC ONLY 3CC   Final    Culture  Setup Time 07/27/2012 22:30   Final    Culture NO GROWTH 5 DAYS   Final    Report Status 08/02/2012 FINAL   Final   BODY FLUID CULTURE     Status: Normal (Preliminary result)   Collection Time   07/30/12 11:59 AM      Component Value Range Status Comment   Specimen Description SHOULDER JOINT   Final    Special Requests Normal   Final    Gram Stain     Final    Value: NO WBC SEEN     NO ORGANISMS SEEN   Culture NO GROWTH 3 DAYS   Final    Report Status PENDING   Incomplete      Studies: No results found.  Scheduled Meds:    . calcium carbonate (dosed in mg elemental calcium)  1,000 mg of elemental calcium Oral BID   And  . cholecalciferol  400 Units Oral BID  . enoxaparin (LOVENOX) injection  30 mg Subcutaneous Q24H  . feeding supplement  1 Container Oral BID BM  . ferrous gluconate  324 mg Oral BID  . gabapentin  900 mg Oral TID  . HYDROmorphone PCA 0.3 mg/mL   Intravenous Q4H  . magnesium oxide  400 mg Oral BID  . magnesium sulfate 1 - 4 g bolus IVPB  3 g Intravenous Once  . magnesium sulfate 1 - 4 g bolus IVPB  2 g Intravenous Once  . meropenem (MERREM) IV  1 g Intravenous Q8H  . multivitamin with minerals  1 tablet Oral Daily  . nystatin  5 mL Oral QID  . oxybutynin  5 mg Oral TID  . phosphorus  500 mg Oral TID  . protein supplement  8 oz Oral BID  . simvastatin  40 mg Oral QHS  . sodium chloride  10-40 mL Intracatheter Q12H  . vitamin C  500 mg Oral BID  . vitamin E  400 Units Oral Daily  . zinc sulfate  220 mg Oral Daily  . DISCONTD: calcium-vitamin D  2 tablet Oral BID  . DISCONTD: feeding supplement  237 mL Oral BID BM  . DISCONTD: methadone  5 mg Oral BID   Continuous Infusions:    . DISCONTD: sodium chloride 50 mL/hr at 08/02/12 1106  . DISCONTD: 0.9 % NaCl with KCl 40 mEq / L 50 mL/hr at 08/01/12 1400    Principal Problem:   *SIRS (systemic inflammatory response syndrome) Active Problems:  Sacral decubitus ulcer, stage IV  Paraplegia following T10spinal cord injury  Anemia  Cardiomyopathy  Hyperkalemia  Acute renal failure  Sepsis  Inguinal hernia, left  Septic shock(785.52)  Staphylococcus aureus bacteremia with sepsis  UTI (lower urinary tract infection)  Osteomyelitis of pelvic region    Time spent: > 35 mins    [facility]  [facility] Pager [phone]. If 8PM-8AM, please contact night-coverage at [email or web address], password [facility] 08/03/2012, 10:12 AM  LOS: 9 days

## 2012-08-03 NOTE — Progress Notes (Addendum)
08/03/12 1300  Assessment   hydrotherapy deferred due to awaiting surgical clearance and change of post op dsg

## 2012-08-03 NOTE — Consult Note (Signed)
WOC ostomy consult  Stoma type/location: LLQ Colostomy Stomal assessment/size: 2 and 1/4 inches round, budded, pink, edematous stoma. Peristomal assessment: intact Treatment options for stomal/peristomal skin: none indicated Output none. Ostomy pouching: 2pc., 2 and 3/4 inch pouching system with a barrier ring (for parastomal skin protection in the event of reduction in stoma size due to resolving edema). Education provided: Patient instructed in GI A&P, including stoma characteristics.  Pouch characteristics reviewed.  Pouching change demonstrated, including system removal, skin cleansing, skin barrier preparation and application. Patient observed Lock and Roll closure and provided return demonstration of pouch closure x2.  Used his PCA pump x1 during encounter.  Asking appropriate questions. Outlook hopeful, positive today.  Is allowing the nursing staff to turn and reposition him every 2 hours. Next pouching system change is due on Monday. Thanks, Ladona Mow, MSN, RN, [facility], CWOCN [phone])

## 2012-08-03 NOTE — Clinical Social Work Psychosocial (Signed)
Clinical Social Work Department BRIEF PSYCHOSOCIAL ASSESSMENT 08/03/2012  Patient:  [name] [name], [name] [name]     Account Number:  [id number]     Admit date:  07/25/2012  Clinical Social Worker:  Jodelle Red  Date/Time:  08/03/2012 12:50 PM  Referred by:  CSW  Date Referred:  08/02/2012 Referred for  SNF Placement   Other Referral:   Interview type:  Patient Other interview type:   discussion with RN CM, chart review    PSYCHOSOCIAL DATA Living Status:  FAMILY Admitted from facility:   Level of care:   Primary support name:  Lossie Faes Primary support relationship to patient:  PARENT Degree of support available:   good from his mother    CURRENT CONCERNS Current Concerns  Adjustment to Illness  Post-Acute Placement   Other Concerns:    SOCIAL WORK ASSESSMENT / PLAN Pt requesting plan for care at d/c. CSW met with Pt at length to discuss needs. Pt recently released from [facility] and has been home with his mother for the last few weeks. He reports he was to get surgery while in prison, but was released. Pt has been looking at nursing care for at home, but due to his needs care at SNF would be better. He agrees to this plan and appears to understand that he needs to be proactive and involved in his care.   Assessment/plan status:  Psychosocial Support/Ongoing Assessment of Needs Other assessment/ plan:   SNF search  follow up on medicaid app   Information/referral to community resources:    PATIENT'S/FAMILY'S RESPONSE TO PLAN OF CARE: Pt coping well and adjusting to hospital environment. Pt has tried to be proactive in the community and already applied for mcd. CSW will begin search and follow. Pt appreciative.    Doreen Salvage, LCSW ICU/Stepdown Clinical Social Worker [facility] Cell [phone] Hours 8am-1200pm M-F

## 2012-08-03 NOTE — Progress Notes (Addendum)
[facility]    Subjective: Afebrile. Agreed to do TEE but would like to make sure that anesthesia won't have too much side effects   Antibiotics: Day #10 total antibiotics Day #7 meropenem  Medications:    . calcium carbonate (dosed in mg elemental calcium)  1,000 mg of elemental calcium Oral BID   And  . cholecalciferol  400 Units Oral BID  . enoxaparin (LOVENOX) injection  30 mg Subcutaneous Q24H  . feeding supplement  1 Container Oral BID BM  . ferrous gluconate  324 mg Oral BID  . gabapentin  900 mg Oral TID  . HYDROmorphone PCA 0.3 mg/mL   Intravenous Q4H  . magnesium oxide  400 mg Oral BID  . magnesium sulfate 1 - 4 g bolus IVPB  3 g Intravenous Once  . magnesium sulfate 1 - 4 g bolus IVPB  2 g Intravenous Once  . meropenem (MERREM) IV  1 g Intravenous Q8H  . methadone  10 mg Oral Daily  . multivitamin with minerals  1 tablet Oral Daily  . nystatin  5 mL Oral QID  . oxybutynin  5 mg Oral TID  . phosphorus  500 mg Oral TID  . protein supplement  8 oz Oral BID  . simvastatin  40 mg Oral QHS  . sodium chloride  10-40 mL Intracatheter Q12H  . vitamin C  500 mg Oral BID  . vitamin E  400 Units Oral Daily  . zinc sulfate  220 mg Oral Daily  . DISCONTD: calcium-vitamin D  2 tablet Oral BID  . DISCONTD: feeding supplement  237 mL Oral BID BM    Objective: Weight change:   Intake/Output Summary (Last 24 hours) at 08/03/12 1255 Last data filed at 08/03/12 1221  Gross per 24 hour  Intake   1442 ml  Output   2412 ml  Net   -970 ml   Blood pressure 103/64, pulse 93, temperature 97.7 F (36.5 C), temperature source Oral, resp. rate 14, height 6' 2.5" (1.892 m), weight 198 lb 10.2 oz (90.1 kg), SpO2 100.00%. Temp:  [97.3 F (36.3 C)-97.7 F (36.5 C)] 97.7 F (36.5 C) (10/24 0800) Pulse Rate:  [61-93] 93  (10/24 1000) Resp:  [10-18] 14  (10/24 1212) BP: (102-114)/(54-64) 103/64 mmHg (10/24 0800) SpO2:  [97 %-100 %] 100 % (10/24  1212) Weight:  [198 lb 10.2 oz (90.1 kg)] 198 lb 10.2 oz (90.1 kg) (10/24 0000)  Physical Exam: General: Alert and awake, oriented x3, not in any acute distress. HEENT: anicteric sclera, pupils reactive to light and accommodation, EOMI CVS regular rate, normal r,  no murmur rubs or gallops, device pocket site is CDI not warm Chest: clear to auscultation bilaterally, no wheezing, rales or rhonchi Abdomen: soft, ostomy in LLQ, midline incision covered, serous drainage noted on bandage Ext = HE has no sensation below waist.    Lab Results:  Basename 08/03/12 0355 08/02/12 0800  WBC 5.8 8.4  HGB 7.2* 8.2*  HCT 23.6* 26.2*  PLT 228 281    BMET  Basename 08/03/12 0355 08/02/12 0800  NA 135 133*  K 4.6 4.7  CL 106 104  CO2 23 23  GLUCOSE 97 92  BUN 7 5*  CREATININE 0.35* 0.41*  CALCIUM 7.8* 8.2*    Micro Results: 10/15 blood cx 1/2 MSSA(S to oxa, clinda, erythro, rifampin, bactrim, gent, levo, vanco 1) 10/15 wound cx few MSSA but g/s shows gnr, gpc, gpr 10/15 urine cx:  ESBL Ecoli 10/17 blood cx: NGTD 10/20 left hip cx (labelled as shoulder, accidentally): NGTD Studies/Results: No results found.    Assessment/Plan: Deronte Rindlisbacher is a 36 y.o. male with  MSSA bacteremia WITH AICD, chronic decubitus ulcer, now newly diagnosed hip effusion and ESBL in urine  1)  methicillin sensitive staph aureus bacteremia: despite only 1 blood culture positive for MSSA, I am concerned about his device being seeded 2/2 bacteremia. - Recommend to get TEE , and will need anesthesia for proper sedation for TEE.  - Will switch to cefazolin 2gm IV Q8 hr for 5 additional weeks to complete 6 wk course.  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 Diverting colostomy should help healing process. Will switch to cefazolin 2gm IV Q 8hr plus oral  metronidazole 500mg  TID.   Would recommend to ask plastics recommendation for management of wound  3)  extended spectrum beta-lactamase Ecoli in the urine: 100,000 CFU found in urine culture, but we also have explanation of his sepsis with his MSSA bacteremia. 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

## 2012-08-04 LAB — BASIC METABOLIC PANEL
BUN: 5 mg/dL — ABNORMAL LOW (ref 6–23)
Calcium: 8.1 mg/dL — ABNORMAL LOW (ref 8.4–10.5)
Chloride: 102 mEq/L (ref 96–112)
Creatinine, Ser: 0.33 mg/dL — ABNORMAL LOW (ref 0.50–1.35)
GFR calc Af Amer: >90 mL/min (ref >90)
GFR calc non Af Amer: >90 mL/min (ref >90)

## 2012-08-04 LAB — CBC
MCHC: 31.5 g/dL (ref 30.0–36.0)
Platelets: 246 10*3/uL (ref 150–400)
RDW: 18 % — ABNORMAL HIGH (ref 11.5–15.5)
WBC: 5.1 10*3/uL (ref 4.0–10.5)

## 2012-08-04 LAB — MAGNESIUM: Magnesium: 1.8 mg/dL (ref 1.5–2.5)

## 2012-08-04 MED ORDER — OXYCODONE HCL 5 MG PO TABS
5.0000 mg | ORAL_TABLET | ORAL | Status: DC | PRN
  Administered 2012-08-04 – 2012-08-11 (×15): 10 mg via ORAL
  Filled 2012-08-04 (×16): qty 2

## 2012-08-04 MED ORDER — MORPHINE SULFATE 2 MG/ML IJ SOLN
2.0000 mg | INTRAMUSCULAR | Status: DC | PRN
  Administered 2012-08-04 – 2012-08-06 (×5): 4 mg via INTRAVENOUS
  Administered 2012-08-07: 2 mg via INTRAVENOUS
  Administered 2012-08-07: 4 mg via INTRAVENOUS
  Administered 2012-08-07: 2 mg via INTRAVENOUS
  Administered 2012-08-07 – 2012-08-08 (×4): 4 mg via INTRAVENOUS
  Administered 2012-08-09 (×2): 2 mg via INTRAVENOUS
  Administered 2012-08-09 – 2012-08-13 (×17): 4 mg via INTRAVENOUS
  Filled 2012-08-04 (×8): qty 2
  Filled 2012-08-04: qty 1
  Filled 2012-08-04 (×6): qty 2
  Filled 2012-08-04: qty 1
  Filled 2012-08-04 (×2): qty 2
  Filled 2012-08-04 (×2): qty 1
  Filled 2012-08-04 (×12): qty 2

## 2012-08-04 MED ORDER — SODIUM CHLORIDE 0.9 % IV SOLN
INTRAVENOUS | Status: DC
  Administered 2012-08-04 (×2): via INTRAVENOUS

## 2012-08-04 NOTE — Progress Notes (Signed)
[facility]    Subjective: Has had some back pain due to PCA on hold. Awaiting to get TEE  Antibiotics: Day #11 total antibiotics Day #2 cefazolin Day #2 metro po Finished 7 d of meropenem on 10/23  Medications:    . calcium carbonate (dosed in mg elemental calcium)  1,000 mg of elemental calcium Oral BID   And  . cholecalciferol  400 Units Oral BID  .  ceFAZolin (ANCEF) IV  2 g Intravenous Q8H  . enoxaparin (LOVENOX) injection  40 mg Subcutaneous Q24H  . feeding supplement  1 Container Oral BID BM  . ferrous gluconate  324 mg Oral BID  . gabapentin  900 mg Oral TID  . magnesium oxide  400 mg Oral BID  . methadone  10 mg Oral Daily  . metroNIDAZOLE  500 mg Oral Q8H  . multivitamin with minerals  1 tablet Oral Daily  . nystatin  5 mL Oral QID  . oxybutynin  5 mg Oral TID  . phosphorus  500 mg Oral TID  . protein supplement  8 oz Oral BID  . simvastatin  40 mg Oral QHS  . sodium chloride  10-40 mL Intracatheter Q12H  . vitamin C  500 mg Oral BID  . vitamin E  400 Units Oral Daily  . zinc sulfate  220 mg Oral Daily  . DISCONTD: HYDROmorphone PCA 0.3 mg/mL   Intravenous Q4H    Objective: Weight change:   Intake/Output Summary (Last 24 hours) at 08/04/12 1628 Last data filed at 08/04/12 0507  Gross per 24 hour  Intake    140 ml  Output    200 ml  Net    -60 ml   Blood pressure 102/50, pulse 77, temperature 98.1 F (36.7 C), temperature source Oral, resp. rate 16, height 6' 2.5" (1.892 m), weight 198 lb 10.2 oz (90.1 kg), SpO2 99.00%. Temp:  [97.5 F (36.4 C)-98.1 F (36.7 C)] 98.1 F (36.7 C) (10/25 1437) Pulse Rate:  [68-85] 77  (10/25 1437) Resp:  [13-20] 16  (10/25 1437) BP: (99-107)/(50-68) 102/50 mmHg (10/25 1437) SpO2:  [99 %-100 %] 99 % (10/25 1437)  Physical Exam: General: Alert and awake, oriented x3, not in any acute distress. HEENT: anicteric sclera, pupils reactive to light and accommodation, EOMI CVS regular  rate, normal r,  no murmur rubs or gallops, device pocket site is CDI not warm Chest: clear to auscultation bilaterally, no wheezing, rales or rhonchi Abdomen: soft, ostomy in LLQ, midline incision covered, serous drainage noted on bandage Ext = HE has no sensation below waist.  Large sacral decub -packed, but will need to see at wound change   Lab Results:  Basename 08/04/12 0406 08/03/12 0355  WBC 5.1 5.8  HGB 7.3* 7.2*  HCT 23.2* 23.6*  PLT 246 228    BMET  Basename 08/04/12 0406 08/03/12 0355  NA 131* 135  K 4.5 4.6  CL 102 106  CO2 26 23  GLUCOSE 86 97  BUN 5* 7  CREATININE 0.33* 0.35*  CALCIUM 8.1* 7.8*    Micro Results: 10/15 blood cx 1/2 MSSA(S to oxa, clinda, erythro, rifampin, bactrim, gent, levo, vanco 1) 10/15 wound cx few MSSA but g/s shows gnr, gpc, gpr 10/15 urine cx: ESBL Ecoli 10/17 blood cx: NGTD 10/20 left hip cx (labelled as shoulder, accidentally): NGTD   Assessment/Plan: Ikenna Ohms is a 36 y.o. male with  MSSA bacteremia WITH AICD, chronic decubitus ulcer, now newly diagnosed  hip effusion and ESBL in urine  1)  methicillin sensitive staph aureus bacteremia: despite only 1 blood culture positive for MSSA, I am concerned about his device being seeded 2/2 bacteremia. - awaiting for TEE to be performed today , and will need anesthesia for proper sedation for TEE. IF there is vegetations, will need to consult cardiology for device removal.  - for now he will need cefazolin 2gm IV Q8 hr for 5 additional weeks to complete 6 wk course. 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 Currently on cefazolin 2gm IV Q 8hr plus oral metronidazole 500mg  TID.   Would recommend to ask plastics recommendation for management of wound for dispo  3)  extended spectrum beta-lactamase Ecoli in the urine: 100,000 CFU found in urine culture, he was  treated for 7 days with meropenem, now resolved.  4) dispo = recommend that he goes to SNF or [facility] where he can get wound care management and IV antibiotics. Will arrange for follow up appt in ID clinic in 4-5 wks.   Trey Paula hatcher available this weekend for questions   LOS: 10 days   Judyann Munson 08/04/2012, 4:28 PM

## 2012-08-04 NOTE — Progress Notes (Signed)
Patient ID: [name] [name], male   DOB: [birth year], 36 y.o.   MRN: [id number] 3 Days Post-Op  Subjective: Pain today but PCA had to be d/c'd through the night, NPO right now for TEE, denies n/v.  Objective: Vital signs in last 24 hours: Temp:  [97.5 F (36.4 C)-97.6 F (36.4 C)] 97.6 F (36.4 C) (10/25 0504) Pulse Rate:  [68-96] 85  (10/25 0504) Resp:  [11-20] 18  (10/25 0504) BP: (99-108)/(53-68) 99/53 mmHg (10/25 0504) SpO2:  [100 %] 100 % (10/25 0504) Last BM Date: 07/31/12  Intake/Output from previous day: 10/24 0701 - 10/25 0700 In: 390 [I.V.:10; IV Piggyback:100] Out: 1300 [Urine:1300] Intake/Output this shift:    GI: soft, mild tenderness. ostomy pink, no output yet, gas in bag  Lab Results:   Basename 08/04/12 0406 08/03/12 0355  WBC 5.1 5.8  HGB 7.3* 7.2*  HCT 23.2* 23.6*  PLT 246 228   BMET  Basename 08/04/12 0406 08/03/12 0355  NA 131* 135  K 4.5 4.6  CL 102 106  CO2 26 23  GLUCOSE 86 97  BUN 5* 7  CREATININE 0.33* 0.35*  CALCIUM 8.1* 7.8*   PT/INR No results found for this basename: LABPROT:2,INR:2 in the last 72 hours ABG No results found for this basename: PHART:2,PCO2:2,PO2:2,HCO3:2 in the last 72 hours  Studies/Results: No results found.  Anti-infectives: Anti-infectives     Start     Dose/Rate Route Frequency Ordered Stop   08/03/12 2000   metroNIDAZOLE (FLAGYL) tablet 500 mg        500 mg Oral 3 times per day 08/03/12 1625     08/03/12 1800   ceFAZolin (ANCEF) IVPB 2 g/50 mL premix        2 g 100 mL/hr over 30 Minutes Intravenous 3 times per day 08/03/12 1625     08/01/12 1300   meropenem (MERREM) 1 g in sodium chloride 0.9 % 100 mL IVPB  Status:  Discontinued        1 g 200 mL/hr over 30 Minutes Intravenous To Surgery 08/01/12 1039 08/01/12 1246   07/29/12 0100   meropenem (MERREM) 1 g in sodium chloride 0.9 % 100 mL IVPB  Status:  Discontinued        1 g 200 mL/hr over 30 Minutes Intravenous 3 times per day 07/29/12 0050  08/03/12 1625   07/26/12 0500   vancomycin (VANCOCIN) 750 mg in sodium chloride 0.9 % 150 mL IVPB  Status:  Discontinued        750 mg 150 mL/hr over 60 Minutes Intravenous Every 12 hours 07/25/12 1958 07/28/12 1033   07/26/12 0200   piperacillin-tazobactam (ZOSYN) IVPB 3.375 g  Status:  Discontinued        3.375 g 12.5 mL/hr over 240 Minutes Intravenous Every 8 hours 07/25/12 1958 07/29/12 0043   07/25/12 1700   vancomycin (VANCOCIN) IVPB 1000 mg/200 mL premix        1,000 mg 200 mL/hr over 60 Minutes Intravenous  Once 07/25/12 1619 07/25/12 1807   07/25/12 1630   piperacillin-tazobactam (ZOSYN) IVPB 3.375 g  Status:  Discontinued        3.375 g 12.5 mL/hr over 240 Minutes Intravenous  Once 07/25/12 1619 07/25/12 1938          Assessment/Plan: 1. POD#3-open diverting colostomy, left ing hernia repair and sacral wound debridement: increased pain due to lack of pain meds, RN giving IV med now, npo for TEE, tolerating full liquids well  --would keep on fulls after  TEE due to no ostomy output yet  --OOB and ambulate  --continue dressing changes     LOS: 10 days    Silvestre Mines 08/04/2012

## 2012-08-04 NOTE — Progress Notes (Signed)
[facility] PROGRESS NOTE  [name] [name] ZOX:[id number] DOB: Oct 02, 1976 DOA: 07/25/2012 PCP: No primary provider on file.  Assessment/Plan: 1. Septic shock secondary to osteomyelitis and MSSA bacteremia/ESBL in the urine, infectious disease following, bacteremia likely secondary to decubitus ulcer, patient was on vancomycin which was discontinued because of no MRSA isolation. Repeat blood cultures drawn on 10/18 still pending and do not reveal anything as yet-appreciate cardiology input. Cardiac echo done on 10/16 did not show any vegetations-Tee scheduled 07/31/12 is unable to be performed-repeat scheduled. Currently finishing course of therapy for ESBL in the urine and potentially narrowing per infectious disease input [consult appreciated] by Dr. Ilsa Iha. Continue IV merrem. Patient hesitant to undergo TEE with anesthesia however has agreed to will not be done until next Tuesday. IV marrow has been changed to IV cefazolin. Flagyl is also been added to her regimen per ID recommendations. ID and CCS ff and appreciate input and rxcs. 2. 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 [name] Appreciate input into patient's care-Vitamin C and E, Zinc added to meds for wound healing-to aid healing. WOC nurse consulted 10/21. Dr Mahala Menghini spoke with Dr. Kelly Splinter 10/21 and patient will need to be seen by her in the interim. Patient will need outpatient followup at the wound care clinic on discharge. In the meanwhile, would recommend per general surgery's input a wound vacuum to keep the area clean and to promote healing. Wound care nurse already involved in patient's care. PCA morphine has been started by surgery and was discontinued yesterday secondary to decreased respiratory rate. IV marrow has been changed to IV cefazolin and oral Flagyl added to her regimen per ID recommendations. Toradol also started per  general surgery. General surgery is following and appreciate input and recommendations.  3. MSSA bacteremia-repeat blood cultures pending and negative to date. 2-D echo was negative for vegetations. TEE was unsuccessful under conscious sedation. Patient agreed to TEE under anesthesia per cardiology recommendations however cannot be done until next week Tuesday.IV merrem changed to IV cefazolin 2 gm q8 x 5 more weeks per ID rxcs. 4. UTI secondary to ESBL, switched to meropenem 10/19, merrem d/c'd yesterday. On cefazolin.  ID ff 5. Hypokalemia repleted-Bmet am 6. Acute kidney injury creatinine was 1.91, now improved to 0.33. NSL IVF.  7. Hypomagnesemia repleted. D/C KCL from IVF. BMET in AM. 8. Hypophosphatemia repleted-Rpt  9. Cardiomyopathy with an EF of 25-30%. Not a candidate for ACE inhibitor/beta blocker given low blood pressure/renal insufficiency-we'll obtain records from [place] where patient was incarcerated-patient states that he saw a cardiologist by the name of Dr. Elwyn Reach who recommended this for him.  10. Elevated alkaline phosphatase-probably secondary to bone remodeling.  11. Likely Anemia of Chronic disease/ Iron deficiency anemia-Anemia panel 10/15 showed this. Would not transfuse unless peri-operative per Gen surgeon. Transfusion threshold is below 7.0. May need a dose of IV iron. Follow H/H. 12. Poor nutritional status-Albumin on Cmet was 0.9. Prealbumin 5.3. Once tolerating PO will need nuitrition consult. 13. Surgical absence of R kidney-potentially may need Aranesp-would get dosing from either hematology or oncology or nephrology  14. T10 paraplegia-needs significant therapy with occupational and physical therapy and potentially might benefit from inpatient rehabilitation  15. Prophylaxis Lovenox for DVT prophylaxis      Code Status: Full Family Communication: Updated patient at bedside. No family present. Disposition Plan: SNF vs Home when medically  stable   Consultants: Gen surgery : Dr Michaell Cowing 07/25/12 Plastic Surgery:  Shawn Rayburn, PA 07/31/12 Infectious disease  Dr Drue Second 07/27/12 Cardiology PCCM : Dr Kendrick Fries 07/25/12  Procedures: Peripheral(refused CVL x 3)  Dopamine infusion discontinued 07/28/2012  Has PICC placed 10/20  CT abdomen and pelvis 07/28/2012 CT of the L-spine 07/25/2012 Chest x-ray 07/25/2012 2-D echo 07/26/2012 Arthrocentesis of left hip joint effusion 07/30/2012 (Diverting colostomy/left inguinal hernia repair/wound debridement per Dr. Carolynne Edouard and Donell Beers 08/01/2012   Cultures / Sepsis markers:  10/15 blood >>gpc>>1/2 sa res to pcn but oxacillin ss  10/15 urine >>ESBL  10/15 wound>>gpc>>SA>>pan ss  10/18 Blood >>neg till date  10/20 Hip aspirate cultures negative at present         Antibiotics: per ID 10/15 vanc (sacral ulcer) >>10/18  10/15 zosyn (sacral ulcer)(gnr urine) >> 10/18  10/18 Meropenem>>> 10/24 10/24 cefazolin   HPI/Subjective: Events overnight noted. PCA pump has been discontinued. Patient with some pain.  Objective: Filed Vitals:   08/04/12 0000 08/04/12 0051 08/04/12 0504 08/04/12 1437  BP:   99/53 102/50  Pulse:   85 77  Temp:   97.6 F (36.4 C) 98.1 F (36.7 C)  TempSrc:   Oral Oral  Resp: 18 13 18 16   Height:      Weight:      SpO2: 100% 100% 100% 99%    Intake/Output Summary (Last 24 hours) at 08/04/12 1525 Last data filed at 08/04/12 0507  Gross per 24 hour  Intake    160 ml  Output    350 ml  Net   -190 ml   Filed Weights   07/25/12 1930 08/01/12 0523 08/03/12 0000  Weight: 79.6 kg (175 lb 7.8 oz) 94.6 kg (208 lb 8.9 oz) 90.1 kg (198 lb 10.2 oz)    Exam:   General:  Sitting up in bed in no NAD  Cardiovascular: RRR  Respiratory: CTAB. No LE edema  Abdomen: Soft/NT/ND/+BS/OSTOMY PINK, NO OUTPUT  Data Reviewed: Basic Metabolic Panel:  Lab 08/04/12 5621 08/03/12 0355 08/02/12 0800 08/01/12 0513 07/31/12 0420 07/29/12 0110  NA 131* 135 133*  135 137 --  K 4.5 4.6 4.7 4.5 4.0 --  CL 102 106 104 106 108 --  CO2 26 23 23 23 23  --  GLUCOSE 86 97 92 79 90 --  BUN 5* 7 5* 5* 5* --  CREATININE 0.33* 0.35* 0.41* 0.45* 0.45* --  CALCIUM 8.1* 7.8* 8.2* 7.6* 7.6* --  MG 1.8 1.8 1.4* 1.4* -- 1.4*  PHOS -- -- -- 3.0 -- 2.3   Liver Function Tests:  Lab 08/01/12 0513 07/31/12 0420  AST 17 20  ALT 10 10  ALKPHOS 100 106  BILITOT 0.4 0.5  PROT 5.8* 5.8*  ALBUMIN 1.0* 0.9*   No results found for this basename: LIPASE:5,AMYLASE:5 in the last 168 hours No results found for this basename: AMMONIA:5 in the last 168 hours CBC:  Lab 08/04/12 0406 08/03/12 0355 08/02/12 0800 08/01/12 0513 07/31/12 0420  WBC 5.1 5.8 8.4 5.4 5.5  NEUTROABS -- -- -- -- --  HGB 7.3* 7.2* 8.2* 7.2* 7.5*  HCT 23.2* 23.6* 26.2* 22.7* 23.8*  MCV 89.2 89.7 88.2 87.6 85.6  PLT 246 228 281 225 268   Cardiac Enzymes: No results found for this basename: CKTOTAL:5,CKMB:5,CKMBINDEX:5,TROPONINI:5 in the last 168 hours BNP (last 3 results) No results found for this basename: PROBNP:3 in the last 8760 hours CBG:  Lab 08/01/12 1228 08/01/12 1152 08/01/12 1102 08/01/12 0958  GLUCAP 125* 143* 76 56*    Recent Results (from the past 240 hour(s))  CULTURE, BLOOD (ROUTINE X 2)     Status: Normal   Collection Time   07/25/12  4:10 PM      Component Value Range Status Comment   Specimen Description BLOOD LEFT ARM   Final    Special Requests BOTTLES DRAWN AEROBIC ONLY 3CC   Final    Culture  Setup Time 07/25/2012 22:13   Final    Culture     Final    Value: STAPHYLOCOCCUS AUREUS     Note: RIFAMPIN AND GENTAMICIN SHOULD NOT BE USED AS SINGLE DRUGS FOR TREATMENT OF STAPH INFECTIONS.     Note: Gram Stain Report Called to,Read Back By and Verified With: STEPHANIE DILLON 07/26/12 0830 BY SMITHERSJ   Report Status 07/28/2012 FINAL   Final    Organism ID, Bacteria STAPHYLOCOCCUS AUREUS   Final   CULTURE, BLOOD (ROUTINE X 2)     Status: Normal   Collection Time    07/25/12  6:00 PM      Component Value Range Status Comment   Specimen Description BLOOD LEFT ARM   Final    Special Requests BOTTLES DRAWN AEROBIC ONLY 3CC   Final    Culture  Setup Time 07/25/2012 22:13   Final    Culture NO GROWTH 5 DAYS   Final    Report Status 07/31/2012 FINAL   Final   URINE CULTURE     Status: Normal   Collection Time   07/25/12  6:50 PM      Component Value Range Status Comment   Specimen Description URINE, RANDOM   Final    Special Requests NONE   Final    Culture  Setup Time 07/26/2012 01:47   Final    Colony Count >=100,000 COLONIES/ML   Final    Culture     Final    Value: ESCHERICHIA COLI     Note: Confirmed Extended Spectrum Beta-Lactamase Producer (ESBL) CRITICAL RESULT CALLED TO, READ BACK BY AND VERIFIED WITH: VANCE JOHNSON @ 0025 ON 07/29/2012 HAJAM   Report Status 07/29/2012 FINAL   Final    Organism ID, Bacteria ESCHERICHIA COLI   Final   WOUND CULTURE     Status: Normal   Collection Time   07/25/12  8:10 PM      Component Value Range Status Comment   Specimen Description SACRAL   Final    Special Requests Normal   Final    Gram Stain     Final    Value: MODERATE WBC PRESENT,BOTH PMN AND MONONUCLEAR     NO SQUAMOUS EPITHELIAL CELLS SEEN     RARE GRAM NEGATIVE RODS     RARE GRAM POSITIVE RODS     RARE GRAM POSITIVE COCCI IN PAIRS   Culture     Final    Value: FEW STAPHYLOCOCCUS AUREUS     Note: RIFAMPIN AND GENTAMICIN SHOULD NOT BE USED AS SINGLE DRUGS FOR TREATMENT OF STAPH INFECTIONS.   Report Status 07/28/2012 FINAL   Final    Organism ID, Bacteria STAPHYLOCOCCUS AUREUS   Final   MRSA PCR SCREENING     Status: Abnormal   Collection Time   07/25/12  8:13 PM      Component Value Range Status Comment   MRSA by PCR POSITIVE (*) NEGATIVE Final   CULTURE, BLOOD (ROUTINE X 2)     Status: Normal   Collection Time   07/27/12  3:47 PM      Component Value Range Status Comment   Specimen Description BLOOD  LEFT ARM   Final    Special Requests  BOTTLES DRAWN AEROBIC ONLY 5CC   Final    Culture  Setup Time 07/27/2012 22:30   Final    Culture NO GROWTH 5 DAYS   Final    Report Status 08/02/2012 FINAL   Final   CULTURE, BLOOD (ROUTINE X 2)     Status: Normal   Collection Time   07/27/12  4:00 PM      Component Value Range Status Comment   Specimen Description BLOOD LEFT ARM   Final    Special Requests BOTTLES DRAWN AEROBIC ONLY 3CC   Final    Culture  Setup Time 07/27/2012 22:30   Final    Culture NO GROWTH 5 DAYS   Final    Report Status 08/02/2012 FINAL   Final   BODY FLUID CULTURE     Status: Normal (Preliminary result)   Collection Time   07/30/12 11:59 AM      Component Value Range Status Comment   Specimen Description SHOULDER JOINT   Final    Special Requests Normal   Final    Gram Stain     Final    Value: NO WBC SEEN     NO ORGANISMS SEEN   Culture NO GROWTH 3 DAYS   Final    Report Status PENDING   Incomplete      Studies: No results found.  Scheduled Meds:    . calcium carbonate (dosed in mg elemental calcium)  1,000 mg of elemental calcium Oral BID   And  . cholecalciferol  400 Units Oral BID  .  ceFAZolin (ANCEF) IV  2 g Intravenous Q8H  . enoxaparin (LOVENOX) injection  40 mg Subcutaneous Q24H  . feeding supplement  1 Container Oral BID BM  . ferrous gluconate  324 mg Oral BID  . gabapentin  900 mg Oral TID  . magnesium oxide  400 mg Oral BID  . methadone  10 mg Oral Daily  . metroNIDAZOLE  500 mg Oral Q8H  . multivitamin with minerals  1 tablet Oral Daily  . nystatin  5 mL Oral QID  . oxybutynin  5 mg Oral TID  . phosphorus  500 mg Oral TID  . protein supplement  8 oz Oral BID  . simvastatin  40 mg Oral QHS  . sodium chloride  10-40 mL Intracatheter Q12H  . vitamin C  500 mg Oral BID  . vitamin E  400 Units Oral Daily  . zinc sulfate  220 mg Oral Daily  . DISCONTD: enoxaparin (LOVENOX) injection  30 mg Subcutaneous Q24H  . DISCONTD: HYDROmorphone PCA 0.3 mg/mL   Intravenous Q4H  . DISCONTD:  meropenem (MERREM) IV  1 g Intravenous Q8H   Continuous Infusions:    . sodium chloride 75 mL/hr at 08/04/12 1134    Principal Problem:  *SIRS (systemic inflammatory response syndrome) Active Problems:  Sacral decubitus ulcer, stage IV  Paraplegia following T10spinal cord injury  Anemia  Cardiomyopathy  Hyperkalemia  Acute renal failure  Sepsis  Inguinal hernia, left  Septic shock(785.52)  Staphylococcus aureus bacteremia with sepsis  UTI (lower urinary tract infection)  Osteomyelitis of pelvic region    Time spent: > 35 mins    [facility]  [facility] Pager [phone]. If 8PM-8AM, please contact night-coverage at [email or web address], password [facility] 08/04/2012, 3:25 PM  LOS: 10 days

## 2012-08-04 NOTE — Clinical Social Work Note (Signed)
CSW following for dispo needs. Pt will need SNF or LTAC. 5E CSW picking up case from ICU CSW. CSW to follow and assist.   Doreen Salvage, LCSW ICU/Stepdown Clinical Social Worker [facility] Cell [phone] Hours 8am-1200pm M-F

## 2012-08-04 NOTE — Progress Notes (Signed)
PCA pump discontinued as patient's respirations dropped to six and blood pressure lowered on night shift.  Dr. Janee Morn ordered new pain medications for patient.  Will continue to monitor.  Dr. Doristine Mango aware.

## 2012-08-04 NOTE — Consult Note (Signed)
WOC consult Note Reason for Consult:Patient transferred from ICU where his bed was a low air loss therapeutic sleep surface.  I will implement a low air loss therapeutic overlay for his use here on the med-surg unit. I will see Monday for ostomy pouch change. Thanks, Ladona Mow, MSN, RN, [facility], CWOCN [phone])

## 2012-08-04 NOTE — Progress Notes (Signed)
Mattress overlay ordered  For patient's bed.

## 2012-08-04 NOTE — Progress Notes (Signed)
PT/Hydrotherapy Note:  Got information from nurse around 3:00pm that [name] [name] , [place] stated for [name] to continued with dressing changes and hydrotherapy starting back today.  We checked on pt and pt stated that he was worried about the pain he was having in his stomach (around ostomy) and would like to wait until tomorrow morning if he could. I stressed that it would be good if we started back today, and I would let the nurse check on pain meds and see if he felt up to it in about 30 minutes.  After returning after pt received pain meds, he still deferred until tomorrow morning.  We have set a time to see him around 10:30 on Saturday for pulsed lavage hydrotherapy.   Marella Bile, PT Pager: [phone] 08/04/2012

## 2012-08-04 NOTE — Progress Notes (Signed)
PT/Hydro therapy Note:  Holding pulsed lavage for hydrotherapy until further re-order or clarification to continued pulsed lavage from surgeon due to new surgical debridement on 08/02/2012.    Please specific to continue pulsed lavage if needed. Will await clarification.   Marella Bile, PT Pager: [phone] 08/04/2012

## 2012-08-04 NOTE — Progress Notes (Signed)
As per nursing report today, patient for TEE at [facility].  RN called [facility] lab who informed RN that patient was not scheduled there today for TEE.  Spoke to [employer] with cardiology who said she was waiting for a phone call from anestesia for a time for the procedure.  As per Rosann Auerbach, unable to schedule patient and TEE will have to get done some time next week.  Dr. Janee Morn made aware.

## 2012-08-05 LAB — CBC
HCT: 22.6 % — ABNORMAL LOW (ref 39.0–52.0)
Hemoglobin: 7.1 g/dL — ABNORMAL LOW (ref 13.0–17.0)
MCHC: 31.4 g/dL (ref 30.0–36.0)
WBC: 4.6 10*3/uL (ref 4.0–10.5)

## 2012-08-05 LAB — BASIC METABOLIC PANEL
BUN: 4 mg/dL — ABNORMAL LOW (ref 6–23)
Chloride: 103 mEq/L (ref 96–112)
GFR calc Af Amer: >90 mL/min (ref >90)
GFR calc non Af Amer: >90 mL/min (ref >90)
Potassium: 4.1 mEq/L (ref 3.5–5.1)
Sodium: 135 mEq/L (ref 135–145)

## 2012-08-05 LAB — MAGNESIUM: Magnesium: 1.5 mg/dL (ref 1.5–2.5)

## 2012-08-05 MED ORDER — MAGNESIUM SULFATE 50 % IJ SOLN
3.0000 g | Freq: Once | INTRAVENOUS | Status: AC
  Administered 2012-08-05: 3 g via INTRAVENOUS
  Filled 2012-08-05: qty 6
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
## 2012-08-05 NOTE — Progress Notes (Signed)
[facility] PROGRESS NOTE  [name] [name] ZOX:[id number] DOB: 09-18-1976 DOA: 07/25/2012 PCP: No primary provider on file.  Assessment/Plan: 1. Septic shock secondary to osteomyelitis and MSSA bacteremia/ESBL in the urine, infectious disease following, bacteremia likely secondary to decubitus ulcer, patient was on vancomycin which was discontinued because of no MRSA isolation. Repeat blood cultures drawn on 10/18 still pending and do not reveal anything as yet-appreciate cardiology input. Cardiac echo done on 10/16 did not show any vegetations-Tee scheduled 07/31/12 is unable to be performed-repeat scheduled. Currently finishing course of therapy for ESBL in the urine and potentially narrowing per infectious disease input [consult appreciated] by Dr. Ilsa Iha. Patient hesitant to undergo TEE with anesthesia however has agreed to, and will not be done until next Tuesday. IV merrem has been changed to IV cefazolin. Flagyl is also been added to her regimen per ID recommendations. ID and CCS ff and appreciate input and rxcs. 2. 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 [name] Appreciate input into patient's care-Vitamin C and E, Zinc added to meds for wound healing-to aid healing. WOC nurse consulted 10/21. Dr Mahala Menghini spoke with Dr. Kelly Splinter 10/21 and patient will need to be seen by her in the interim. Patient will need outpatient followup at the wound care clinic on discharge. In the meanwhile, would recommend per general surgery's input a wound vacuum to keep the area clean and to promote healing. Wound care nurse already involved in patient's care. PCA morphine has been started by surgery and was discontinued secondary to decreased respiratory rate. IV merrem has been changed to IV cefazolin and oral Flagyl added to her regimen per ID recommendations. Toradol also started per general surgery. Oxycodone  PRN.  Continue hydrotherapy. General surgery is following and appreciate input and recommendations.  3. MSSA bacteremia-repeat blood cultures pending and negative to date. 2-D echo was negative for vegetations. TEE was unsuccessful under conscious sedation. Patient agreed to TEE under anesthesia per cardiology recommendations however cannot be done until next week Tuesday.IV merrem changed to IV cefazolin 2 gm q8 x 5 more weeks per ID rxcs. 4. UTI secondary to ESBL, switched to meropenem 10/19, merrem d/c'd. On cefazolin.  ID ff 5. Hypokalemia repleted-Bmet am 6. Acute kidney injury creatinine was 1.91, now improved to 0.31. NSL IVF.  7. Hypomagnesemia repleted. D/C KCL from IVF. BMET in AM. 8. Hypophosphatemia repleted-Rpt  9. Cardiomyopathy with an EF of 25-30%. Not a candidate for ACE inhibitor/beta blocker given low blood pressure/renal insufficiency-we'll obtain records from [place] where patient was incarcerated-patient states that he saw a cardiologist by the name of Dr. Elwyn Reach who recommended this for him.  10. Elevated alkaline phosphatase-probably secondary to bone remodeling.  11. Likely Anemia of Chronic disease/ Iron deficiency anemia-Anemia panel 10/15 showed this. Would not transfuse unless peri-operative per Gen surgeon. Transfusion threshold is below 7.0. May need a dose of IV iron. Follow H/H. 12. Poor nutritional status-Albumin on Cmet was 0.9. Prealbumin 5.3. Once tolerating PO will need nuitrition consult. 13. Surgical absence of R kidney-potentially may need Aranesp-would get dosing from either hematology or oncology or nephrology  14. T10 paraplegia-needs significant therapy with occupational and physical therapy and potentially might benefit from inpatient rehabilitation  15. Prophylaxis Lovenox for DVT prophylaxis      Code Status: Full Family Communication: Updated patient at bedside. No family present. Disposition Plan: SNF   Consultants: Gen surgery : Dr Michaell Cowing  07/25/12 Plastic Surgery:  Lazaro Arms, PA  07/31/12 Infectious disease  Dr Drue Second 07/27/12 Cardiology: Dr Tenny Craw PCCM : Dr Kendrick Fries 07/25/12  Procedures: Peripheral(refused CVL x 3)  Dopamine infusion discontinued 07/28/2012  Has PICC placed 10/20  CT abdomen and pelvis 07/28/2012 CT of the L-spine 07/25/2012 Chest x-ray 07/25/2012 2-D echo 07/26/2012 Arthrocentesis of left hip joint effusion 07/30/2012 (Diverting colostomy/left inguinal hernia repair/wound debridement per Dr. Carolynne Edouard and Donell Beers 08/01/2012   Cultures / Sepsis markers:  10/15 blood >>gpc>>1/2 sa res to pcn but oxacillin ss  10/15 urine >>ESBL  10/15 wound>>gpc>>SA>>pan ss  10/18 Blood >>neg till date  10/20 Hip aspirate cultures negative at present         Antibiotics: per ID 10/15 vanc (sacral ulcer) >>10/18  10/15 zosyn (sacral ulcer)(gnr urine) >> 10/18  10/18 Meropenem>>> 10/24 10/24 cefazolin   HPI/Subjective: Patient states pain is being controlled. Patient wants to eat.  Objective: Filed Vitals:   08/04/12 1437 08/04/12 2300 08/05/12 0700 08/05/12 1418  BP: 102/50 110/66 157/70 111/71  Pulse: 77 89 83 98  Temp: 98.1 F (36.7 C) 97.7 F (36.5 C) 98.2 F (36.8 C) 98.2 F (36.8 C)  TempSrc: Oral Oral Oral Oral  Resp: 16 18 18 18   Height:      Weight:      SpO2: 99% 99% 96% 98%    Intake/Output Summary (Last 24 hours) at 08/05/12 1538 Last data filed at 08/05/12 0808  Gross per 24 hour  Intake 1340.42 ml  Output   4100 ml  Net -2759.58 ml   Filed Weights   07/25/12 1930 08/01/12 0523 08/03/12 0000  Weight: 79.6 kg (175 lb 7.8 oz) 94.6 kg (208 lb 8.9 oz) 90.1 kg (198 lb 10.2 oz)    Exam:   General:  Sitting up in bed in no NAD  Cardiovascular: RRR  Respiratory: CTAB. No LE edema  Abdomen: Soft/NT/ND/+BS/OSTOMY PINK, minimal OUTPUT  Data Reviewed: Basic Metabolic Panel:  Lab 08/05/12 8295 08/04/12 0406 08/03/12 0355 08/02/12 0800 08/01/12 0513  NA 135 131* 135 133* 135    K 4.1 4.5 4.6 4.7 4.5  CL 103 102 106 104 106  CO2 25 26 23 23 23   GLUCOSE 83 86 97 92 79  BUN 4* 5* 7 5* 5*  CREATININE 0.31* 0.33* 0.35* 0.41* 0.45*  CALCIUM 8.0* 8.1* 7.8* 8.2* 7.6*  MG 1.5 1.8 1.8 1.4* 1.4*  PHOS -- -- -- -- 3.0   Liver Function Tests:  Lab 08/01/12 0513 07/31/12 0420  AST 17 20  ALT 10 10  ALKPHOS 100 106  BILITOT 0.4 0.5  PROT 5.8* 5.8*  ALBUMIN 1.0* 0.9*   No results found for this basename: LIPASE:5,AMYLASE:5 in the last 168 hours No results found for this basename: AMMONIA:5 in the last 168 hours CBC:  Lab 08/05/12 0457 08/04/12 0406 08/03/12 0355 08/02/12 0800 08/01/12 0513  WBC 4.6 5.1 5.8 8.4 5.4  NEUTROABS -- -- -- -- --  HGB 7.1* 7.3* 7.2* 8.2* 7.2*  HCT 22.6* 23.2* 23.6* 26.2* 22.7*  MCV 88.3 89.2 89.7 88.2 87.6  PLT 246 246 228 281 225   Cardiac Enzymes: No results found for this basename: CKTOTAL:5,CKMB:5,CKMBINDEX:5,TROPONINI:5 in the last 168 hours BNP (last 3 results) No results found for this basename: PROBNP:3 in the last 8760 hours CBG:  Lab 08/01/12 1228 08/01/12 1152 08/01/12 1102 08/01/12 0958  GLUCAP 125* 143* 76 56*    Recent Results (from the past 240 hour(s))  CULTURE, BLOOD (ROUTINE X 2)     Status: Normal  Collection Time   07/27/12  3:47 PM      Component Value Range Status Comment   Specimen Description BLOOD LEFT ARM   Final    Special Requests BOTTLES DRAWN AEROBIC ONLY 5CC   Final    Culture  Setup Time 07/27/2012 22:30   Final    Culture NO GROWTH 5 DAYS   Final    Report Status 08/02/2012 FINAL   Final   CULTURE, BLOOD (ROUTINE X 2)     Status: Normal   Collection Time   07/27/12  4:00 PM      Component Value Range Status Comment   Specimen Description BLOOD LEFT ARM   Final    Special Requests BOTTLES DRAWN AEROBIC ONLY 3CC   Final    Culture  Setup Time 07/27/2012 22:30   Final    Culture NO GROWTH 5 DAYS   Final    Report Status 08/02/2012 FINAL   Final   BODY FLUID CULTURE     Status: Normal  (Preliminary result)   Collection Time   07/30/12 11:59 AM      Component Value Range Status Comment   Specimen Description SHOULDER JOINT   Final    Special Requests Normal   Final    Gram Stain     Final    Value: NO WBC SEEN     NO ORGANISMS SEEN   Culture NO GROWTH 3 DAYS   Final    Report Status PENDING   Incomplete      Studies: No results found.  Scheduled Meds:    . calcium carbonate (dosed in mg elemental calcium)  1,000 mg of elemental calcium Oral BID   And  . cholecalciferol  400 Units Oral BID  .  ceFAZolin (ANCEF) IV  2 g Intravenous Q8H  . enoxaparin (LOVENOX) injection  40 mg Subcutaneous Q24H  . feeding supplement  1 Container Oral BID BM  . ferrous gluconate  324 mg Oral BID  . gabapentin  900 mg Oral TID  . magnesium oxide  400 mg Oral BID  . magnesium sulfate 1 - 4 g bolus IVPB  3 g Intravenous Once  . methadone  10 mg Oral Daily  . metroNIDAZOLE  500 mg Oral Q8H  . multivitamin with minerals  1 tablet Oral Daily  . nystatin  5 mL Oral QID  . oxybutynin  5 mg Oral TID  . phosphorus  500 mg Oral TID  . protein supplement  8 oz Oral BID  . simvastatin  40 mg Oral QHS  . sodium chloride  10-40 mL Intracatheter Q12H  . vitamin C  500 mg Oral BID  . vitamin E  400 Units Oral Daily  . zinc sulfate  220 mg Oral Daily   Continuous Infusions:    . sodium chloride 50 mL/hr at 08/04/12 1537    Principal Problem:  *SIRS (systemic inflammatory response syndrome) Active Problems:  Sacral decubitus ulcer, stage IV  Paraplegia following T10spinal cord injury  Anemia  Cardiomyopathy  Hyperkalemia  Acute renal failure  Sepsis  Inguinal hernia, left  Septic shock(785.52)  Staphylococcus aureus bacteremia with sepsis  UTI (lower urinary tract infection)  Osteomyelitis of pelvic region    Time spent: > 35 mins    [facility]  [facility] Pager [phone]. If 8PM-8AM, please contact night-coverage at [email or web address], password  [facility] 08/05/2012, 3:38 PM  LOS: 11 days

## 2012-08-06 LAB — BASIC METABOLIC PANEL
CO2: 25 mEq/L (ref 19–32)
Calcium: 7.8 mg/dL — ABNORMAL LOW (ref 8.4–10.5)
GFR calc non Af Amer: >90 mL/min (ref >90)
Glucose, Bld: 79 mg/dL (ref 70–99)
Potassium: 4.3 mEq/L (ref 3.5–5.1)
Sodium: 134 mEq/L — ABNORMAL LOW (ref 135–145)

## 2012-08-06 LAB — CBC
Hemoglobin: 7.3 g/dL — ABNORMAL LOW (ref 13.0–17.0)
MCV: 89 fL (ref 78.0–100.0)
Platelets: 291 10*3/uL (ref 150–400)
RBC: 2.63 MIL/uL — ABNORMAL LOW (ref 4.22–5.81)
WBC: 5.8 10*3/uL (ref 4.0–10.5)

## 2012-08-06 LAB — MAGNESIUM: Magnesium: 1.8 mg/dL (ref 1.5–2.5)

## 2012-08-06 MED ORDER — OXYCODONE HCL ER 15 MG PO T12A
15.0000 mg | EXTENDED_RELEASE_TABLET | Freq: Two times a day (BID) | ORAL | Status: DC
  Administered 2012-08-06 – 2012-08-08 (×4): 15 mg via ORAL
  Filled 2012-08-06 (×4): qty 1

## 2012-08-06 MED ORDER — METHOCARBAMOL 500 MG PO TABS
500.0000 mg | ORAL_TABLET | Freq: Three times a day (TID) | ORAL | Status: DC | PRN
  Administered 2012-08-06 – 2012-08-19 (×4): 500 mg via ORAL
  Filled 2012-08-06 (×4): qty 1

## 2012-08-06 MED ORDER — DEXTROSE 5 % IV SOLN: 3.0000 g | Freq: Once | INTRAVENOUS | Status: DC

## 2012-08-06 MED ORDER — MAGNESIUM SULFATE 50 % IJ SOLN
3.0000 g | Freq: Once | INTRAVENOUS | Status: AC
  Administered 2012-08-06: 3 g via INTRAVENOUS
  Filled 2012-08-06: qty 6

## 2012-08-06 MED ORDER — DOCUSATE SODIUM 50 MG/5ML PO LIQD
100.0000 mg | Freq: Two times a day (BID) | ORAL | Status: DC
  Administered 2012-08-06 – 2012-08-10 (×6): 100 mg via ORAL
  Filled 2012-08-06 (×21): qty 10
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
## 2012-08-06 NOTE — Progress Notes (Signed)
Pt refused gabapentin tabs this AM. Reports he does not take meds any longer. MD in pt's room and made aware. Gave verbal order to DC med. Done! Vwilliams, rn.

## 2012-08-06 NOTE — Progress Notes (Signed)
5 Days Post-Op  Subjective: No complaints  Objective: Vital signs in last 24 hours: Temp:  [98.2 F (36.8 C)-98.6 F (37 C)] 98.6 F (37 C) (10/27 0636) Pulse Rate:  [87-98] 96  (10/27 0636) Resp:  [18-19] 18  (10/27 0636) BP: (109-113)/(68-71) 109/68 mmHg (10/27 0636) SpO2:  [97 %-99 %] 97 % (10/27 0636) Last BM Date: 07/31/12  Intake/Output from previous day: 10/26 0701 - 10/27 0700 In: 970 [P.O.:360; I.V.:610] Out: 4200 [Urine:4200] Intake/Output this shift:    GI: soft, nontender. air in ostomy bag. incision looks good  Lab Results:   Basename 08/06/12 0300 08/05/12 0457  WBC 5.8 4.6  HGB 7.3* 7.1*  HCT 23.4* 22.6*  PLT 291 246   BMET  Basename 08/06/12 0300 08/05/12 0457  NA 134* 135  K 4.3 4.1  CL 104 103  CO2 25 25  GLUCOSE 79 83  BUN 3* 4*  CREATININE 0.32* 0.31*  CALCIUM 7.8* 8.0*   PT/INR No results found for this basename: LABPROT:2,INR:2 in the last 72 hours ABG No results found for this basename: PHART:2,PCO2:2,PO2:2,HCO3:2 in the last 72 hours  Studies/Results: No results found.  Anti-infectives: Anti-infectives     Start     Dose/Rate Route Frequency Ordered Stop   08/03/12 2000   metroNIDAZOLE (FLAGYL) tablet 500 mg        500 mg Oral 3 times per day 08/03/12 1625     08/03/12 1800   ceFAZolin (ANCEF) IVPB 2 g/50 mL premix        2 g 100 mL/hr over 30 Minutes Intravenous 3 times per day 08/03/12 1625     08/01/12 1300   meropenem (MERREM) 1 g in sodium chloride 0.9 % 100 mL IVPB  Status:  Discontinued        1 g 200 mL/hr over 30 Minutes Intravenous To Surgery 08/01/12 1039 08/01/12 1246   07/29/12 0100   meropenem (MERREM) 1 g in sodium chloride 0.9 % 100 mL IVPB  Status:  Discontinued        1 g 200 mL/hr over 30 Minutes Intravenous 3 times per day 07/29/12 0050 08/03/12 1625   07/26/12 0500   vancomycin (VANCOCIN) 750 mg in sodium chloride 0.9 % 150 mL IVPB  Status:  Discontinued        750 mg 150 mL/hr over 60 Minutes  Intravenous Every 12 hours 07/25/12 1958 07/28/12 1033   07/26/12 0200   piperacillin-tazobactam (ZOSYN) IVPB 3.375 g  Status:  Discontinued        3.375 g 12.5 mL/hr over 240 Minutes Intravenous Every 8 hours 07/25/12 1958 07/29/12 0043   07/25/12 1700   vancomycin (VANCOCIN) IVPB 1000 mg/200 mL premix        1,000 mg 200 mL/hr over 60 Minutes Intravenous  Once 07/25/12 1619 07/25/12 1807   07/25/12 1630   piperacillin-tazobactam (ZOSYN) IVPB 3.375 g  Status:  Discontinued        3.375 g 12.5 mL/hr over 240 Minutes Intravenous  Once 07/25/12 1619 07/25/12 1938          Assessment/Plan: s/p Procedure(s) (LRB) with comments: COLOSTOMY (N/A) - Open Diverting Colostomy HERNIA REPAIR INGUINAL ADULT (Left) DEBRIDEMENT WOUND (N/A) Regular diet Continue hydrotherapy for sacral wound. Plan per wound care team  LOS: 12 days    TOTH III,Renatta Shrieves S 08/06/2012

## 2012-08-07 LAB — BASIC METABOLIC PANEL
CO2: 26 mEq/L (ref 19–32)
Chloride: 105 mEq/L (ref 96–112)
GFR calc Af Amer: >90 mL/min (ref >90)
Potassium: 4.3 mEq/L (ref 3.5–5.1)

## 2012-08-07 LAB — CBC
HCT: 23.2 % — ABNORMAL LOW (ref 39.0–52.0)
Hemoglobin: 7.3 g/dL — ABNORMAL LOW (ref 13.0–17.0)
MCV: 89.6 fL (ref 78.0–100.0)
WBC: 5.3 10*3/uL (ref 4.0–10.5)
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
## 2012-08-07 NOTE — Progress Notes (Signed)
Physical Therapy Treatment Patient Details Name: [name] [name] MRN: [id number] DOB: 02-13-1976 Today's Date: 08/07/2012 Time: 4401-0272 (and 1211-1227) PT Time Calculation (min): 34 min  PT Assessment / Plan / Recommendation Comments on Treatment Session       Follow Up Recommendations  Post acute inpatient (vs LTACH)     Does the patient have the potential to tolerate intense rehabilitation  No, Recommend SNF  Barriers to Discharge        Equipment Recommendations  None recommended by PT    Recommendations for Other Services    Frequency Min 2X/week   Plan      Precautions / Restrictions Precautions Precautions: Other (comment) Precaution Comments: full thickness sacral wound; abd incision and new colostomy   Pertinent Vitals/Pain     Mobility  Bed Mobility Bed Mobility: Rolling Right;Rolling Left;Supine to Sit;Scooting to [facility];Sit to Supine Rolling Right: 4: Min assist;3: Mod assist;With rail Rolling Left: 4: Min assist;3: Mod assist;With rail Supine to Sit: 3: Mod assist;With rails;HOB flat, times 2 Sit to Supine: 3: Mod assist;With rail;HOB flat;Other (comment) (and trapeze. times 2) Scooting to [facility]: 3: Mod assist;With trapeze;With rail;Other (comment) (using HOB) Details for Bed Mobility Assistance: pt requiring cues for safety and technique Transfers Transfers: Not assessed    Exercises     PT Diagnosis:    PT Problem List:   PT Treatment Interventions:     PT Goals Acute Rehab PT Goals Time For Goal Achievement: 08/17/12 Potential to Achieve Goals: Good Pt will Roll Supine to Right Side: with modified independence PT Goal: Rolling Supine to Right Side - Progress: Progressing toward goal Pt will Roll Supine to Left Side: with modified independence PT Goal: Rolling Supine to Left Side - Progress: Progressing toward goal Pt will go Supine/Side to Sit: with min assist PT Goal: Supine/Side to Sit - Progress: Progressing toward goal Pt will Sit at Edge  of Bed: 3-5 min;with no upper extremity support;with min assist PT Goal: Sit at Edge Of Bed - Progress: Progressing toward goal  Visit Information  Last PT Received On: 08/07/12 Assistance Needed: +1 (for EOB, 2nd person avail for safety today)    Subjective Data  Subjective: it still hurts Patient Stated Goal: to sit up   Cognition  Overall Cognitive Status: Appears within functional limits for tasks assessed/performed Arousal/Alertness: Awake/alert Orientation Level: Appears intact for tasks assessed Behavior During Session: [facility] for tasks performed    Balance  Static Sitting Balance Static Sitting - Balance Support: Right upper extremity supported;Left upper extremity supported;Feet supported Static Sitting - Level of Assistance: 5: Stand by assistance;4: Min assist;3: Mod assist (improved with time) Dynamic Sitting Balance Dynamic Sitting - Balance Support: Right upper extremity supported;Left upper extremity supported;Feet supported;No upper extremity supported (wt shifting to left and right) Dynamic Sitting - Level of Assistance: 4: Min assist;5: Stand by assistance  End of Session PT - End of Session Activity Tolerance: Patient limited by pain Patient left: in bed;with call bell/phone within reach   GP     [facility] 08/07/2012, 1:06 PM

## 2012-08-07 NOTE — Progress Notes (Signed)
6 Days Post-Op  Subjective: Lots of gas in ostomy.  No nausea and tolerating diet   Objective: Vital signs in last 24 hours: Temp:  [98.1 F (36.7 C)-98.3 F (36.8 C)] 98.1 F (36.7 C) (10/28 0600) Pulse Rate:  [86-95] 86  (10/28 0600) Resp:  [18] 18  (10/28 0600) BP: (100-109)/(65-70) 109/70 mmHg (10/28 0600) SpO2:  [99 %-100 %] 99 % (10/28 0600) Last BM Date: 07/31/12  Intake/Output from previous day: 10/27 0701 - 10/28 0700 In: 660 [P.O.:360; IV Piggyback:300] Out: 3050 [Urine:3050] Intake/Output this shift:    General appearance: alert, cooperative and no distress GI: ostomy looks good.  still swollen but pink and healthy, lots of gas in bag no stool, wound looks okay  Lab Results:   Basename 08/07/12 0445 08/06/12 0300  WBC 5.3 5.8  HGB 7.3* 7.3*  HCT 23.2* 23.4*  PLT 327 291   BMET  Basename 08/07/12 0445 08/06/12 0300  NA 136 134*  K 4.3 4.3  CL 105 104  CO2 26 25  GLUCOSE 81 79  BUN 3* 3*  CREATININE 0.39* 0.32*  CALCIUM 7.9* 7.8*   PT/INR No results found for this basename: LABPROT:2,INR:2 in the last 72 hours ABG No results found for this basename: PHART:2,PCO2:2,PO2:2,HCO3:2 in the last 72 hours  Studies/Results: No results found.  Anti-infectives: Anti-infectives     Start     Dose/Rate Route Frequency Ordered Stop   08/03/12 2000   metroNIDAZOLE (FLAGYL) tablet 500 mg        500 mg Oral 3 times per day 08/03/12 1625     08/03/12 1800   ceFAZolin (ANCEF) IVPB 2 g/50 mL premix        2 g 100 mL/hr over 30 Minutes Intravenous 3 times per day 08/03/12 1625     08/01/12 1300   meropenem (MERREM) 1 g in sodium chloride 0.9 % 100 mL IVPB  Status:  Discontinued        1 g 200 mL/hr over 30 Minutes Intravenous To Surgery 08/01/12 1039 08/01/12 1246   07/29/12 0100   meropenem (MERREM) 1 g in sodium chloride 0.9 % 100 mL IVPB  Status:  Discontinued        1 g 200 mL/hr over 30 Minutes Intravenous 3 times per day 07/29/12 0050 08/03/12 1625   07/26/12 0500   vancomycin (VANCOCIN) 750 mg in sodium chloride 0.9 % 150 mL IVPB  Status:  Discontinued        750 mg 150 mL/hr over 60 Minutes Intravenous Every 12 hours 07/25/12 1958 07/28/12 1033   07/26/12 0200   piperacillin-tazobactam (ZOSYN) IVPB 3.375 g  Status:  Discontinued        3.375 g 12.5 mL/hr over 240 Minutes Intravenous Every 8 hours 07/25/12 1958 07/29/12 0043   07/25/12 1700   vancomycin (VANCOCIN) IVPB 1000 mg/200 mL premix        1,000 mg 200 mL/hr over 60 Minutes Intravenous  Once 07/25/12 1619 07/25/12 1807   07/25/12 1630   piperacillin-tazobactam (ZOSYN) IVPB 3.375 g  Status:  Discontinued        3.375 g 12.5 mL/hr over 240 Minutes Intravenous  Once 07/25/12 1619 07/25/12 1938          Assessment/Plan: s/p Procedure(s) (LRB) with comments: COLOSTOMY (N/A) - Open Diverting Colostomy HERNIA REPAIR INGUINAL ADULT (Left) DEBRIDEMENT WOUND (N/A) diet as tolerated, wound care  LOS: 13 days    Freman Lapage DAVID 08/07/2012
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
## 2012-08-08 ENCOUNTER — Inpatient Hospital Stay (HOSPITAL_COMMUNITY): Payer: Medicaid Other | Admitting: Anesthesiology

## 2012-08-08 ENCOUNTER — Encounter (HOSPITAL_COMMUNITY): Payer: Self-pay | Admitting: Cardiology

## 2012-08-08 ENCOUNTER — Encounter (HOSPITAL_COMMUNITY): Payer: Self-pay | Admitting: Anesthesiology

## 2012-08-08 ENCOUNTER — Encounter (HOSPITAL_COMMUNITY)
Admission: EM | Disposition: A | Discharge status: Skilled Nursing Facility | Payer: Self-pay | Source: Home / Self Care | Attending: Internal Medicine

## 2012-08-08 DIAGNOSIS — I339 Acute and subacute endocarditis, unspecified: Secondary | ICD-10-CM

## 2012-08-08 DIAGNOSIS — T827XXA Infection and inflammatory reaction due to other cardiac and vascular devices, implants and grafts, initial encounter: Secondary | ICD-10-CM | POA: Diagnosis present

## 2012-08-08 HISTORY — PX: TEE WITHOUT CARDIOVERSION: SHX5443

## 2012-08-08 LAB — CBC
HCT: 23.1 % — ABNORMAL LOW (ref 39.0–52.0)
Hemoglobin: 7.3 g/dL — ABNORMAL LOW (ref 13.0–17.0)
MCV: 87.8 fL (ref 78.0–100.0)
RBC: 2.63 MIL/uL — ABNORMAL LOW (ref 4.22–5.81)
RDW: 18.1 % — ABNORMAL HIGH (ref 11.5–15.5)
WBC: 4.7 10*3/uL (ref 4.0–10.5)

## 2012-08-08 LAB — BASIC METABOLIC PANEL
CO2: 25 mEq/L (ref 19–32)
Chloride: 104 mEq/L (ref 96–112)
Creatinine, Ser: 0.31 mg/dL — ABNORMAL LOW (ref 0.50–1.35)
GFR calc Af Amer: >90 mL/min (ref >90)
Potassium: 4.3 mEq/L (ref 3.5–5.1)
Sodium: 134 mEq/L — ABNORMAL LOW (ref 135–145)

## 2012-08-08 SURGERY — ECHOCARDIOGRAM, TRANSESOPHAGEAL
Anesthesia: Monitor Anesthesia Care

## 2012-08-08 MED ORDER — METHADONE HCL 5 MG PO TABS
5.0000 mg | ORAL_TABLET | Freq: Every day | ORAL | Status: DC
  Administered 2012-08-09 – 2012-08-10 (×2): 5 mg via ORAL
  Filled 2012-08-08 (×2): qty 1

## 2012-08-08 MED ORDER — LACTATED RINGERS IV SOLN
INTRAVENOUS | Status: DC | PRN
  Administered 2012-08-08: 12:00:00 via INTRAVENOUS

## 2012-08-08 MED ORDER — MIDAZOLAM HCL 5 MG/5ML IJ SOLN
INTRAMUSCULAR | Status: DC | PRN
  Administered 2012-08-08: 2 mg via INTRAVENOUS

## 2012-08-08 MED ORDER — RIFAMPIN 300 MG PO CAPS
300.0000 mg | ORAL_CAPSULE | Freq: Three times a day (TID) | ORAL | Status: DC
  Administered 2012-08-08 – 2012-08-09 (×4): 300 mg via ORAL
  Filled 2012-08-08 (×7): qty 1

## 2012-08-08 MED ORDER — BUTAMBEN-TETRACAINE-BENZOCAINE 2-2-14 % EX AERO
INHALATION_SPRAY | CUTANEOUS | Status: DC | PRN
  Administered 2012-08-08: 2 via TOPICAL

## 2012-08-08 MED ORDER — GENTAMICIN IN SALINE 1.6-0.9 MG/ML-% IV SOLN
80.0000 mg | Freq: Three times a day (TID) | INTRAVENOUS | Status: DC
  Administered 2012-08-08 – 2012-08-16 (×22): 80 mg via INTRAVENOUS
  Filled 2012-08-08 (×28): qty 50

## 2012-08-08 MED ORDER — OXYCODONE HCL ER 10 MG PO T12A
20.0000 mg | EXTENDED_RELEASE_TABLET | Freq: Two times a day (BID) | ORAL | Status: DC
  Administered 2012-08-08 – 2012-08-13 (×10): 20 mg via ORAL
  Filled 2012-08-08: qty 2
  Filled 2012-08-08 (×2): qty 1
  Filled 2012-08-08 (×3): qty 2
  Filled 2012-08-08 (×4): qty 1

## 2012-08-08 MED ORDER — ENSURE COMPLETE PO LIQD
237.0000 mL | Freq: Two times a day (BID) | ORAL | Status: DC
  Administered 2012-08-08 – 2012-08-12 (×2): 237 mL via ORAL

## 2012-08-08 MED ORDER — PROPOFOL 10 MG/ML IV EMUL
INTRAVENOUS | Status: DC | PRN
  Administered 2012-08-08: 100 ug/kg/min via INTRAVENOUS

## 2012-08-08 NOTE — Progress Notes (Signed)
[facility]    Subjective:  Underwent TEE where vegetation was seen on RA ICD lead  Antibiotics: Day #15 total antibiotics Day #6 cefazolin Day #6 metro po Finished 7 d of meropenem on 10/23  Medications:    . calcium carbonate (dosed in mg elemental calcium)  1,000 mg of elemental calcium Oral BID   And  . cholecalciferol  400 Units Oral BID  .  ceFAZolin (ANCEF) IV  2 g Intravenous Q8H  . docusate  100 mg Oral BID  . enoxaparin (LOVENOX) injection  40 mg Subcutaneous Q24H  . feeding supplement  237 mL Oral BID BM  . ferrous gluconate  324 mg Oral BID  . magnesium oxide  400 mg Oral BID  . methadone  10 mg Oral Daily  . metroNIDAZOLE  500 mg Oral Q8H  . multivitamin with minerals  1 tablet Oral Daily  . nystatin  5 mL Oral QID  . oxybutynin  5 mg Oral TID  . OxyCODONE  15 mg Oral Q12H  . phosphorus  500 mg Oral TID  . protein supplement  8 oz Oral BID  . rifampin  300 mg Oral Q8H  . simvastatin  40 mg Oral QHS  . sodium chloride  10-40 mL Intracatheter Q12H  . vitamin C  500 mg Oral BID  . vitamin E  400 Units Oral Daily  . zinc sulfate  220 mg Oral Daily  . DISCONTD: feeding supplement  1 Container Oral BID BM    Objective: Weight change:   Intake/Output Summary (Last 24 hours) at 08/08/12 1528 Last data filed at 08/08/12 1349  Gross per 24 hour  Intake    300 ml  Output   5300 ml  Net  -5000 ml   Blood pressure 101/65, pulse 86, temperature 98.5 F (36.9 C), temperature source Oral, resp. rate 18, height 6' 2.5" (1.892 m), weight 198 lb 10.2 oz (90.1 kg), SpO2 97.00%. Temp:  [98.3 F (36.8 C)-98.6 F (37 C)] 98.5 F (36.9 C) (10/29 1435) Pulse Rate:  [86-96] 86  (10/29 1435) Resp:  [10-20] 18  (10/29 1435) BP: (101-112)/(62-71) 101/65 mmHg (10/29 1435) SpO2:  [97 %-100 %] 97 % (10/29 1435)  Physical Exam: General: Alert and awake, oriented x3, not in any acute distress. HEENT: anicteric sclera, pupils reactive to  light and accommodation, EOMI CVS regular rate, normal r,  no murmur rubs or gallops, device pocket site is CDI not warm Chest: clear to auscultation bilaterally, no wheezing, rales or rhonchi Abdomen: soft, ostomy in LLQ, midline incision covered, serous drainage noted on bandage Ext = HE has no sensation below waist.  Large sacral decub -packed, but will need to see at wound change   Lab Results:  Basename 08/08/12 0415 08/07/12 0445  WBC 4.7 5.3  HGB 7.3* 7.3*  HCT 23.1* 23.2*  PLT 307 327    BMET  Basename 08/08/12 0415 08/07/12 0445  NA 134* 136  K 4.3 4.3  CL 104 105  CO2 25 26  GLUCOSE 79 81  BUN 3* 3*  CREATININE 0.31* 0.39*  CALCIUM 8.0* 7.9*    Micro Results: 10/15 blood cx 1/2 MSSA(S to oxa, clinda, erythro, rifampin, bactrim, gent, levo, vanco 1) 10/15 wound cx few MSSA but g/s shows gnr, gpc, gpr 10/15 urine cx: ESBL Ecoli 10/17 blood cx: NGTD 10/20 left hip cx (labelled as shoulder, accidentally): NGTD   Assessment/Plan: [name] [name] is a 36 y.o. male with  MSSA bacteremia with cardiac device infection/endocarditis and sacral osteomyelitis with chronic decubitus ulcer.  1)  methicillin sensitive staph aureus bacteremia and cardiac device infection/endocarditis :  - will add rifampin 300mg  Q8hr and gentamicin (synergy) starting today. Gent for 2 wks, if kidneys can tolerate. And rifampin will be for [redacted]wks along with cefazolin 2gm IV Q8hr.  - EP cardiology has been consulted to evaluate removal of AICD as part of endocarditis management  - Antibiotic clock of 6 wks will start over once device is removed.   2)  large decubitus ulcer with Chronic osteomyelitis of the left ischium and sacrum s/p debridement and Diverting colostomy. Currently on cefazolin 2gm IV Q 8hr plus oral metronidazole 500mg  TID.   Would recommend to ask plastics recommendation for management of wound for dispo  3)  extended spectrum beta-lactamase Ecoli in the urine: resolved,  treated for 7 days with meropenem,     LOS: 14 days   [name] [name] 08/08/2012, 3:28 PM

## 2012-08-08 NOTE — Progress Notes (Signed)
Patient returned from endoscopy, alert oriented without complaints.  Iv in progress via PICC.  Lunch ordered, medications given, questions answered.

## 2012-08-08 NOTE — Anesthesia Postprocedure Evaluation (Signed)
Anesthesia Post Note  Patient: [name] [name]  Procedure(s) Performed: Procedure(s) (LRB): TRANSESOPHAGEAL ECHOCARDIOGRAM (TEE) (N/A)  Anesthesia type: MAC  Patient location: PACU  Post pain: Pain level controlled  Post assessment: Post-op Vital signs reviewed  Last Vitals:  Filed Vitals:   08/08/12 1248  BP: 106/62  Pulse:   Temp:   Resp: 18    Post vital signs: Reviewed  Level of consciousness: sedated  Complications: No apparent anesthesia complications

## 2012-08-08 NOTE — Anesthesia Preprocedure Evaluation (Addendum)
Anesthesia Evaluation  Patient identified by MRN, date of birth, ID band Patient awake  General Assessment Comment:Septic shock 07/25/12  Reviewed: Allergy & Precautions, H&P , NPO status , Patient's Chart, lab work & pertinent test results  Airway Mallampati: II TM Distance: >3 FB Neck ROM: Limited    Dental No notable dental hx. (+) Dental Advisory Given   Pulmonary neg pulmonary ROS,  breath sounds clear to auscultation  Pulmonary exam normal       Cardiovascular + CAD, + Cardiac Stents and +CHF + Cardiac Defibrillator Rhythm:Regular Rate:Normal + Systolic murmurs EF 30%   Neuro/Psych T10 paraplegic  Neuromuscular disease negative psych ROS   GI/Hepatic negative GI ROS, Neg liver ROS,   Endo/Other  negative endocrine ROS  Renal/GU Renal disease  negative genitourinary   Musculoskeletal negative musculoskeletal ROS (+)   Abdominal   Peds negative pediatric ROS (+)  Hematology  (+) Blood dyscrasia, anemia ,   Anesthesia Other Findings   Reproductive/Obstetrics negative OB ROS                           Anesthesia Physical Anesthesia Plan  ASA: III  Anesthesia Plan: MAC   Post-op Pain Management:    Induction: Intravenous  Airway Management Planned: Nasal Cannula  Additional Equipment:   Intra-op Plan:   Post-operative Plan:   Informed Consent: I have reviewed the patients History and Physical, chart, labs and discussed the procedure including the risks, benefits and alternatives for the proposed anesthesia with the patient or authorized representative who has indicated his/her understanding and acceptance.   Dental advisory given  Plan Discussed with: CRNA  Anesthesia Plan Comments:         Anesthesia Quick Evaluation
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
## 2012-08-08 NOTE — CV Procedure (Signed)
TEE performed. See final results in [employer]; large oscillating density  concerning for vegetation identified; it appears to be attached to right atrial lead. Discussed with Dr Johney Frame. EP will consult for lead/device extraction. [name]
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
## 2012-08-08 NOTE — Progress Notes (Signed)
[facility] PROGRESS NOTE  [name] [name] WUJ:[id number] DOB: 1976/08/01 DOA: 07/25/2012 PCP: No primary provider on file.  Assessment/Plan: 1. Septic shock secondary to osteomyelitis and MSSA bacteremia/ESBL in the urine, infectious disease following, bacteremia likely secondary to decubitus ulcer, patient was on vancomycin which was discontinued because of no MRSA isolation. Repeat blood cultures drawn on 10/18 still pending and do not reveal anything as yet-appreciate cardiology input. Cardiac echo done on 10/16 did not show any vegetations-Tee scheduled 07/31/12 is unable to be performed-repeat scheduled. Currently finishing course of therapy for ESBL in the urine and potentially narrowing per infectious disease input [consult appreciated] by Dr. Ilsa Iha. Patient hesitant to undergo TEE with anesthesia however has agreed to, and will be done today. IV merrem has been changed to IV cefazolin. Flagyl is also been added to her regimen per ID recommendations. ID and CCS ff and appreciate input and rxcs. TEE pending to r/o infection of AICD leads. 2. 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 [name] Appreciate input into patient's care-Vitamin C and E, Zinc added to meds for wound healing-to aid healing. WOC nurse consulted 10/21. Dr Mahala Menghini spoke with Dr. Kelly Splinter 10/21 and patient will need to be seen by her in the interim. Patient will need outpatient followup at the wound care clinic on discharge. In the meanwhile, would recommend per general surgery's input a wound vacuum to keep the area clean and to promote healing. Wound care nurse already involved in patient's care. PCA morphine has been started by surgery and was discontinued secondary to decreased respiratory rate. IV merrem has been changed to IV cefazolin and oral Flagyl added to her regimen per ID recommendations.  Oxycodone PRN.  Continue  hydrotherapy. General surgery is following and appreciate input and recommendations.  3. MSSA bacteremia-repeat blood cultures pending and negative to date. 2-D echo was negative for vegetations. TEE was unsuccessful under conscious sedation. Patient agreed to TEE to be done today. IV merrem changed to IV cefazolin 2 gm q8 x 5 more weeks per ID rxcs. 4. UTI secondary to ESBL, switched to meropenem 10/19, merrem d/c'd. On cefazolin.  ID ff 5. Hypokalemia repleted-Bmet am 6. Acute kidney injury creatinine was 1.91, now improved to 0.32. NSL IVF.  7. Hypomagnesemia repleted. BMET in AM. 8. Hypophosphatemia repleted-Rpt  9. Cardiomyopathy with an EF of 25-30%. Not a candidate for ACE inhibitor/beta blocker given low blood pressure/renal insufficiency-we'll obtain records from [place] where patient was incarcerated-patient states that he saw a cardiologist by the name of Dr. Elwyn Reach who recommended this for him. Patient with borderline BP and as such will not start ACE inhib/beta blocker. 10. Elevated alkaline phosphatase-probably secondary to bone remodeling.  11. Likely Anemia of Chronic disease/ Iron deficiency anemia-Anemia panel 10/15 showed this. Would not transfuse unless peri-operative per Gen surgeon. Transfusion threshold is below 7.0. May need a dose of IV iron. Follow H/H. 12. Poor nutritional status-Albumin on Cmet was 0.9. Prealbumin 5.3. Once tolerating PO will need nuitrition consult. 13. Surgical absence of R kidney-potentially may need Aranesp-would get dosing from either hematology or oncology or nephrology  14. T10 paraplegia-needs significant therapy with occupational and physical therapy and potentially might benefit from inpatient rehabilitation  15. Chronic back pain-- patient started on long-acting OxyContin 20 mg by mouth twice a day. Continue his daily methadone and wean to off. On vicodin and oxycodone prn. Continue robaxin. 16. Prophylaxis Lovenox for DVT  prophylaxis  Code Status: Full Family Communication: Updated patient at bedside. No family present. Disposition Plan: SNF   Consultants: Gen surgery : Dr Michaell Cowing 07/25/12 Plastic Surgery:  Lazaro Arms, PA 07/31/12 Infectious disease  Dr Drue Second 07/27/12 Cardiology: Dr Tenny Craw PCCM : Dr Kendrick Fries 07/25/12  Procedures: Peripheral(refused CVL x 3)  Dopamine infusion discontinued 07/28/2012  Has PICC placed 10/20  CT abdomen and pelvis 07/28/2012 CT of the L-spine 07/25/2012 Chest x-ray 07/25/2012 2-D echo 07/26/2012 Arthrocentesis of left hip joint effusion 07/30/2012 (Diverting colostomy/left inguinal hernia repair/wound debridement per Dr. Carolynne Edouard and Donell Beers 08/01/2012   Cultures / Sepsis markers:  10/15 blood >>gpc>>1/2 sa res to pcn but oxacillin ss  10/15 urine >>ESBL  10/15 wound>>gpc>>SA>>pan ss  10/18 Blood >>neg till date  10/20 Hip aspirate cultures negative at present         Antibiotics: per ID 10/15 vanc (sacral ulcer) >>10/18  10/15 zosyn (sacral ulcer)(gnr urine) >> 10/18  10/18 Meropenem>>> 10/24 10/24 cefazolin   HPI/Subjective: Patient getting ready to go for TEE.   Objective: Filed Vitals:   08/07/12 0600 08/07/12 2200 08/08/12 0053 08/08/12 0600  BP: 109/70 102/63 103/69 109/70  Pulse: 86 96 91 94  Temp: 98.1 F (36.7 C) 98.4 F (36.9 C) 98.6 F (37 C) 98.6 F (37 C)  TempSrc: Oral Oral Oral Oral  Resp: 18 20 18 20   Height:      Weight:      SpO2: 99% 100% 99% 99%    Intake/Output Summary (Last 24 hours) at 08/08/12 1118 Last data filed at 08/08/12 0600  Gross per 24 hour  Intake      0 ml  Output   3100 ml  Net  -3100 ml   Filed Weights   07/25/12 1930 08/01/12 0523 08/03/12 0000  Weight: 79.6 kg (175 lb 7.8 oz) 94.6 kg (208 lb 8.9 oz) 90.1 kg (198 lb 10.2 oz)    Exam:   General:  Sitting up in bed in no NAD  Cardiovascular: RRR  Respiratory: CTAB. No LE edema  Abdomen: Soft/NT/ND/+BS/OSTOMY PINK, small  OUTPUT  Data Reviewed: Basic Metabolic Panel:  Lab 08/08/12 1610 08/07/12 0445 08/06/12 0300 08/05/12 0457 08/04/12 0406 08/03/12 0355 08/02/12 0800  NA 134* 136 134* 135 131* -- --  K 4.3 4.3 4.3 4.1 4.5 -- --  CL 104 105 104 103 102 -- --  CO2 25 26 25 25 26  -- --  GLUCOSE 79 81 79 83 86 -- --  BUN 3* 3* 3* 4* 5* -- --  CREATININE 0.31* 0.39* 0.32* 0.31* 0.33* -- --  CALCIUM 8.0* 7.9* 7.8* 8.0* 8.1* -- --  MG -- -- 1.8 1.5 1.8 1.8 1.4*  PHOS -- -- -- -- -- -- --   Liver Function Tests: No results found for this basename: AST:5,ALT:5,ALKPHOS:5,BILITOT:5,PROT:5,ALBUMIN:5 in the last 168 hours No results found for this basename: LIPASE:5,AMYLASE:5 in the last 168 hours No results found for this basename: AMMONIA:5 in the last 168 hours CBC:  Lab 08/08/12 0415 08/07/12 0445 08/06/12 0300 08/05/12 0457 08/04/12 0406  WBC 4.7 5.3 5.8 4.6 5.1  NEUTROABS -- -- -- -- --  HGB 7.3* 7.3* 7.3* 7.1* 7.3*  HCT 23.1* 23.2* 23.4* 22.6* 23.2*  MCV 87.8 89.6 89.0 88.3 89.2  PLT 307 327 291 246 246   Cardiac Enzymes: No results found for this basename: CKTOTAL:5,CKMB:5,CKMBINDEX:5,TROPONINI:5 in the last 168 hours BNP (last 3 results) No results found for this basename: PROBNP:3 in the last 8760 hours CBG:  Lab 08/01/12  1228 08/01/12 1152  GLUCAP 125* 143*    Recent Results (from the past 240 hour(s))  BODY FLUID CULTURE     Status: Normal (Preliminary result)   Collection Time   07/30/12 11:59 AM      Component Value Range Status Comment   Specimen Description SHOULDER JOINT   Final    Special Requests Normal   Final    Gram Stain     Final    Value: NO WBC SEEN     NO ORGANISMS SEEN   Culture NO GROWTH 3 DAYS   Final    Report Status PENDING   Incomplete      Studies: No results found.  Scheduled Meds:    . calcium carbonate (dosed in mg elemental calcium)  1,000 mg of elemental calcium Oral BID   And  . cholecalciferol  400 Units Oral BID  .  ceFAZolin (ANCEF) IV  2 g  Intravenous Q8H  . docusate  100 mg Oral BID  . enoxaparin (LOVENOX) injection  40 mg Subcutaneous Q24H  . feeding supplement  1 Container Oral BID BM  . ferrous gluconate  324 mg Oral BID  . magnesium oxide  400 mg Oral BID  . methadone  10 mg Oral Daily  . metroNIDAZOLE  500 mg Oral Q8H  . multivitamin with minerals  1 tablet Oral Daily  . nystatin  5 mL Oral QID  . oxybutynin  5 mg Oral TID  . OxyCODONE  15 mg Oral Q12H  . phosphorus  500 mg Oral TID  . protein supplement  8 oz Oral BID  . simvastatin  40 mg Oral QHS  . sodium chloride  10-40 mL Intracatheter Q12H  . vitamin C  500 mg Oral BID  . vitamin E  400 Units Oral Daily  . zinc sulfate  220 mg Oral Daily   Continuous Infusions:    Principal Problem:  *SIRS (systemic inflammatory response syndrome) Active Problems:  Sacral decubitus ulcer, stage IV  Paraplegia following T10spinal cord injury  Anemia  Cardiomyopathy  Hyperkalemia  Acute renal failure  Sepsis  Inguinal hernia, left  Septic shock(785.52)  Staphylococcus aureus bacteremia with sepsis  UTI (lower urinary tract infection)  Osteomyelitis of pelvic region    Time spent: > 35 mins    [facility]  [facility] Pager [phone]. If 8PM-8AM, please contact night-coverage at [email or web address], password [facility] 08/08/2012, 11:18 AM  LOS: 14 days

## 2012-08-08 NOTE — Progress Notes (Signed)
OT Cancellation Note  Patient Details Name: [name] [name] MRN: [id number] DOB: 1976-04-07   Cancelled Treatment:    Reason Eval/Treat Not Completed: Patient at procedure or test/ unavailable  Arielle Eber A OTR/L [phone] 08/08/2012, 1:14 PM

## 2012-08-08 NOTE — H&P (View-Only) (Signed)
[facility] PROGRESS NOTE  [name] [name] ZOX:[id number] DOB: 1976/03/27 DOA: 07/25/2012 PCP: No primary provider on file.  Assessment/Plan: 1. Septic shock secondary to osteomyelitis and MSSA bacteremia/ESBL in the urine, infectious disease following, bacteremia likely secondary to decubitus ulcer, patient was on vancomycin which was discontinued because of no MRSA isolation. Repeat blood cultures drawn on 10/18 still pending and do not reveal anything as yet-appreciate cardiology input. Cardiac echo done on 10/16 did not show any vegetations-Tee scheduled 07/31/12 is unable to be performed-repeat scheduled. Currently finishing course of therapy for ESBL in the urine and potentially narrowing per infectious disease input [consult appreciated] by Dr. Ilsa [name]. Patient hesitant to undergo TEE with anesthesia however has agreed to, and will be done tomorrow. IV merrem has been changed to IV cefazolin. Flagyl is also been added to her regimen per ID recommendations. ID and CCS ff and appreciate input and rxcs. 2. 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 [name] Appreciate input into patient's care-Vitamin C and E, Zinc added to meds for wound healing-to aid healing. WOC nurse consulted 10/21. Dr [name] [name] spoke with Dr. Kelly [name] 10/21 and patient will need to be seen by her in the interim. Patient will need outpatient followup at the wound care clinic on discharge. In the meanwhile, would recommend per general surgery's input a wound vacuum to keep the area clean and to promote healing. Wound care nurse already involved in patient's care. PCA morphine has been started by surgery and was discontinued secondary to decreased respiratory rate. IV merrem has been changed to IV cefazolin and oral Flagyl added to her regimen per ID recommendations.  Oxycodone PRN.  Continue hydrotherapy. General surgery is following  and appreciate input and recommendations.  3. MSSA bacteremia-repeat blood cultures pending and negative to date. 2-D echo was negative for vegetations. TEE was unsuccessful under conscious sedation. Patient agreed to TEE to be done tomorrow. IV merrem changed to IV cefazolin 2 gm q8 x 5 more weeks per ID rxcs. 4. UTI secondary to ESBL, switched to meropenem 10/19, merrem d/c'd. On cefazolin.  ID ff 5. Hypokalemia repleted-Bmet am 6. Acute kidney injury creatinine was 1.91, now improved to 0.32. NSL IVF.  7. Hypomagnesemia repleted. BMET in AM. 8. Hypophosphatemia repleted-Rpt  9. Cardiomyopathy with an EF of 25-30%. Not a candidate for ACE inhibitor/beta blocker given low blood pressure/renal insufficiency-we'll obtain records from [place] where patient was incarcerated-patient states that he saw a cardiologist by the name of Dr. Elwyn [name] who recommended this for him.  10. Elevated alkaline phosphatase-probably secondary to bone remodeling.  11. Likely Anemia of Chronic disease/ Iron deficiency anemia-Anemia panel 10/15 showed this. Would not transfuse unless peri-operative per Gen surgeon. Transfusion threshold is below 7.0. May need a dose of IV iron. Follow H/H. 12. Poor nutritional status-Albumin on Cmet was 0.9. Prealbumin 5.3. Once tolerating PO will need nuitrition consult. 13. Surgical absence of R kidney-potentially may need Aranesp-would get dosing from either hematology or oncology or nephrology  14. T10 paraplegia-needs significant therapy with occupational and physical therapy and potentially might benefit from inpatient rehabilitation  15. Chronic back pain-- patient started on long-acting OxyContin 50 mg by mouth twice a day. Continue his daily methadone and wean to off. On vicodin and oxycodone prn. Continue robaxin. 16. Prophylaxis Lovenox for DVT prophylaxis      Code Status: Full Family Communication: Updated patient at bedside. No family present. Disposition Plan:  SNF   Consultants: Gen  surgery : Dr [name] [name] 07/25/12 Plastic Surgery:  [name] Arms, PA 07/31/12 Infectious disease  Dr [name] [name] 07/27/12 Cardiology: Dr [name] [name] PCCM : Dr [name] [name] 07/25/12  Procedures: Peripheral(refused CVL x 3)  Dopamine infusion discontinued 07/28/2012  Has PICC placed 10/20  CT abdomen and pelvis 07/28/2012 CT of the L-spine 07/25/2012 Chest x-ray 07/25/2012 2-D echo 07/26/2012 Arthrocentesis of left hip joint effusion 07/30/2012 (Diverting colostomy/left inguinal hernia repair/wound debridement per Dr. Carolynne [name] and [name] [name] 08/01/2012   Cultures / Sepsis markers:  10/15 blood >>gpc>>1/2 sa res to pcn but oxacillin ss  10/15 urine >>ESBL  10/15 wound>>gpc>>SA>>pan ss  10/18 Blood >>neg till date  10/20 Hip aspirate cultures negative at present         Antibiotics: per ID 10/15 vanc (sacral ulcer) >>10/18  10/15 zosyn (sacral ulcer)(gnr urine) >> 10/18  10/18 Meropenem>>> 10/24 10/24 cefazolin   HPI/Subjective: Patient worried about procedure tomorrow secondary to anesthesia. Patient states doesn't do too well with anesthesia and worried. Patient still with some abdominal discomfort. Patient tolerating oral intake.  Objective: Filed Vitals:   08/06/12 0636 08/06/12 1355 08/06/12 2200 08/07/12 0600  BP: 109/68 100/65 107/69 109/70  Pulse: 96 88 95 86  Temp: 98.6 F (37 C) 98.3 F (36.8 C) 98.2 F (36.8 C) 98.1 F (36.7 C)  TempSrc: Oral Oral Oral Oral  Resp: 18 18 18 18   Height:      Weight:      SpO2: 97% 100% 100% 99%    Intake/Output Summary (Last 24 hours) at 08/07/12 1727 Last data filed at 08/07/12 0600  Gross per 24 hour  Intake    240 ml  Output   2100 ml  Net  -1860 ml   Filed Weights   07/25/12 1930 08/01/12 0523 08/03/12 0000  Weight: 79.6 kg (175 lb 7.8 oz) 94.6 kg (208 lb 8.9 oz) 90.1 kg (198 lb 10.2 oz)    Exam:   General:  Sitting up in bed in no NAD  Cardiovascular: RRR  Respiratory: CTAB. No LE edema  Abdomen:  Soft/NT/ND/+BS/OSTOMY PINK, small OUTPUT  Data Reviewed: Basic Metabolic Panel:  Lab 08/07/12 1610 08/06/12 0300 08/05/12 0457 08/04/12 0406 08/03/12 0355 08/02/12 0800 08/01/12 0513  NA 136 134* 135 131* 135 -- --  K 4.3 4.3 4.1 4.5 4.6 -- --  CL 105 104 103 102 106 -- --  CO2 26 25 25 26 23  -- --  GLUCOSE 81 79 83 86 97 -- --  BUN 3* 3* 4* 5* 7 -- --  CREATININE 0.39* 0.32* 0.31* 0.33* 0.35* -- --  CALCIUM 7.9* 7.8* 8.0* 8.1* 7.8* -- --  MG -- 1.8 1.5 1.8 1.8 1.4* --  PHOS -- -- -- -- -- -- 3.0   Liver Function Tests:  Lab 08/01/12 0513  AST 17  ALT 10  ALKPHOS 100  BILITOT 0.4  PROT 5.8*  ALBUMIN 1.0*   No results found for this basename: LIPASE:5,AMYLASE:5 in the last 168 hours No results found for this basename: AMMONIA:5 in the last 168 hours CBC:  Lab 08/07/12 0445 08/06/12 0300 08/05/12 0457 08/04/12 0406 08/03/12 0355  WBC 5.3 5.8 4.6 5.1 5.8  NEUTROABS -- -- -- -- --  HGB 7.3* 7.3* 7.1* 7.3* 7.2*  HCT 23.2* 23.4* 22.6* 23.2* 23.6*  MCV 89.6 89.0 88.3 89.2 89.7  PLT 327 291 246 246 228   Cardiac Enzymes: No results found for this basename: CKTOTAL:5,CKMB:5,CKMBINDEX:5,TROPONINI:5 in the last 168 hours BNP (last 3 results) No results found for this  basename: PROBNP:3 in the last 8760 hours CBG:  Lab 08/01/12 1228 08/01/12 1152 08/01/12 1102 08/01/12 0958  GLUCAP 125* 143* 76 56*    Recent Results (from the past 240 hour(s))  BODY FLUID CULTURE     Status: Normal (Preliminary result)   Collection Time   07/30/12 11:59 AM      Component Value Range Status Comment   Specimen Description SHOULDER JOINT   Final    Special Requests Normal   Final    Gram Stain     Final    Value: NO WBC SEEN     NO ORGANISMS SEEN   Culture NO GROWTH 3 DAYS   Final    Report Status PENDING   Incomplete      Studies: No results found.  Scheduled Meds:    . calcium carbonate (dosed in mg elemental calcium)  1,000 mg of elemental calcium Oral BID   And  .  cholecalciferol  400 Units Oral BID  .  ceFAZolin (ANCEF) IV  2 g Intravenous Q8H  . docusate  100 mg Oral BID  . enoxaparin (LOVENOX) injection  40 mg Subcutaneous Q24H  . feeding supplement  1 Container Oral BID BM  . ferrous gluconate  324 mg Oral BID  . magnesium oxide  400 mg Oral BID  . methadone  10 mg Oral Daily  . metroNIDAZOLE  500 mg Oral Q8H  . multivitamin with minerals  1 tablet Oral Daily  . nystatin  5 mL Oral QID  . oxybutynin  5 mg Oral TID  . OxyCODONE  15 mg Oral Q12H  . phosphorus  500 mg Oral TID  . protein supplement  8 oz Oral BID  . simvastatin  40 mg Oral QHS  . sodium chloride  10-40 mL Intracatheter Q12H  . vitamin C  500 mg Oral BID  . vitamin E  400 Units Oral Daily  . zinc sulfate  220 mg Oral Daily   Continuous Infusions:    Principal Problem:  *SIRS (systemic inflammatory response syndrome) Active Problems:  Sacral decubitus ulcer, stage IV  Paraplegia following T10spinal cord injury  Anemia  Cardiomyopathy  Hyperkalemia  Acute renal failure  Sepsis  Inguinal hernia, left  Septic shock(785.52)  Staphylococcus aureus bacteremia with sepsis  UTI (lower urinary tract infection)  Osteomyelitis of pelvic region    Time spent: > 35 mins    [facility]  [facility] Pager [phone]. If 8PM-8AM, please contact night-coverage at [email or web address], password [facility] 08/07/2012, 5:27 PM  LOS: 13 days
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
## 2012-08-08 NOTE — Interval H&P Note (Signed)
History and Physical Interval Note:  08/08/2012 12:06 PM  [name] [name]  has presented today for surgery, with the diagnosis of vegetation  The various methods of treatment have been discussed with the patient and family. After consideration of risks, benefits and other options for treatment, the patient has consented to  Procedure(s) (LRB) with comments: TRANSESOPHAGEAL ECHOCARDIOGRAM (TEE) (N/A) - paraplegic as a surgical intervention .  The patient's history has been reviewed, patient examined, no change in status, stable for surgery.  I have reviewed the patient's chart and labs.  Questions were answered to the patient's satisfaction.     [name]

## 2012-08-08 NOTE — Progress Notes (Signed)
Echocardiogram Echocardiogram Transesophageal has been performed.  [name] [name] 08/08/2012, 1:00 PM

## 2012-08-08 NOTE — Transfer of Care (Signed)
Immediate Anesthesia Transfer of Care Note  Patient: [name] [name]  Procedure(s) Performed: Procedure(s) (LRB) with comments: TRANSESOPHAGEAL ECHOCARDIOGRAM (TEE) (N/A) - paraplegic  Patient Location: PACU  Anesthesia Type:MAC  Level of Consciousness: awake and sedated  Airway & Oxygen Therapy: Patient Spontanous Breathing and Patient connected to nasal cannula oxygen  Post-op Assessment: Report given to PACU RN and Post -op Vital signs reviewed and stable  Post vital signs: Reviewed and stable  Complications: no complications

## 2012-08-09 ENCOUNTER — Encounter (HOSPITAL_COMMUNITY): Payer: Self-pay | Admitting: Cardiology

## 2012-08-09 ENCOUNTER — Inpatient Hospital Stay (HOSPITAL_COMMUNITY): Payer: Medicaid Other

## 2012-08-09 MED ORDER — ALUM & MAG HYDROXIDE-SIMETH 200-200-20 MG/5ML PO SUSP
15.0000 mL | Freq: Four times a day (QID) | ORAL | Status: DC | PRN
  Administered 2012-08-09 – 2012-08-16 (×2): 15 mL via ORAL
  Filled 2012-08-09: qty 60
  Filled 2012-08-09 (×2): qty 30

## 2012-08-09 MED ORDER — POLYETHYLENE GLYCOL 3350 17 G PO PACK
17.0000 g | PACK | Freq: Every day | ORAL | Status: DC
  Administered 2012-08-09: 17 g via ORAL
  Filled 2012-08-09 (×2): qty 1

## 2012-08-09 MED ORDER — RIFAMPIN 300 MG PO CAPS
300.0000 mg | ORAL_CAPSULE | Freq: Three times a day (TID) | ORAL | Status: DC
  Administered 2012-08-09 – 2012-08-12 (×6): 300 mg via ORAL
  Filled 2012-08-09 (×15): qty 1

## 2012-08-09 MED ORDER — SODIUM CHLORIDE 0.9 % IV SOLN
1020.0000 mg | Freq: Once | INTRAVENOUS | Status: AC
  Administered 2012-08-09: 1020 mg via INTRAVENOUS
  Filled 2012-08-09: qty 34

## 2012-08-09 MED ORDER — RANITIDINE HCL 150 MG/10ML PO SYRP
150.0000 mg | ORAL_SOLUTION | Freq: Two times a day (BID) | ORAL | Status: DC | PRN
  Administered 2012-08-09 – 2012-08-16 (×2): 150 mg via ORAL
  Filled 2012-08-09 (×4): qty 10
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
## 2012-08-09 NOTE — Progress Notes (Addendum)
Subjective: No specific complaints. Denies any specific pain.   Objective: Vital signs in last 24 hours: Filed Vitals:   08/08/12 1346 08/08/12 1435 08/08/12 2230 08/09/12 0556  BP: 108/71 101/65 121/73 116/70  Pulse:  86 96 93  Temp: 98.3 F (36.8 C) 98.5 F (36.9 C) 98.2 F (36.8 C) 98.2 F (36.8 C)  TempSrc: Oral Oral Oral Oral  Resp: 16 18 20 20   Height:      Weight:      SpO2: 100% 97% 99% 100%   Weight change:   Intake/Output Summary (Last 24 hours) at 08/09/12 1056 Last data filed at 08/08/12 2130  Gross per 24 hour  Intake    300 ml  Output   2750 ml  Net  -2450 ml    Physical Exam: General: Awake, Oriented, No acute distress. HEENT: EOMI. Neck: Supple CV: S1 and S2 Lungs: Clear to ascultation bilaterally Abdomen: Soft, Nontender, Nondistended, +bowel sounds. Ostomy in place. Midline staples in place. Ext: Good pulses. Trace edema.  Lab Results: Basic Metabolic Panel:  Lab 08/08/12 9811 08/07/12 0445 08/06/12 0300 08/05/12 0457 08/04/12 0406 08/03/12 0355  NA 134* 136 134* 135 131* --  K 4.3 4.3 4.3 4.1 4.5 --  CL 104 105 104 103 102 --  CO2 25 26 25 25 26  --  GLUCOSE 79 81 79 83 86 --  BUN 3* 3* 3* 4* 5* --  CREATININE 0.31* 0.39* 0.32* 0.31* 0.33* --  CALCIUM 8.0* 7.9* 7.8* 8.0* 8.1* --  MG -- -- 1.8 1.5 1.8 1.8  PHOS -- -- -- -- -- --   Liver Function Tests: No results found for this basename: AST:5,ALT:5,ALKPHOS:5,BILITOT:5,PROT:5,ALBUMIN:5 in the last 168 hours No results found for this basename: LIPASE:5,AMYLASE:5 in the last 168 hours No results found for this basename: AMMONIA:5 in the last 168 hours CBC:  Lab 08/08/12 0415 08/07/12 0445 08/06/12 0300 08/05/12 0457 08/04/12 0406  WBC 4.7 5.3 5.8 4.6 5.1  NEUTROABS -- -- -- -- --  HGB 7.3* 7.3* 7.3* 7.1* 7.3*  HCT 23.1* 23.2* 23.4* 22.6* 23.2*  MCV 87.8 89.6 89.0 88.3 89.2  PLT 307 327 291 246 246   Cardiac Enzymes: No results found for this basename:  CKTOTAL:5,CKMB:5,CKMBINDEX:5,TROPONINI:5 in the last 168 hours BNP (last 3 results) No results found for this basename: PROBNP:3 in the last 8760 hours CBG: No results found for this basename: GLUCAP:5 in the last 168 hours No results found for this basename: HGBA1C:5 in the last 72 hours Other Labs: No components found with this basename: POCBNP:3 No results found for this basename: DDIMER:2 in the last 168 hours No results found for this basename: CHOL:2,HDL:2,LDLCALC:2,TRIG:2,CHOLHDL:2,LDLDIRECT:2 in the last 168 hours No results found for this basename: TSH,T4TOTAL,FREET3,T3FREE,FREET4,THYROIDAB in the last 168 hours No results found for this basename: VITAMINB12:2,FOLATE:2,FERRITIN:2,TIBC:2,IRON:2,RETICCTPCT:2 in the last 168 hours  Micro Results: Recent Results (from the past 240 hour(s))  BODY FLUID CULTURE     Status: Normal (Preliminary result)   Collection Time   07/30/12 11:59 AM      Component Value Range Status Comment   Specimen Description SHOULDER JOINT   Final    Special Requests Normal   Final    Gram Stain     Final    Value: NO WBC SEEN     NO ORGANISMS SEEN   Culture NO GROWTH 3 DAYS   Final    Report Status PENDING   Incomplete     Studies/Results: No results found.  Medications: I have reviewed  the patient's current medications. Scheduled Meds:   . calcium carbonate (dosed in mg elemental calcium)  1,000 mg of elemental calcium Oral BID   And  . cholecalciferol  400 Units Oral BID  .  ceFAZolin (ANCEF) IV  2 g Intravenous Q8H  . docusate  100 mg Oral BID  . enoxaparin (LOVENOX) injection  40 mg Subcutaneous Q24H  . feeding supplement  237 mL Oral BID BM  . ferrous gluconate  324 mg Oral BID  . gentamicin  80 mg Intravenous Q8H  . magnesium oxide  400 mg Oral BID  . methadone  5 mg Oral Daily  . metroNIDAZOLE  500 mg Oral Q8H  . multivitamin with minerals  1 tablet Oral Daily  . nystatin  5 mL Oral QID  . oxybutynin  5 mg Oral TID  . OxyCODONE   20 mg Oral Q12H  . phosphorus  500 mg Oral TID  . protein supplement  8 oz Oral BID  . rifampin  300 mg Oral Q8H  . simvastatin  40 mg Oral QHS  . sodium chloride  10-40 mL Intracatheter Q12H  . vitamin C  500 mg Oral BID  . vitamin E  400 Units Oral Daily  . zinc sulfate  220 mg Oral Daily  . DISCONTD: feeding supplement  1 Container Oral BID BM  . DISCONTD: methadone  10 mg Oral Daily  . DISCONTD: OxyCODONE  15 mg Oral Q12H   Continuous Infusions:  PRN Meds:.acetaminophen, acetaminophen, diphenhydrAMINE, diphenhydrAMINE, HYDROcodone-acetaminophen, methocarbamol, morphine injection, naloxone, ondansetron (ZOFRAN) IV, ondansetron, oxyCODONE, phenol, promethazine, sodium chloride, sodium chloride, DISCONTD: butamben-tetracaine-benzocaine  Assessment/Plan: Septic shock secondary to osteomyelitis and MSSA bacteremia/ESBL in the urine, bacteremia likely secondary to decubitus ulcer.  ID following. Patient initially was on vancomycin which was discontinued because of no MRSA isolation. Repeat blood cultures drawn on 07/27/2012 which showed no growth to date. Cardiac echo done on 10/16 did not show any vegetations. Currently finished course of therapy for ESBL in the urine and potentially narrowing per infectious disease input (consult appreciated) by Dr. Ilsa Iha. Patient had TEE on 08/08/2012 which showed large oscillating density on right atrial lead concerning for vegetation. IV merrem has been changed to IV cefazolin. Continue Flagyl. ID and CCS following and appreciate input and recommendations.  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 Appreciate input into patient's care-Vitamin C and E, Zinc added to meds for wound healing-to aid healing. WOC nurse consulted 10/21. Dr Mahala Menghini spoke with Dr. Kelly Splinter 10/21 and patient will need to be seen by her in the interim. Patient will need outpatient  followup at the wound care clinic on discharge. In the meanwhile, would recommend per general surgery's input a wound vacuum to keep the area clean and to promote healing. Wound care nurse already involved in patient's care. PCA morphine has been started by surgery and was discontinued secondary to decreased respiratory rate. IV merrem has been changed to IV cefazolin and oral Flagyl added to her regimen per ID recommendations. Oxycodone PRN. Continue hydrotherapy. General surgery is following and appreciate input and recommendations.   MSSA bacteremia Repeat blood cultures negative to date. 2-D echo was negative for vegetations. TEE showed right atrial lead concerning for vegetation. IV merrem changed to IV cefazolin 2 gm q8 x 5 more weeks per ID rxcs.   Right atrial lead concerning for vegetation on TEE on 08/08/2012 Cardiology planning on taking ICD on 08/11/2012.  UTI  secondary to ESBL Switched to meropenem 10/19, merrem d/c'd. ID following.   Hypokalemia Repleted  Acute kidney injury Creatinine was 1.91, now improved to 0.32. NSL IVF.   Hypomagnesemia  Repleted.   Hypophosphatemia  Repleted.  Cardiomyopathy with an EF of 25-30% Not a candidate for ACE inhibitor/beta blocker given low blood pressure/renal insufficiency. Records from [place] pending, where patient was incarcerated, patient states that he saw a cardiologist by the name of Dr. Elwyn Reach who recommended this for him. Patient with borderline BP and as such will not start ACE inhib/beta blocker.   Elevated alkaline phosphatase Probably secondary to bone remodeling.   Likely Anemia of Chronic disease/Iron deficiency anemia Anemia panel 10/15 showed this. Would not transfuse unless peri-operative per Gen surgeon. Transfusion threshold is below 7.0. Given serum Iron is 15, will give the patient dose of Feraheme IV on 08/09/2012. Follow CBC. Continue ferrous gluconate.  Poor nutritional status Albumin on Cmet was 0.9.  Prealbumin 5.3. Once tolerating PO will need nuitrition consult.   Surgical absence of R kidney Potentially may need Aranesp-would get dosing from either hematology or oncology or nephrology   T10 paraplegia Continue PT/OT.  Potentially might benefit from inpatient rehabilitation   Chronic back pain Patient on long-acting OxyContin 20 mg by mouth twice a day. Continue his daily methadone and wean to off. On vicodin and oxycodone prn. Continue robaxin.   Prophylaxis  Lovenox for DVT prophylaxis   Code Status: Full  Family Communication: Updated patient at bedside. No family present.  Disposition Plan: SNF   Consultants:  Gen surgery : Dr Michaell Cowing 07/25/12  Plastic Surgery: Lazaro Arms, PA 07/31/12  Infectious disease Dr Drue Second 07/27/12  Cardiology: Dr Tenny Craw  PCCM : Dr Kendrick Fries 07/25/12  Procedures:  Peripheral(refused CVL x 3)  Dopamine infusion discontinued 07/28/2012  Has PICC placed 10/20  CT abdomen and pelvis 07/28/2012  CT of the L-spine 07/25/2012  Chest x-ray 07/25/2012  2-D echo 07/26/2012  Arthrocentesis of left hip joint effusion 07/30/2012  (Diverting colostomy/left inguinal hernia repair/wound debridement) per Dr. Carolynne Edouard and Donell Beers 08/01/2012  TEE 08/08/2012  Cultures / Sepsis markers:  10/15 blood >>gpc>>1/2 sa res to pcn but oxacillin ss  10/15 urine >>ESBL  10/15 wound>>gpc>>SA>>pan ss  10/18 Blood >>neg till date  10/20 Hip aspirate cultures negative at present   Antibiotics: per ID  10/15 vanc (sacral ulcer) >>10/18  10/15 zosyn (sacral ulcer)(gnr urine) >> 10/18  10/18 Meropenem >> 10/24  10/24 cefazolin 10/24 metonidazole   LOS: 15 days  Latanya Hemmer A, MD 08/09/2012, 10:56 AM

## 2012-08-09 NOTE — Progress Notes (Addendum)
[facility]    Subjective:  Patient states he has some considerable pain when he undergoes wound dressing change/hydrotherapy. He understands the importance of his defibrillator removal. He reports poor sleep overnight. Some nausea with his rifampin  Antibiotics: Day #16 total antibiotics Day #2 gent Day #2 rifampin Day #7 cefazolin Day #7 metro po Finished 7 d of meropenem on 10/23  Medications:    . calcium carbonate (dosed in mg elemental calcium)  1,000 mg of elemental calcium Oral BID   And  . cholecalciferol  400 Units Oral BID  .  ceFAZolin (ANCEF) IV  2 g Intravenous Q8H  . docusate  100 mg Oral BID  . enoxaparin (LOVENOX) injection  40 mg Subcutaneous Q24H  . feeding supplement  237 mL Oral BID BM  . ferrous gluconate  324 mg Oral BID  . ferumoxytol  1,020 mg Intravenous Once  . gentamicin  80 mg Intravenous Q8H  . magnesium oxide  400 mg Oral BID  . methadone  5 mg Oral Daily  . metroNIDAZOLE  500 mg Oral Q8H  . multivitamin with minerals  1 tablet Oral Daily  . nystatin  5 mL Oral QID  . oxybutynin  5 mg Oral TID  . OxyCODONE  20 mg Oral Q12H  . phosphorus  500 mg Oral TID  . protein supplement  8 oz Oral BID  . rifampin  300 mg Oral Q8H  . simvastatin  40 mg Oral QHS  . sodium chloride  10-40 mL Intracatheter Q12H  . vitamin C  500 mg Oral BID  . vitamin E  400 Units Oral Daily  . zinc sulfate  220 mg Oral Daily  . DISCONTD: methadone  10 mg Oral Daily  . DISCONTD: OxyCODONE  15 mg Oral Q12H    Objective: Weight change:   Intake/Output Summary (Last 24 hours) at 08/09/12 1518 Last data filed at 08/08/12 2130  Gross per 24 hour  Intake      0 ml  Output    550 ml  Net   -550 ml   Blood pressure 100/66, pulse 96, temperature 98.4 F (36.9 C), temperature source Oral, resp. rate 12, height 6' 2.5" (1.892 m), weight 198 lb 10.2 oz (90.1 kg), SpO2 99.00%. Temp:  [98.2 F (36.8 C)-98.4 F (36.9 C)] 98.4 F (36.9 C)  (10/30 1400) Pulse Rate:  [93-96] 96  (10/30 1400) Resp:  [12-20] 12  (10/30 1400) BP: (100-121)/(66-73) 100/66 mmHg (10/30 1400) SpO2:  [99 %-100 %] 99 % (10/30 1400)  Physical Exam: General: Alert and awake, oriented x3, not in any acute distress. HEENT: anicteric sclera, pupils reactive to light and accommodation, EOMI CVS regular rate, normal r,  no murmur rubs or gallops, device pocket site is CDI not warm Chest: clear to auscultation bilaterally, no wheezing, rales or rhonchi Abdomen: soft, ostomy in LLQ, midline incision covered, serous drainage noted on bandage Ext = HE has no sensation below waist.  Large sacral decub -packed, but will need to see at wound change   Lab Results:  [name] 08/08/12 0415 08/07/12 0445  WBC 4.7 5.3  HGB 7.3* 7.3*  HCT 23.1* 23.2*  PLT 307 327    BMET  Basename 08/08/12 0415 08/07/12 0445  NA 134* 136  K 4.3 4.3  CL 104 105  CO2 25 26  GLUCOSE 79 81  BUN 3* 3*  CREATININE 0.31* 0.39*  CALCIUM 8.0* 7.9*    Micro Results: 10/15 blood  cx 1/2 MSSA(S to oxa, clinda, erythro, rifampin, bactrim, gent, levo, vanco 1) 10/15 wound cx few MSSA but g/s shows gnr, gpc, gpr 10/15 urine cx: ESBL Ecoli 10/17 blood cx: NGTD 10/20 left hip cx (labelled as shoulder, accidentally): NGTD   Assessment/Plan: [name] [name] is a 36 y.o. male with  MSSA bacteremia c/b cardiac device infection/endocarditis and sacral osteomyelitis with chronic decubitus ulcer.  1)  methicillin sensitive staph aureus bacteremia and cardiac device infection/endocarditis :  - continue on cefazolin 2gm IV Q8hr, rifampin 300mg  Q8hr and gentamicin (synergy). Gent for 2 wks, if kidneys can tolerate. And rifampin will be for [redacted]wks along with cefazolin 2gm IV Q8hr.  - EP cardiology is planning for removal of AICD as part of endocarditis management on Friday  - Antibiotic clock of 6 wks will start over once device is removed.   2)  large decubitus ulcer with Chronic  osteomyelitis of the left ischium and sacrum s/p debridement and Diverting colostomy. Currently on cefazolin 2gm IV Q 8hr plus oral metronidazole 500mg  TID.   Would recommend to ask plastics recommendation for management of wound for dispo  3)  Therapeutic drug monitoring = please get BMP tomorrow in order to determine if he is having any renal impairment from gentamicin  4) nausea = consider premedicating with  anti emetic piror to rifampin. Will schedule it with meals to minimize nausea.  5) extended spectrum beta-lactamase Ecoli in the urine: resolved, treated for 7 days with meropenem,     LOS: 15 days   [name] [name] 08/09/2012, 3:18 PM

## 2012-08-09 NOTE — Progress Notes (Signed)
Nutrition Follow-up  Intervention: Nursing to continue to promote nutrition supplement intake to meet pt's increased protein needs. Will monitor.   Diet Order: Regular, Ensure Complete BID, Unjury chicken soup BID  - Attempted to meet with pt this morning, however pt c/o intense pain, nurse notified. Pt now working with PT. Noted pt's appetite has improved with 75% meal intake documented the past few days. Nutrition supplement intake variable, pt refused to drink Ensure or Unjury this morning but had been drinking some earlier in the week, no amount documented.   Meds: Scheduled Meds:   . calcium carbonate (dosed in mg elemental calcium)  1,000 mg of elemental calcium Oral BID   And  . cholecalciferol  400 Units Oral BID  .  ceFAZolin (ANCEF) IV  2 g Intravenous Q8H  . docusate  100 mg Oral BID  . enoxaparin (LOVENOX) injection  40 mg Subcutaneous Q24H  . feeding supplement  237 mL Oral BID BM  . ferrous gluconate  324 mg Oral BID  . gentamicin  80 mg Intravenous Q8H  . magnesium oxide  400 mg Oral BID  . methadone  5 mg Oral Daily  . metroNIDAZOLE  500 mg Oral Q8H  . multivitamin with minerals  1 tablet Oral Daily  . nystatin  5 mL Oral QID  . oxybutynin  5 mg Oral TID  . OxyCODONE  20 mg Oral Q12H  . phosphorus  500 mg Oral TID  . protein supplement  8 oz Oral BID  . rifampin  300 mg Oral Q8H  . simvastatin  40 mg Oral QHS  . sodium chloride  10-40 mL Intracatheter Q12H  . vitamin C  500 mg Oral BID  . vitamin E  400 Units Oral Daily  . zinc sulfate  220 mg Oral Daily  . DISCONTD: feeding supplement  1 Container Oral BID BM  . DISCONTD: methadone  10 mg Oral Daily  . DISCONTD: OxyCODONE  15 mg Oral Q12H   Continuous Infusions:  PRN Meds:.acetaminophen, acetaminophen, diphenhydrAMINE, diphenhydrAMINE, HYDROcodone-acetaminophen, methocarbamol, morphine injection, naloxone, ondansetron (ZOFRAN) IV, ondansetron, oxyCODONE, phenol, promethazine, sodium chloride, sodium chloride,  DISCONTD: butamben-tetracaine-benzocaine   CMP     Component Value Date/Time   NA 134* 08/08/2012 0415   K 4.3 08/08/2012 0415   CL 104 08/08/2012 0415   CO2 25 08/08/2012 0415   GLUCOSE 79 08/08/2012 0415   BUN 3* 08/08/2012 0415   CREATININE 0.31* 08/08/2012 0415   CALCIUM 8.0* 08/08/2012 0415   PROT 5.8* 08/01/2012 0513   ALBUMIN 1.0* 08/01/2012 0513   AST 17 08/01/2012 0513   ALT 10 08/01/2012 0513   ALKPHOS 100 08/01/2012 0513   BILITOT 0.4 08/01/2012 0513   GFRNONAA >90 08/08/2012 0415   GFRAA >90 08/08/2012 0415    CBG (last 3)  No results found for this basename: GLUCAP:3 in the last 72 hours   Intake/Output Summary (Last 24 hours) at 08/09/12 1139 Last data filed at 08/08/12 2130  Gross per 24 hour  Intake    300 ml  Output   2750 ml  Net  -2450 ml   Last BM - 10/21  Weight Status:   10/15 197 lb 10/24 198 lb 10.2 oz  Estimated needs:   2200-2400 calories 120-145g protein  Nutrition Dx:  Inadequate oral intake - improved  Goal: Intake of >90% of meals/supplements - not met  Monitor:  Weights, labs, intake, BM   Levon Hedger MS, RD, LDN [phone] Pager [phone] After Hours Pager

## 2012-08-09 NOTE — Progress Notes (Signed)
Patient refused ordered dressing change x2. On first attempt, patient stated, "I'm in too much pain." Pain medication had been given 45 minutes prior to dressing change attempt. Patient stated, "please come back in the morning." This RN attempted dressing change this AM when patient requested bathing, at that time, patient voiced "PT does my dressing changes. I had a bad day yesterday, it's just too much. I don't like my wound being changed more than once." This RN informed patient of order for 2nd dressing change other than PT change. Patient refused. Will report information off to oncoming nurse during shift report. No other issues to note this shift. Staff will continue to monitor.

## 2012-08-09 NOTE — Progress Notes (Signed)
Physical Therapy Treatment Patient Details Name: [name] [name] MRN: [id number] DOB: Aug 03, 1976 Today's Date: 08/09/2012 Time: 4098-1191 PT Time Calculation (min): 32 min  PT Assessment / Plan / Recommendation Comments on Treatment Session  Pt participated well with session but very distracted by possible upcoming surgery in next few days. Pt very upset about communication that took place between pt and Careers adviser. Pt vented for a large portion of session. Encourged pt to continue to communicate with MDs/surgeon about situation/procedure. RN present at end of session-stated she would notify charge nurse of pt's concerns. Pt states that he would like to begin transfer training on next week if able (after surgery).     Follow Up Recommendations  Post acute inpatient-LTACH vs SNF.     Does the patient have the potential to tolerate intense rehabilitation  No, Recommend SNF  Barriers to Discharge        Equipment Recommendations  None recommended by PT    Recommendations for Other Services    Frequency Min 2X/week   Plan Discharge plan remains appropriate    Precautions / Restrictions Precautions Precautions: Other (comment) Precaution Comments: full thickness sacral wound; abd incision, new colostomy Restrictions Weight Bearing Restrictions: No   Pertinent Vitals/Pain Abdomen-unrated; LE spasms-"burning"-unrated    Mobility  Bed Mobility Details for Bed Mobility Assistance: Pt already in long-sitting when therapist arrived. Min assist for bed mobility (long-sitting<>sitting EOB). Assist for bil LEs onto bed. Pt able to use UE to assist bil LEs off EOB. Transfers Transfers: Not assessed Details for Transfer Assistance: Pt not up to practicing wheelchair transfers at this time. Pt states he may want to begin transfer training on next week, if possible.     Exercises     PT Diagnosis:    PT Problem List:   PT Treatment Interventions:     PT Goals Acute Rehab PT Goals Pt  will go Supine/Side to Sit: with supervision PT Goal: Supine/Side to Sit - Progress: Updated due to goal met Pt will Sit at [facility] of Bed: 3-5 min;with no upper extremity support;with min assist PT Goal: Sit at Edge Of Bed - Progress: Progressing toward goal  Visit Information  Last PT Received On: 08/09/12 Assistance Needed: +1 (may need 2nd person for transfer practice)    Subjective Data  Subjective: "Its just a little spasm today" Patient Stated Goal: Pt very upset/worried about possible procedure. Would like to try transfers maybe next week.   Cognition  Overall Cognitive Status: Appears within functional limits for tasks assessed/performed Arousal/Alertness: Awake/alert Orientation Level: Appears intact for tasks assessed Behavior During Session: Other (comment) Cognition - Other Comments: pt visibly upset/worried about possible procedure in next few days.    Balance  Balance Balance Assessed: Yes Static Sitting Balance Static Sitting - Balance Support: Bilateral upper extremity supported;Feet supported Static Sitting - Level of Assistance: 4: Min assist Static Sitting - Comment/# of Minutes: Sat EOB 10-15 minutes. VCs for posture-pt tends to maintain very flexed head/neck/trunk posture. Pt able to correct but does not maintain beyond a few minutes. LOB x 1 to R side while in long-sitting.  Dynamic Sitting Balance Dynamic Sitting - Balance Support: Bilateral upper extremity supported Dynamic Sitting - Level of Assistance: 4: Min assist Dynamic Sitting - Comments: Assist to stabiize while transitioning from long-sitting<>sitting EOB. Intermittent instability noted with lateral weight-shifting.   End of Session PT - End of Session Activity Tolerance: Patient tolerated treatment well Patient left: in bed;with call bell/phone within reach;with nursing in room  GP     [name] [name] 08/09/2012, 3:03 PM [phone]

## 2012-08-10 DIAGNOSIS — T827XXA Infection and inflammatory reaction due to other cardiac and vascular devices, implants and grafts, initial encounter: Secondary | ICD-10-CM

## 2012-08-10 LAB — CBC
HCT: 24.9 % — ABNORMAL LOW (ref 39.0–52.0)
Hemoglobin: 7.8 g/dL — ABNORMAL LOW (ref 13.0–17.0)
MCH: 27.4 pg (ref 26.0–34.0)
MCHC: 31.3 g/dL (ref 30.0–36.0)
MCV: 87.4 fL (ref 78.0–100.0)
RBC: 2.85 MIL/uL — ABNORMAL LOW (ref 4.22–5.81)

## 2012-08-10 LAB — BASIC METABOLIC PANEL
BUN: <3 mg/dL — ABNORMAL LOW (ref 6–23)
CO2: 25 mEq/L (ref 19–32)
Calcium: 8.4 mg/dL (ref 8.4–10.5)
GFR calc non Af Amer: >90 mL/min (ref >90)
Glucose, Bld: 74 mg/dL (ref 70–99)
Sodium: 133 mEq/L — ABNORMAL LOW (ref 135–145)

## 2012-08-10 LAB — GENTAMICIN LEVEL, TROUGH: Gentamicin Trough: 1 ug/mL (ref 0.5–2.0)

## 2012-08-10 MED ORDER — SODIUM CHLORIDE 0.9 % IR SOLN
80.0000 mg | Status: DC
  Filled 2012-08-10 (×2): qty 2

## 2012-08-10 MED ORDER — MAGNESIUM OXIDE 400 (241.3 MG) MG PO TABS
400.0000 mg | ORAL_TABLET | Freq: Every day | ORAL | Status: DC
  Administered 2012-08-12 – 2012-08-21 (×8): 400 mg via ORAL
  Filled 2012-08-10 (×12): qty 1

## 2012-08-10 MED ORDER — CHLORHEXIDINE GLUCONATE 4 % EX LIQD
60.0000 mL | Freq: Once | CUTANEOUS | Status: AC
  Administered 2012-08-10: 4 via TOPICAL
  Filled 2012-08-10: qty 60

## 2012-08-10 MED ORDER — POLYETHYLENE GLYCOL 3350 17 G PO PACK
17.0000 g | PACK | Freq: Two times a day (BID) | ORAL | Status: DC
  Administered 2012-08-10: 17 g via ORAL
  Filled 2012-08-10 (×13): qty 1

## 2012-08-10 MED ORDER — CEFAZOLIN SODIUM-DEXTROSE 2-3 GM-% IV SOLR
2.0000 g | INTRAVENOUS | Status: AC
  Administered 2012-08-11: 2 g via INTRAVENOUS
  Filled 2012-08-10: qty 50

## 2012-08-10 MED ORDER — CHLORHEXIDINE GLUCONATE 4 % EX LIQD
60.0000 mL | Freq: Once | CUTANEOUS | Status: DC
  Filled 2012-08-10 (×2): qty 60

## 2012-08-10 MED ORDER — CARVEDILOL 3.125 MG PO TABS
3.1250 mg | ORAL_TABLET | Freq: Two times a day (BID) | ORAL | Status: DC
  Administered 2012-08-10 – 2012-08-20 (×20): 3.125 mg via ORAL
  Filled 2012-08-10 (×28): qty 1

## 2012-08-10 MED ORDER — MAGNESIUM SULFATE 40 MG/ML IJ SOLN
2.0000 g | Freq: Once | INTRAMUSCULAR | Status: AC
  Administered 2012-08-10: 2 g via INTRAVENOUS
  Filled 2012-08-10: qty 50

## 2012-08-10 NOTE — Progress Notes (Signed)
OT Cancellation Note  Patient Details Name: [name] [name] MRN: [id number] DOB: Jul 26, 1976   Cancelled Treatment:    Reason Eval/Treat Not Completed: Other (comment) (just finishing hydro with PT). Will try back at a later time.  Lennox Laity [phone] 08/10/2012, 12:54 PM

## 2012-08-10 NOTE — Progress Notes (Signed)
Subjective: Complaining of some abdominal and back pain. No other specific concerns.  Objective: Vital signs in last 24 hours: Filed Vitals:   08/09/12 0556 08/09/12 1400 08/09/12 2338 08/10/12 0627  BP: 116/70 100/66 100/64 90/52  Pulse: 93 96 95 104  Temp: 98.2 F (36.8 C) 98.4 F (36.9 C) 98.4 F (36.9 C) 98.3 F (36.8 C)  TempSrc: Oral Oral Oral Oral  Resp: 20 12 18 18   Height:      Weight:      SpO2: 100% 99% 98% 100%   Weight change:   Intake/Output Summary (Last 24 hours) at 08/10/12 0859 Last data filed at 08/10/12 4098  Gross per 24 hour  Intake    650 ml  Output   1850 ml  Net  -1200 ml    Physical Exam: General: Awake, Oriented, No acute distress. HEENT: EOMI. Neck: Supple CV: S1 and S2 Lungs: Clear to ascultation bilaterally Abdomen: Soft, Nontender, Nondistended, +bowel sounds. Ostomy in place. Midline staples in place. Ext: Good pulses. Trace edema.  Lab Results: Basic Metabolic Panel:  Lab 08/10/12 1191 08/08/12 0415 08/07/12 0445 08/06/12 0300 08/05/12 0457 08/04/12 0406  NA 133* 134* 136 134* 135 --  K 3.7 4.3 4.3 4.3 4.1 --  CL 101 104 105 104 103 --  CO2 25 25 26 25 25  --  GLUCOSE 74 79 81 79 83 --  BUN <3* 3* 3* 3* 4* --  CREATININE 0.28* 0.31* 0.39* 0.32* 0.31* --  CALCIUM 8.4 8.0* 7.9* 7.8* 8.0* --  MG 1.4* -- -- 1.8 1.5 1.8  PHOS -- -- -- -- -- --   Liver Function Tests: No results found for this basename: AST:5,ALT:5,ALKPHOS:5,BILITOT:5,PROT:5,ALBUMIN:5 in the last 168 hours No results found for this basename: LIPASE:5,AMYLASE:5 in the last 168 hours No results found for this basename: AMMONIA:5 in the last 168 hours CBC:  Lab 08/10/12 0355 08/08/12 0415 08/07/12 0445 08/06/12 0300 08/05/12 0457  WBC 4.6 4.7 5.3 5.8 4.6  NEUTROABS -- -- -- -- --  HGB 7.8* 7.3* 7.3* 7.3* 7.1*  HCT 24.9* 23.1* 23.2* 23.4* 22.6*  MCV 87.4 87.8 89.6 89.0 88.3  PLT 320 307 327 291 246   Cardiac Enzymes: No results found for this basename:  CKTOTAL:5,CKMB:5,CKMBINDEX:5,TROPONINI:5 in the last 168 hours BNP (last 3 results) No results found for this basename: PROBNP:3 in the last 8760 hours CBG: No results found for this basename: GLUCAP:5 in the last 168 hours No results found for this basename: HGBA1C:5 in the last 72 hours Other Labs: No components found with this basename: POCBNP:3 No results found for this basename: DDIMER:2 in the last 168 hours No results found for this basename: CHOL:2,HDL:2,LDLCALC:2,TRIG:2,CHOLHDL:2,LDLDIRECT:2 in the last 168 hours No results found for this basename: TSH,T4TOTAL,FREET3,T3FREE,FREET4,THYROIDAB in the last 168 hours No results found for this basename: VITAMINB12:2,FOLATE:2,FERRITIN:2,TIBC:2,IRON:2,RETICCTPCT:2 in the last 168 hours  Micro Results: No results found for this or any previous visit (from the past 240 hour(s)).  Studies/Results: Dg Abd Portable 1v  08/09/2012  *RADIOLOGY REPORT*  Clinical Data: Status post diverting ostomy 08/01/2012.  PORTABLE ABDOMEN - 1 VIEW  Comparison: CT of 07/28/2012  Findings: 2 supine views.  Midline laparotomy staples.  There is contrast throughout the colon, which is normal in caliber. No free intraperitoneal air.  There is a moderate amount of ascending colonic stool.  No small bowel dilatation.  Distal gas and stool. There is osseous destruction about the left inferior pubic ramus, as detailed on prior CT. Surgical clips project over the  mid abdomen.  IMPRESSION: No bowel obstruction or free intraperitoneal air.  Possible constipation, with a moderate amount stool in the ascending colon.   Original Report Authenticated By: Consuello Bossier, M.D.     Medications: I have reviewed the patient's current medications. Scheduled Meds:    . calcium carbonate (dosed in mg elemental calcium)  1,000 mg of elemental calcium Oral BID   And  . cholecalciferol  400 Units Oral BID  . carvedilol  3.125 mg Oral BID WC  .  ceFAZolin (ANCEF) IV  2 g Intravenous  Q8H  . docusate  100 mg Oral BID  . enoxaparin (LOVENOX) injection  40 mg Subcutaneous Q24H  . feeding supplement  237 mL Oral BID BM  . ferrous gluconate  324 mg Oral BID  . ferumoxytol  1,020 mg Intravenous Once  . gentamicin  80 mg Intravenous Q8H  . magnesium oxide  400 mg Oral BID  . methadone  5 mg Oral Daily  . metroNIDAZOLE  500 mg Oral Q8H  . multivitamin with minerals  1 tablet Oral Daily  . nystatin  5 mL Oral QID  . oxybutynin  5 mg Oral TID  . OxyCODONE  20 mg Oral Q12H  . phosphorus  500 mg Oral TID  . polyethylene glycol  17 g Oral Daily  . protein supplement  8 oz Oral BID  . rifampin  300 mg Oral TID PC  . simvastatin  40 mg Oral QHS  . sodium chloride  10-40 mL Intracatheter Q12H  . vitamin C  500 mg Oral BID  . vitamin E  400 Units Oral Daily  . zinc sulfate  220 mg Oral Daily  . DISCONTD: rifampin  300 mg Oral Q8H   Continuous Infusions:  PRN Meds:.acetaminophen, acetaminophen, alum & mag hydroxide-simeth, diphenhydrAMINE, diphenhydrAMINE, HYDROcodone-acetaminophen, methocarbamol, morphine injection, naloxone, ondansetron (ZOFRAN) IV, ondansetron, oxyCODONE, phenol, promethazine, ranitidine, sodium chloride, sodium chloride  Assessment/Plan: Septic shock secondary to osteomyelitis and MSSA bacteremia/ESBL in the urine, bacteremia likely secondary to decubitus ulcer.  ID following. Patient initially was on vancomycin which was discontinued because of no MRSA isolation. Repeat blood cultures drawn on 07/27/2012 which showed no growth to date. Cardiac echo done on 10/16 did not show any vegetations. Currently finished course of therapy for ESBL in the urine and potentially narrowing per infectious disease input (consult appreciated) by Dr. Ilsa Iha. Patient had TEE on 08/08/2012 which showed large oscillating density on right atrial lead concerning for vegetation, cardiology planning on surgery on 08/11/2012 to remove patient's hardware. IV cefazolin and rifampin for 6  weeks starting 08/11/2012, and gentamycin for 2 weeks, after ICD removal. Currenlty patient on IV cefazolin, IV gentamycin and Flagyl. ID and CCS following and appreciate input and recommendations.  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 Appreciate input into patient's care-Vitamin C and E, Zinc added to meds for wound healing-to aid healing. WOC nurse consulted 10/21. Dr Mahala Menghini spoke with Dr. Kelly Splinter 10/21. Patient will need outpatient followup at the wound care clinic on discharge. In the meanwhile, would recommend per general surgery's input wound vacuum to keep the area clean and to promote healing. Wound care nurse already involved in patient's care. PCA morphine has been started by surgery and was discontinued secondary to decreased respiratory rate. Antibiotics as above. Oxycodone PRN. Continue hydrotherapy. General surgery is following and appreciate input and recommendations.   MSSA bacteremia Repeat blood cultures negative to date.  2-D echo was negative for vegetations. TEE showed right atrial lead concerning for vegetation. IV cefazolin and rifampin for 6 weeks starting 08/11/2012, and gentamycin for 2 weeks, after ICD removal.   Right atrial lead concerning for vegetation on TEE on 08/08/2012 Cardiology planning on taking ICD on 08/11/2012.  UTI secondary to ESBL Completed 7 days of meropenem.  Hypokalemia Repleted  Acute kidney injury Creatinine was 1.91, resolved. NSL IVF.   Hypomagnesemia  Replete as needed.  Hypophosphatemia  Repleted.  Cardiomyopathy with an EF of 25-30% ACE inhibitor not started due to hypotention, may be started with improvement in blood pressure. Started on carvediolol. Records from [place] pending, where patient was incarcerated, patient states that he saw a cardiologist by the name of Dr. Elwyn Reach who recommended this for him. Patient with  borderline BP and as such will not start ACE inhib/beta blocker.   Elevated alkaline phosphatase Probably secondary to bone remodeling.   Likely Anemia of Chronic disease/Iron deficiency anemia Anemia panel 10/15. Transfusion threshold is below 7.0. Given serum Iron is 15. Patient given a dose of Feraheme IV on 08/09/2012. Follow CBC. Continue ferrous gluconate.  Poor nutritional status Albumin on Cmet was 0.9. Prealbumin 5.3. Once tolerating PO will need nuitrition consult.   Surgical absence of R kidney Potentially may need Aranesp-would get dosing from either hematology or oncology or nephrology   T10 paraplegia Continue PT/OT.  Potentially might benefit from inpatient rehabilitation   Chronic back pain Patient on long-acting OxyContin 20 mg by mouth twice a day. Patient weaned off methadone. On vicodin, oxycodone, and IV morphine prn. Continue robaxin.   Prophylaxis  Lovenox for DVT prophylaxis   Code Status: Full  Family Communication: Updated patient at bedside. No family present.  Disposition Plan: SNF   Consultants:  Gen surgery : Dr Michaell Cowing 07/25/12  Plastic Surgery: Lazaro Arms, PA 07/31/12  Infectious disease Dr Drue Second 07/27/12  Cardiology: Dr Tenny Craw  PCCM : Dr Kendrick Fries 07/25/12  Procedures:  Peripheral (refused CVL x 3)  Dopamine infusion discontinued 07/28/2012  PICC placed 10/20  CT abdomen and pelvis 07/28/2012  CT of the L-spine 07/25/2012  Chest x-ray 07/25/2012  2-D echo 07/26/2012  Arthrocentesis of left hip joint effusion 07/30/2012  (Diverting colostomy/left inguinal hernia repair/wound debridement) per Dr. Carolynne Edouard and Donell Beers 08/01/2012  TEE 08/08/2012  Cultures / Sepsis markers:  10/15 blood >>gpc>>1/2 sa res to pcn but oxacillin ss  10/15 urine >>ESBL  10/15 wound>>gpc>>SA>>pan ss  10/17 Blood >>neg till date  10/20 Hip aspirate cultures negative at present   Antibiotics: per ID  10/15 vanc (sacral ulcer) >>10/18  10/15 zosyn (sacral ulcer)(gnr  urine) >> 10/18  10/18 Meropenem >> 10/24  10/24 Cefazolin 10/24 Metonidazole 10/29 Gentamycin   LOS: 16 days  Loveda Colaizzi A, MD 08/10/2012, 8:59 AM

## 2012-08-10 NOTE — Progress Notes (Addendum)
Physical therapy-Hydro treatment. 08/10/12  [id number]  Subjective Assessment  Subjective How does my wound look  Wound 07/27/12 Sacrum Posterior;Lower pressure ulcer with bone exposed  Date First Assessed/Time First Assessed: 07/27/12 1130   Location: Sacrum  Location Orientation: Posterior;Lower  Wound Description (Comments): pressure ulcer with bone exposed  Present on Admission: Yes  Site / Wound Assessment Dusky;Granulation tissue;Pale;Pink;Yellow  % Wound base Red or Granulating 70%  % Wound base Yellow 25%  % Wound base Other (Comment) 5%  Peri-wound Assessment Maceration  Margins Unattacted edges (unapproximated) (necrotic is on border/upper margins of wound at 9-12:00, )  Drainage Amount Copious  Drainage Description Green;Purulent (pseudomonas)  Treatment Hydrotherapy (Pulse lavage);Packing (Saline gauze)  Dressing Type ABD;Moist to dry  Dressing Changed Changed  Dressing Status Old drainage  Hydrotherapy  Pulsed Lavage with Suction (psi) 12 psi  Pulsed Lavage with Suction - Normal Saline Used 1000 mL  Pulsed Lavage Tip Tip with splash shield  Pulsed lavage therapy - wound location sacrum, posterior thighs, perineum  Selective Debridement  Selective Debridement - Location (performed by CCS MD at bedside today)  Wound Therapy - Assess/Plan/Recommendations  Wound Therapy - Clinical Statement having pain in abdomen and LE spasms.. was premedicated. Has spasms of L thigh in side lying. Takes extra time for treatment due to pt. Requires time to position self and work through pain of abdomen.  Wound Therapy - Frequency 6X / week  Wound Therapy - Follow Up Recommendations Skilled nursing facility  Wound Plan follow x 2 weeks. for PLS and dressing changes.  Wound Therapy Goals - Improve the function of patient's integumentary system by progressing the wound(s) through the phases of wound healing by:  Decrease Necrotic Tissue to 25  Decrease Necrotic Tissue - Progress Progressing  toward goal  Increase Granulation Tissue to 75  Increase Granulation Tissue - Progress Progressing toward goal  Patient/Family will be able to  reposition self in bed with min assist.  Patient/Family Instruction Goal - Progress Progressing toward goal  Time For Goal Achievement 2 weeks  Wound Therapy - Potential for Goals Fair   Illyria Sobocinski PT [phone]
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
## 2012-08-10 NOTE — Progress Notes (Signed)
   Subjective:  Complains of back pain; no chest pain or dyspnea   Objective:  Filed Vitals:   08/09/12 0556 08/09/12 1400 08/09/12 2338 08/10/12 0627  BP: 116/70 100/66 100/64 90/52  Pulse: 93 96 95 104  Temp: 98.2 F (36.8 C) 98.4 F (36.9 C) 98.4 F (36.9 C) 98.3 F (36.8 C)  TempSrc: Oral Oral Oral Oral  Resp: 20 12 18 18   Height:      Weight:      SpO2: 100% 99% 98% 100%    Intake/Output from previous day:  Intake/Output Summary (Last 24 hours) at 08/10/12 7829 Last data filed at 08/10/12 5621  Gross per 24 hour  Intake    650 ml  Output   1850 ml  Net  -1200 ml    Physical Exam: Physical exam: Well-developed well-nourished in no acute distress.  Skin is warm and dry.  HEENT is normal.  Neck is supple.  Chest is clear to auscultation with normal expansion.  Cardiovascular exam is regular rate and rhythm.  Abdominal exam s/p colostomy and recent surgery Extremities show no edema. neuro paraplegia    Lab Results: Basic Metabolic Panel:  Basename 08/10/12 0355 08/08/12 0415  NA 133* 134*  K 3.7 4.3  CL 101 104  CO2 25 25  GLUCOSE 74 79  BUN <3* 3*  CREATININE 0.28* 0.31*  CALCIUM 8.4 8.0*  MG 1.4* --  PHOS -- --   CBC:  Basename 08/10/12 0355 08/08/12 0415  WBC 4.6 4.7  NEUTROABS -- --  HGB 7.8* 7.3*  HCT 24.9* 23.1*  MCV 87.4 87.8  PLT 320 307     Assessment/Plan:  1 infected ICD lead-plan continue antibiotics. For lead/device extraction tomorrow. 2 bacteremia/osteomyelitis-continue antibiotics as outlined by infectious disease. 3 ischemic cardiomyopathy-plan to continue statin. His blood pressure is borderline. We'll add low-dose carvedilol at 3.125 mg by mouth twice a day to see if he will tolerate. I will add an ACE inhibitor later if his blood pressure allows. He needs to be on an aspirin following his procedure (patient had significant spontaneous contrast in the LV apex on his recent TEE; may need to consider long term  anticoagulation). Would recommend a nuclear study as an outpatient once he recovers from present illness. 4 status post abdominal surgery/colostomy-per primary service.  [name] 08/10/2012, 7:22 AM
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
## 2012-08-11 ENCOUNTER — Inpatient Hospital Stay (HOSPITAL_COMMUNITY): Payer: Medicaid Other | Admitting: Certified Registered"

## 2012-08-11 ENCOUNTER — Encounter (HOSPITAL_COMMUNITY)
Admission: EM | Disposition: A | Discharge status: Skilled Nursing Facility | Payer: Self-pay | Source: Home / Self Care | Attending: Internal Medicine

## 2012-08-11 ENCOUNTER — Inpatient Hospital Stay (HOSPITAL_COMMUNITY): Payer: Medicaid Other

## 2012-08-11 ENCOUNTER — Encounter (HOSPITAL_COMMUNITY): Payer: Self-pay | Admitting: Certified Registered"

## 2012-08-11 DIAGNOSIS — T827XXA Infection and inflammatory reaction due to other cardiac and vascular devices, implants and grafts, initial encounter: Secondary | ICD-10-CM

## 2012-08-11 HISTORY — PX: PACEMAKER LEAD REMOVAL: SHX5064

## 2012-08-11 LAB — CBC
HCT: 25.9 % — ABNORMAL LOW (ref 39.0–52.0)
Hemoglobin: 5.8 g/dL — CL (ref 13.0–17.0)
MCH: 27.4 pg (ref 26.0–34.0)
MCHC: 32 g/dL (ref 30.0–36.0)
Platelets: 420 10*3/uL — ABNORMAL HIGH (ref 150–400)
RBC: 2.12 MIL/uL — ABNORMAL LOW (ref 4.22–5.81)
RDW: 17.8 % — ABNORMAL HIGH (ref 11.5–15.5)
WBC: 8.4 10*3/uL (ref 4.0–10.5)

## 2012-08-11 LAB — BASIC METABOLIC PANEL
BUN: 3 mg/dL — ABNORMAL LOW (ref 6–23)
Creatinine, Ser: 0.31 mg/dL — ABNORMAL LOW (ref 0.50–1.35)
GFR calc Af Amer: >90 mL/min (ref >90)
GFR calc non Af Amer: >90 mL/min (ref >90)
Potassium: 3.6 mEq/L (ref 3.5–5.1)

## 2012-08-11 LAB — ABO/RH: ABO/RH(D): O POS

## 2012-08-11 LAB — CREATININE, SERUM
Creatinine, Ser: 0.28 mg/dL — ABNORMAL LOW (ref 0.50–1.35)
GFR calc Af Amer: >90 mL/min (ref >90)
GFR calc non Af Amer: >90 mL/min (ref >90)

## 2012-08-11 LAB — PROTIME-INR: Prothrombin Time: 17.6 seconds — ABNORMAL HIGH (ref 11.6–15.2)

## 2012-08-11 LAB — PREPARE RBC (CROSSMATCH)

## 2012-08-11 SURGERY — REMOVAL, ELECTRODE LEAD, CARDIAC PACEMAKER, WITHOUT REPLACEMENT
Anesthesia: General | Site: Chest | Laterality: Left | Wound class: Dirty or Infected

## 2012-08-11 MED ORDER — ALTEPLASE 2 MG IJ SOLR
2.0000 mg | Freq: Once | INTRAMUSCULAR | Status: AC
  Administered 2012-08-11: 2 mg
  Filled 2012-08-11: qty 2

## 2012-08-11 MED ORDER — ACETAMINOPHEN 325 MG PO TABS
325.0000 mg | ORAL_TABLET | ORAL | Status: DC | PRN
  Administered 2012-08-21: 325 mg via ORAL

## 2012-08-11 MED ORDER — ONDANSETRON HCL 4 MG/2ML IJ SOLN: 4.0000 mg | Freq: Once | INTRAMUSCULAR | Status: DC | PRN

## 2012-08-11 MED ORDER — OXYCODONE HCL 5 MG/5ML PO SOLN: 5.0000 mg | Freq: Once | ORAL | Status: DC | PRN

## 2012-08-11 MED ORDER — LACTATED RINGERS IV SOLN
INTRAVENOUS | Status: DC | PRN
  Administered 2012-08-11: 12:00:00 via INTRAVENOUS

## 2012-08-11 MED ORDER — HYDROMORPHONE HCL PF 1 MG/ML IJ SOLN
0.2500 mg | INTRAMUSCULAR | Status: DC | PRN
  Administered 2012-08-11 (×4): 0.5 mg via INTRAVENOUS

## 2012-08-11 MED ORDER — FENTANYL CITRATE 0.05 MG/ML IJ SOLN
INTRAMUSCULAR | Status: DC | PRN
  Administered 2012-08-11 (×2): 50 ug via INTRAVENOUS
  Administered 2012-08-11: 25 ug via INTRAVENOUS
  Administered 2012-08-11: 50 ug via INTRAVENOUS

## 2012-08-11 MED ORDER — LIDOCAINE HCL (PF) 1 % IJ SOLN
INTRAMUSCULAR | Status: DC | PRN
  Administered 2012-08-11 (×2): 30 mL

## 2012-08-11 MED ORDER — OXYCODONE HCL 5 MG PO TABS: 5.0000 mg | ORAL_TABLET | Freq: Once | ORAL | Status: DC | PRN

## 2012-08-11 MED ORDER — OXYCODONE HCL 5 MG PO TABS
5.0000 mg | ORAL_TABLET | Freq: Once | ORAL | Status: AC
  Administered 2012-08-11: 5 mg via ORAL
  Filled 2012-08-11: qty 1

## 2012-08-11 MED ORDER — ENOXAPARIN SODIUM 40 MG/0.4ML ~~LOC~~ SOLN
40.0000 mg | SUBCUTANEOUS | Status: DC
  Administered 2012-08-12 – 2012-08-21 (×8): 40 mg via SUBCUTANEOUS
  Filled 2012-08-11 (×11): qty 0.4

## 2012-08-11 MED ORDER — PHENYLEPHRINE HCL 10 MG/ML IJ SOLN
INTRAMUSCULAR | Status: DC | PRN
  Administered 2012-08-11 (×2): 40 ug via INTRAVENOUS
  Administered 2012-08-11: 80 ug via INTRAVENOUS
  Administered 2012-08-11 (×2): 40 ug via INTRAVENOUS

## 2012-08-11 MED ORDER — CEFAZOLIN SODIUM-DEXTROSE 2-3 GM-% IV SOLR
INTRAVENOUS | Status: AC
  Filled 2012-08-11: qty 50

## 2012-08-11 MED ORDER — LIDOCAINE HCL (PF) 1 % IJ SOLN
INTRAMUSCULAR | Status: AC
  Filled 2012-08-11: qty 30

## 2012-08-11 MED ORDER — MEPERIDINE HCL 25 MG/ML IJ SOLN: 6.2500 mg | INTRAMUSCULAR | Status: DC | PRN

## 2012-08-11 MED ORDER — SODIUM CHLORIDE 0.9 % IR SOLN
Status: DC | PRN
  Administered 2012-08-11: 13:00:00

## 2012-08-11 MED ORDER — PROPOFOL 10 MG/ML IV BOLUS
INTRAVENOUS | Status: DC | PRN
  Administered 2012-08-11 (×3): 20 mg via INTRAVENOUS

## 2012-08-11 MED ORDER — ONDANSETRON HCL 4 MG/2ML IJ SOLN
4.0000 mg | Freq: Four times a day (QID) | INTRAMUSCULAR | Status: DC | PRN
  Administered 2012-08-11 – 2012-08-12 (×3): 4 mg via INTRAVENOUS
  Filled 2012-08-11 (×2): qty 2

## 2012-08-11 MED ORDER — MIDAZOLAM HCL 5 MG/5ML IJ SOLN
INTRAMUSCULAR | Status: DC | PRN
  Administered 2012-08-11 (×2): 1 mg via INTRAVENOUS

## 2012-08-11 MED ORDER — HYDROMORPHONE HCL PF 1 MG/ML IJ SOLN
INTRAMUSCULAR | Status: AC
  Filled 2012-08-11: qty 1

## 2012-08-11 MED ORDER — PROPOFOL INFUSION 10 MG/ML OPTIME
INTRAVENOUS | Status: DC | PRN
  Administered 2012-08-11: 140 ug/kg/min via INTRAVENOUS

## 2012-08-11 MED ORDER — CEFAZOLIN SODIUM-DEXTROSE 2-3 GM-% IV SOLR
2.0000 g | Freq: Three times a day (TID) | INTRAVENOUS | Status: AC
  Administered 2012-08-11 – 2012-08-12 (×3): 2 g via INTRAVENOUS
  Filled 2012-08-11 (×4): qty 50

## 2012-08-11 MED ORDER — MINERAL OIL RE ENEM
1.0000 | ENEMA | Freq: Once | RECTAL | Status: DC
  Filled 2012-08-11: qty 1

## 2012-08-11 SURGICAL SUPPLY — 34 items
CANISTER SUCTION 2500CC (MISCELLANEOUS) ×2 IMPLANT
CLOTH BEACON ORANGE TIMEOUT ST (SAFETY) ×2 IMPLANT
DRAPE C-ARM 42X72 X-RAY (DRAPES) ×2 IMPLANT
DRAPE CARDIOVASCULAR INCISE (DRAPES) ×2
DRAPE INCISE IOBAN 66X45 STRL (DRAPES) ×3 IMPLANT
DRAPE PROXIMA HALF (DRAPES) ×2 IMPLANT
DRAPE SRG 135X102X78XABS (DRAPES) ×1 IMPLANT
DRSG OPSITE 11X17.75 LRG (GAUZE/BANDAGES/DRESSINGS) ×1 IMPLANT
DRSG OPSITE 6X11 MED (GAUZE/BANDAGES/DRESSINGS) IMPLANT
ELECT REM PT RETURN 9FT ADLT (ELECTROSURGICAL) ×2
ELECTRODE REM PT RTRN 9FT ADLT (ELECTROSURGICAL) ×1 IMPLANT
GAUZE SPONGE 4X4 16PLY XRAY LF (GAUZE/BANDAGES/DRESSINGS) ×2 IMPLANT
GLOVE BIO SURGEON STRL SZ 6 (GLOVE) ×2 IMPLANT
GLOVE BIO SURGEON STRL SZ8 (GLOVE) ×2 IMPLANT
GLOVE BIOGEL PI IND STRL 6 (GLOVE) IMPLANT
GLOVE BIOGEL PI IND STRL 7.5 (GLOVE) ×1 IMPLANT
GLOVE BIOGEL PI INDICATOR 6 (GLOVE) ×4
GLOVE BIOGEL PI INDICATOR 7.5 (GLOVE) ×1
GOWN PREVENTION PLUS XLARGE (GOWN DISPOSABLE) ×2 IMPLANT
GOWN STRL NON-REIN LRG LVL3 (GOWN DISPOSABLE) ×5 IMPLANT
KIT ROOM TURNOVER OR (KITS) ×2 IMPLANT
PACK CHEST (CUSTOM PROCEDURE TRAY) ×2 IMPLANT
PAD ARMBOARD 7.5X6 YLW CONV (MISCELLANEOUS) ×4 IMPLANT
PENCIL BUTTON HOLSTER BLD 10FT (ELECTRODE) IMPLANT
SPONGE GAUZE 4X4 12PLY (GAUZE/BANDAGES/DRESSINGS) ×2 IMPLANT
SUT PROLENE 2 0 CT2 30 (SUTURE) ×4 IMPLANT
SUT PROLENE 2 0 SH DA (SUTURE) ×2 IMPLANT
SUT SILK 0 FSL (SUTURE) ×2 IMPLANT
SUT VIC AB 2-0 CT2 18 VCP726D (SUTURE) ×1 IMPLANT
SUT VIC AB 3-0 X1 27 (SUTURE) ×1 IMPLANT
TAPE CLOTH SURG 4X10 WHT LF (GAUZE/BANDAGES/DRESSINGS) ×1 IMPLANT
TOWEL OR 17X24 6PK STRL BLUE (TOWEL DISPOSABLE) ×6 IMPLANT
TUBE CONNECTING 12X1/4 (SUCTIONS) ×2 IMPLANT
YANKAUER SUCT BULB TIP NO VENT (SUCTIONS) ×2 IMPLANT

## 2012-08-11 NOTE — Anesthesia Preprocedure Evaluation (Addendum)
Anesthesia Evaluation  Patient identified by MRN, date of birth, ID band Patient awake    Airway Mallampati: I TM Distance: >3 FB Neck ROM: Full    Dental  (+) Teeth Intact and Dental Advisory Given   Pulmonary          Cardiovascular + Cardiac Defibrillator     Neuro/Psych  Neuromuscular disease    GI/Hepatic   Endo/Other    Renal/GU Renal disease     Musculoskeletal   Abdominal   Peds  Hematology   Anesthesia Other Findings   Reproductive/Obstetrics                           Anesthesia Physical Anesthesia Plan  ASA: III  Anesthesia Plan: MAC   Post-op Pain Management:    Induction: Intravenous  Airway Management Planned: Natural Airway  Additional Equipment: Arterial line  Intra-op Plan:   Post-operative Plan:   Informed Consent: I have reviewed the patients History and Physical, chart, labs and discussed the procedure including the risks, benefits and alternatives for the proposed anesthesia with the patient or authorized representative who has indicated his/her understanding and acceptance.     Plan Discussed with: CRNA and Surgeon  Anesthesia Plan Comments:         Anesthesia Quick Evaluation
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
## 2012-08-11 NOTE — Progress Notes (Signed)
Coccyx dressing dry and intact and smells

## 2012-08-11 NOTE — Progress Notes (Signed)
Patient bathing himself, complaining of leakage around catheter, Changed out Cath to larger 72fr.  Patient resting.

## 2012-08-11 NOTE — Progress Notes (Signed)
Multiple pressure ulcers over legs and heels

## 2012-08-11 NOTE — Progress Notes (Signed)
Entered room found patient in excruciating pain, 10 (1-10), gave Vicodin 2 tabs and  Oxy IR 10mg  at 0600 with no releif, paged MD

## 2012-08-11 NOTE — Progress Notes (Signed)
Pt complaint of pain at leftradial area, dressing is present appears to have been an attempt at an arterial line. very painful to patient

## 2012-08-11 NOTE — Op Note (Signed)
ICD extraction via the left subclavian vein without immediate complication. [id number].

## 2012-08-11 NOTE — Progress Notes (Signed)
08/11/12 1139  PT Visit Information  Last PT Received On 08/11/12  Reason Eval/Treat Not Completed Pain limiting ability to participate (pt in 10/10 pain, unable to  to perform mobility or hydrotherapy this am; RN unwilling to give meds until 10:30 prior to procedure at [facility])  PT Time Calculation  PT Start Time [phone]

## 2012-08-11 NOTE — Progress Notes (Signed)
eLink Physician-Brief Progress Note Patient Name: [name] [name] DOB: 04-22-1976 MRN: [id number]  Date of Service  08/11/2012   HPI/Events of Note   Anemia severe Hgb 5.8  ?etiology  eICU Interventions  Transfuse two units      Shan Levans 08/11/2012, 8:07 PM
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
## 2012-08-11 NOTE — Transfer of Care (Signed)
Immediate Anesthesia Transfer of Care Note  Patient: [name] [name]  Procedure(s) Performed: Procedure(s) (LRB) with comments: PACEMAKER LEAD REMOVAL (Left)  Patient Location: PACU  Anesthesia Type:MAC  Level of Consciousness: awake, alert  and oriented  Airway & Oxygen Therapy: Patient Spontanous Breathing and Patient connected to face mask oxygen  Post-op Assessment: Report given to PACU RN and Post -op Vital signs reviewed and stable  Post vital signs: Reviewed and stable  Complications: No apparent anesthesia complications

## 2012-08-11 NOTE — Anesthesia Postprocedure Evaluation (Signed)
Anesthesia Post Note  Patient: [name] [name]  Procedure(s) Performed: Procedure(s) (LRB): PACEMAKER LEAD REMOVAL (Left)  Anesthesia type: general  Patient location: PACU  Post pain: Pain level controlled  Post assessment: Patient's Cardiovascular Status Stable  Last Vitals:  Filed Vitals:   08/11/12 1406  BP:   Pulse: 73  Temp:   Resp:     Post vital signs: Reviewed and stable  Level of consciousness: sedated  Complications: No apparent anesthesia complications

## 2012-08-11 NOTE — Interval H&P Note (Signed)
History and Physical Interval Note:  08/11/2012 9:51 AM  [name] [name]  has presented today for surgery, with the diagnosis of infected pacemaker  The various methods of treatment have been discussed with the patient and family. After consideration of risks, benefits and other options for treatment, the patient has consented to  Procedure(s) (LRB) with comments: PACEMAKER LEAD REMOVAL (Left) as a surgical intervention .  The patient's history has been reviewed, patient examined, no change in status, stable for surgery.  I have reviewed the patient's chart and labs.  Questions were answered to the patient's satisfaction.     Leonia Reeves.D.
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
## 2012-08-11 NOTE — Progress Notes (Signed)
Dr. Michelle Piper called regarding arterial line may be d/cd

## 2012-08-11 NOTE — Progress Notes (Signed)
Patient to go to  today to get a procedure. Once patient's procedure is done, patient to be faxed out for snf under letter of guarantee. At this time it is not clear what needs patient will have upon discharge.  Kaelie Henigan C. Dariush Mcnellis MSW, LCSW [phone]

## 2012-08-12 ENCOUNTER — Inpatient Hospital Stay (HOSPITAL_COMMUNITY): Payer: Medicaid Other

## 2012-08-12 LAB — BASIC METABOLIC PANEL
Chloride: 105 mEq/L (ref 96–112)
Creatinine, Ser: 0.3 mg/dL — ABNORMAL LOW (ref 0.50–1.35)
GFR calc Af Amer: >90 mL/min (ref >90)
Sodium: 137 mEq/L (ref 135–145)

## 2012-08-12 LAB — CBC
MCV: 87.2 fL (ref 78.0–100.0)
Platelets: 288 10*3/uL (ref 150–400)
RDW: 16.5 % — ABNORMAL HIGH (ref 11.5–15.5)
WBC: 6.4 10*3/uL (ref 4.0–10.5)

## 2012-08-12 MED ORDER — PROMETHAZINE HCL 25 MG/ML IJ SOLN
12.5000 mg | INTRAMUSCULAR | Status: DC | PRN
  Administered 2012-08-15: 12.5 mg via INTRAVENOUS
  Filled 2012-08-12 (×2): qty 1

## 2012-08-12 MED ORDER — ONDANSETRON HCL 4 MG PO TABS: 4.0000 mg | ORAL_TABLET | ORAL | Status: DC | PRN

## 2012-08-12 MED ORDER — ONDANSETRON HCL 4 MG/2ML IJ SOLN
4.0000 mg | INTRAMUSCULAR | Status: DC | PRN
  Administered 2012-08-13 – 2012-08-18 (×12): 4 mg via INTRAVENOUS
  Filled 2012-08-12 (×13): qty 2

## 2012-08-12 MED ORDER — OXYCODONE HCL 5 MG PO TABS
5.0000 mg | ORAL_TABLET | ORAL | Status: DC | PRN
  Administered 2012-08-12 – 2012-08-21 (×26): 10 mg via ORAL
  Filled 2012-08-12 (×2): qty 2
  Filled 2012-08-12 (×2): qty 1
  Filled 2012-08-12 (×26): qty 2

## 2012-08-12 MED ORDER — CEFAZOLIN SODIUM-DEXTROSE 2-3 GM-% IV SOLR
2.0000 g | Freq: Three times a day (TID) | INTRAVENOUS | Status: DC
  Administered 2012-08-12 – 2012-08-21 (×28): 2 g via INTRAVENOUS
  Filled 2012-08-12 (×34): qty 50

## 2012-08-12 MED ORDER — ZOLPIDEM TARTRATE 5 MG PO TABS
5.0000 mg | ORAL_TABLET | Freq: Every evening | ORAL | Status: DC | PRN
  Administered 2012-08-12: 5 mg via ORAL
  Administered 2012-08-13: 10 mg via ORAL
  Filled 2012-08-12: qty 1
  Filled 2012-08-12: qty 2

## 2012-08-12 NOTE — Op Note (Signed)
NAMECLINE, GRAVATT NO.:  [id number]  MEDICAL RECORD NO.:  [id number]  LOCATION:  2926                         FACILITY:  [facility]  PHYSICIAN:  Doylene Canning. Ladona Ridgel, MD    DATE OF BIRTH:  1976-07-23  DATE OF PROCEDURE:  08/11/2012 DATE OF DISCHARGE:                              OPERATIVE REPORT   PROCEDURE PERFORMED:  Extraction of a dual-chamber defibrillator.  INDICATION:  Defibrillator lead endocarditis.  INTRODUCTION:  The patient is a 36 year old male with history of nonischemic cardiomyopathy and chronic systolic heart failure who underwent ICD implantation at a remote site approximately 2 years ago. The patient has developed decubitus ulcers and bacteremia, presented hospital with fevers and sepsis.  He grew out Staph aureus in his blood. He defervesced.  He was found to have a 1.5 cm vegetation on his defibrillator lead and is now referred for extraction of his system.  PROCEDURE:  After informed consent was obtained, the patient was taken to the operating room in a fasting state.  Anesthesia Service was utilized to apply MAC.  30 mL of lidocaine was infiltrated over the left infraclavicular region.  A 7-cm incision was carried out over this region and electrocautery was utilized to dissect down to the ICD pocket.  It was removed with gentle traction.  The leads were freed up with electrocautery.  The sewing sleeves were freed up as well with electrocautery.  The atrial and defibrillator leads were disconnected from the defibrillator without difficulty.  A 52-cm stylet was advanced into the atrial lead and the helix of the stylet was retracted, which subsequently resulted in removal of the atrial lead with gentle traction.  There were no hemodynamic instability.  At this point, the defibrillator lead was targeted for extraction.  A 65 cm stylet was advanced into the defibrillator lead.  The helix was retracted.  Gentle traction was placed on the lead.  The  lead did not come free.  It appeared to be bound up at the tip of the lead as well as at the proximal coil where it exited the subclavian vein.  The 11-French cook mechanical dissection sheath (RL) was passed over the defibrillation lead after the locking stylet had been locked in place.  The [facility] sheath was activated back and forth resulting in conjunction with gentle traction and removing the defibrillator lead in total.  There were no hemodynamic instabilities.  The cardial silhouette was evaluated and found to be working moving appropriately.  Hemostasis was obtained with gentle pressure.  The pocket was irrigated with copious amounts of antibiotic irrigation.  The incision was closed with 2-0 mattress Prolene suture.  At this point, pressure dressing was applied and the patient was returned to the recovery area where he will be extubated and returned to his room in satisfactory condition.  COMPLICATIONS:  There were no immediate procedure complications.  RESULTS:  This demonstrates successful extraction of an atrial and defibrillator lead as well as the ICD generator in total without immediate procedure complications in a device with a 15 mm vegetation on the defibrillator lead in a patient with staph aureus bacteremia and sepsis.     Doylene Canning. Ladona Ridgel, MD     GWT/MEDQ  D:  08/11/2012  T:  08/12/2012  Job:  [id number]

## 2012-08-12 NOTE — Progress Notes (Signed)
Physical Therapy - Hydrotherapy Note  08/12/12 0915  Subjective Assessment  Subjective "I didn't get hydro yesterday" (Patient in procedure)  Wound 07/27/12 Sacrum Posterior;Lower pressure ulcer with bone exposed  Date First Assessed/Time First Assessed: 07/27/12 1130   Location: Sacrum  Location Orientation: Posterior;Lower  Wound Description (Comments): pressure ulcer with bone exposed  Present on Admission: Yes  Site / Wound Assessment Dusky;Granulation tissue;Pale;Pink;Yellow  % Wound base Red or Granulating 80%  % Wound base Yellow 15%  % Wound base Other (Comment) 5%  Peri-wound Assessment Intact  Margins Unattacted edges (unapproximated)  Closure None  Drainage Amount Copious  Drainage Description Purulent;Green;Odor  Non-staged Wound Description Full thickness  Treatment Cleansed;Hydrotherapy (Pulse lavage);Packing (Saline gauze)  Dressing Type Moist to dry;ABD  Dressing Changed Changed  Hydrotherapy  Pulsed Lavage with Suction (psi) 12 psi  Pulsed Lavage with Suction - Normal Saline Used 1000 mL  Pulsed Lavage Tip Tip with splash shield  Pulsed lavage therapy - wound location sacrum, posterior thighs, perineum  Wound Therapy - Assess/Plan/Recommendations  Wound Therapy - Clinical Statement Patient with increased abdominal pain and nausea today.  Able to tolerate wound pulsatile lavage and dressing change.  Wound continues to have large amounts of purulent drainage. Would benefit from absorptive dressing.  Will continue.  Wound Therapy - Frequency 6X / week  Wound Therapy - Follow Up Recommendations Skilled nursing facility (Would patient benefit from [facility])  Wound Therapy Goals - Improve the function of patient's integumentary system by progressing the wound(s) through the phases of wound healing by:  Decrease Necrotic Tissue - Progress Progressing toward goal  Increase Granulation Tissue - Progress Progressing toward goal   Durenda Hurt. Renaldo Fiddler, [facility] Acute Rehab  Services Pager [phone]

## 2012-08-12 NOTE — Progress Notes (Signed)
Pt is doing well from a cardiac standpoint.  His would was not completely closed - presumably due to necrosis and infection.  Has MRSA in his decubitus and on the pacer lead.  Re applied Tegaderm.  Dr. Ladona Ridgel to check the would on Monday. No arrhythmias, No further cardiology recs.   [name] [name], [name] Hageman., MD, [facility] 08/12/2012, 9:47 AM Office - [phone] Pager [phone]
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
## 2012-08-13 DIAGNOSIS — M869 Osteomyelitis, unspecified: Secondary | ICD-10-CM

## 2012-08-13 LAB — URINALYSIS, ROUTINE W REFLEX MICROSCOPIC
Bilirubin Urine: NEGATIVE
Glucose, UA: NEGATIVE mg/dL
Hgb urine dipstick: NEGATIVE
Ketones, ur: NEGATIVE mg/dL
Nitrite: NEGATIVE
pH: 7 (ref 5.0–8.0)

## 2012-08-13 LAB — TYPE AND SCREEN: Antibody Screen: NEGATIVE

## 2012-08-13 LAB — CBC
MCH: 28 pg (ref 26.0–34.0)
MCV: 87.3 fL (ref 78.0–100.0)
Platelets: 286 10*3/uL (ref 150–400)
RBC: 3.53 MIL/uL — ABNORMAL LOW (ref 4.22–5.81)

## 2012-08-13 LAB — URINE MICROSCOPIC-ADD ON

## 2012-08-13 LAB — BODY FLUID CULTURE: Gram Stain: NONE SEEN

## 2012-08-13 MED ORDER — SODIUM CHLORIDE 0.9 % IV SOLN
300.0000 mg | Freq: Three times a day (TID) | INTRAVENOUS | Status: DC
  Administered 2012-08-13 – 2012-08-14 (×3): 300 mg via INTRAVENOUS
  Filled 2012-08-13 (×6): qty 300

## 2012-08-13 MED ORDER — HYDROMORPHONE HCL PF 1 MG/ML IJ SOLN
2.0000 mg | INTRAMUSCULAR | Status: DC | PRN
  Administered 2012-08-13 – 2012-08-14 (×5): 3 mg via INTRAVENOUS
  Administered 2012-08-14: 2 mg via INTRAVENOUS
  Administered 2012-08-14 (×2): 3 mg via INTRAVENOUS
  Administered 2012-08-14 – 2012-08-16 (×11): 2 mg via INTRAVENOUS
  Administered 2012-08-16 (×4): 3 mg via INTRAVENOUS
  Administered 2012-08-16: 2 mg via INTRAVENOUS
  Administered 2012-08-16: 3 mg via INTRAVENOUS
  Administered 2012-08-16: 2 mg via INTRAVENOUS
  Administered 2012-08-16: 3 mg via INTRAVENOUS
  Administered 2012-08-16 – 2012-08-17 (×2): 2 mg via INTRAVENOUS
  Administered 2012-08-17: 1 mg via INTRAVENOUS
  Administered 2012-08-17 (×8): 2 mg via INTRAVENOUS
  Administered 2012-08-18 (×6): 3 mg via INTRAVENOUS
  Administered 2012-08-18: 2 mg via INTRAVENOUS
  Administered 2012-08-18 – 2012-08-21 (×22): 3 mg via INTRAVENOUS
  Filled 2012-08-13 (×3): qty 3
  Filled 2012-08-13: qty 2
  Filled 2012-08-13: qty 3
  Filled 2012-08-13: qty 1
  Filled 2012-08-13: qty 3
  Filled 2012-08-13 (×2): qty 2
  Filled 2012-08-13 (×4): qty 3
  Filled 2012-08-13: qty 2
  Filled 2012-08-13: qty 3
  Filled 2012-08-13: qty 2
  Filled 2012-08-13 (×2): qty 3
  Filled 2012-08-13: qty 2
  Filled 2012-08-13 (×2): qty 3
  Filled 2012-08-13 (×2): qty 2
  Filled 2012-08-13: qty 3
  Filled 2012-08-13: qty 2
  Filled 2012-08-13 (×2): qty 3
  Filled 2012-08-13 (×2): qty 2
  Filled 2012-08-13 (×2): qty 3
  Filled 2012-08-13 (×2): qty 2
  Filled 2012-08-13: qty 3
  Filled 2012-08-13: qty 2
  Filled 2012-08-13 (×2): qty 3
  Filled 2012-08-13: qty 1
  Filled 2012-08-13 (×2): qty 3
  Filled 2012-08-13: qty 2
  Filled 2012-08-13 (×6): qty 3
  Filled 2012-08-13: qty 2
  Filled 2012-08-13 (×2): qty 3
  Filled 2012-08-13: qty 2
  Filled 2012-08-13: qty 3
  Filled 2012-08-13 (×3): qty 2
  Filled 2012-08-13: qty 3
  Filled 2012-08-13 (×3): qty 2
  Filled 2012-08-13 (×4): qty 3
  Filled 2012-08-13 (×4): qty 2
  Filled 2012-08-13 (×2): qty 3
  Filled 2012-08-13: qty 2
  Filled 2012-08-13 (×2): qty 3

## 2012-08-13 MED ORDER — HYDROMORPHONE HCL PF 1 MG/ML IJ SOLN
1.0000 mg | INTRAMUSCULAR | Status: DC | PRN
  Administered 2012-08-13 (×2): 1.5 mg via INTRAVENOUS
  Filled 2012-08-13: qty 1
  Filled 2012-08-13: qty 2

## 2012-08-13 MED ORDER — OXYCODONE HCL ER 40 MG PO T12A
40.0000 mg | EXTENDED_RELEASE_TABLET | Freq: Two times a day (BID) | ORAL | Status: DC
  Administered 2012-08-13 – 2012-08-15 (×4): 40 mg via ORAL
  Filled 2012-08-13 (×2): qty 4
  Filled 2012-08-13: qty 1
  Filled 2012-08-13: qty 4
  Filled 2012-08-13: qty 1

## 2012-08-13 MED ORDER — METRONIDAZOLE IN NACL 5-0.79 MG/ML-% IV SOLN
500.0000 mg | Freq: Three times a day (TID) | INTRAVENOUS | Status: DC
  Administered 2012-08-13 – 2012-08-14 (×3): 500 mg via INTRAVENOUS
  Filled 2012-08-13 (×7): qty 100

## 2012-08-13 MED ORDER — HYDROMORPHONE HCL PF 1 MG/ML IJ SOLN
INTRAMUSCULAR | Status: AC
  Filled 2012-08-13: qty 2

## 2012-08-13 NOTE — Progress Notes (Signed)
Pt is doing well from a cardiac standpoint.  His would was not completely closed - presumably due to necrosis and infection.  Has MRSA in his decubitus and on the pacer lead.  Bandage is clean and dry.  Wound is slightly tender.  Dr. Ladona Ridgel to check the would on Monday. No arrhythmias, No further cardiology recs.   Vesta Mixer, Montez Hageman., MD, [facility] 08/13/2012, 8:27 AM Office - [phone] Pager [phone]
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
## 2012-08-13 NOTE — Progress Notes (Signed)
Patient examined and I agree with the assessment and plan  Violeta Gelinas, MD, MPH, FACS Pager: [phone]  08/13/2012 1:01 PM

## 2012-08-13 NOTE — Progress Notes (Signed)
Patient ID: [name] [name], male   DOB: [birth year], 36 y.o.   MRN: [id number] 2 Days Post-Op  Subjective: Pt c/o no pain control, appears uncomfortable, tolerating diet but very poor appetite and some nausea due to small of his wounds  Objective: Vital signs in last 24 hours: Temp:  [97.9 F (36.6 C)-99.8 F (37.7 C)] 98.2 F (36.8 C) (11/03 0307) Pulse Rate:  [31-122] 94  (11/03 0307) Resp:  [16] 16  (11/03 0307) BP: (102-108)/(60-72) 102/72 mmHg (11/03 0307) SpO2:  [91 %-100 %] 100 % (11/03 0307) Last BM Date: 08/12/12  Intake/Output from previous day: 11/02 0701 - 11/03 0700 In: 410 [P.O.:360; IV Piggyback:50] Out: 1660 [Urine:1600; Stool:60] Intake/Output this shift:    PE: Abd: soft, overly tender to even light touch, ostomy pink with good output, staples in midline and healing well Ext: dressings in place over multiple lower extremity wounds, sacrum dressing in place(did not remove as patient just had changed)  Lab Results:   Basename 08/13/12 0314 08/12/12 0537  WBC 5.9 6.4  HGB 9.9* 10.1*  HCT 30.8* 31.2*  PLT 286 288   BMET  Basename 08/12/12 0537 08/11/12 1926 08/11/12 0305  NA 137 -- 134*  K 3.7 -- 3.6  CL 105 -- 101  CO2 25 -- 25  GLUCOSE 84 -- 92  BUN 3* -- 3*  CREATININE 0.30* 0.28* --  CALCIUM 8.2* -- 8.5   PT/INR  Basename 08/11/12 0305  LABPROT 17.6*  INR 1.49   CMP     Component Value Date/Time   NA 137 08/12/2012 0537   K 3.7 08/12/2012 0537   CL 105 08/12/2012 0537   CO2 25 08/12/2012 0537   GLUCOSE 84 08/12/2012 0537   BUN 3* 08/12/2012 0537   CREATININE 0.30* 08/12/2012 0537   CALCIUM 8.2* 08/12/2012 0537   PROT 5.8* 08/01/2012 0513   ALBUMIN 1.0* 08/01/2012 0513   AST 17 08/01/2012 0513   ALT 10 08/01/2012 0513   ALKPHOS 100 08/01/2012 0513   BILITOT 0.4 08/01/2012 0513   GFRNONAA >90 08/12/2012 0537   GFRAA >90 08/12/2012 0537   Lipase  No results found for this basename: lipase       Studies/Results: Dg Chest 2  View  08/12/2012  *RADIOLOGY REPORT*  Clinical Data: Postop from AICD removal.  CHEST - 2 VIEW  Comparison: 07/25/2012  Findings: Previously seen AICD has now been removed.  A right arm PICC line is seen with tip overlying the superior cavoatrial junction.  Both lungs are clear.  No pneumothorax identified.  Heart size is normal.  IMPRESSION: No active cardiopulmonary disease.  Right arm PICC line in appropriate position.   Original Report Authenticated By: [name], M.D.    Dg C-arm 1-60 Min-no Report  08/11/2012  CLINICAL DATA: Pacemaker lead extraction   C-ARM 1-60 MINUTES  Fluoroscopy was utilized by the requesting physician.  No radiographic  interpretation.      Anti-infectives: Anti-infectives     Start     Dose/Rate Route Frequency Ordered Stop   08/13/12 0800   metroNIDAZOLE (FLAGYL) IVPB 500 mg        500 mg 100 mL/hr over 60 Minutes Intravenous Every 8 hours 08/13/12 0735     08/12/12 1500   ceFAZolin (ANCEF) IVPB 2 g/50 mL premix        2 g 100 mL/hr over 30 Minutes Intravenous 3 times per day 08/12/12 1405     08/11/12 2000   ceFAZolin (ANCEF) IVPB 2 g/50 mL  premix        2 g 100 mL/hr over 30 Minutes Intravenous Every 8 hours 08/11/12 1632 08/12/12 1222   08/11/12 1313   gentamycin 80 mg in 0.9% normal saline 250 mL irrigation  Status:  Discontinued          As needed 08/11/12 1314 08/11/12 1400   08/11/12 0600   gentamicin (GARAMYCIN) 80 mg in sodium chloride irrigation 0.9 % 500 mL irrigation  Status:  Discontinued        80 mg Irrigation On call 08/10/12 2109 08/11/12 1628   08/11/12 0600   ceFAZolin (ANCEF) IVPB 2 g/50 mL premix     Comments: Pharmacy to determine dosing based on previous orders      2 g 100 mL/hr over 30 Minutes Intravenous On call 08/10/12 2109 08/11/12 1245   08/09/12 1800   rifampin (RIFADIN) capsule 300 mg        300 mg Oral 3 times daily after meals 08/09/12 1645     08/08/12 1800   gentamicin (GARAMYCIN) IVPB 80 mg        80 mg 100  mL/hr over 30 Minutes Intravenous Every 8 hours 08/08/12 1552     08/08/12 1530   rifampin (RIFADIN) capsule 300 mg  Status:  Discontinued        300 mg Oral 3 times per day 08/08/12 1516 08/09/12 1645   08/03/12 2000   metroNIDAZOLE (FLAGYL) tablet 500 mg  Status:  Discontinued        500 mg Oral 3 times per day 08/03/12 1625 08/13/12 0735   08/03/12 1800   ceFAZolin (ANCEF) IVPB 2 g/50 mL premix  Status:  Discontinued        2 g 100 mL/hr over 30 Minutes Intravenous 3 times per day 08/03/12 1625 08/11/12 1652   08/01/12 1300   meropenem (MERREM) 1 g in sodium chloride 0.9 % 100 mL IVPB  Status:  Discontinued        1 g 200 mL/hr over 30 Minutes Intravenous To Surgery 08/01/12 1039 08/01/12 1246   07/29/12 0100   meropenem (MERREM) 1 g in sodium chloride 0.9 % 100 mL IVPB  Status:  Discontinued        1 g 200 mL/hr over 30 Minutes Intravenous 3 times per day 07/29/12 0050 08/03/12 1625   07/26/12 0500   vancomycin (VANCOCIN) 750 mg in sodium chloride 0.9 % 150 mL IVPB  Status:  Discontinued        750 mg 150 mL/hr over 60 Minutes Intravenous Every 12 hours 07/25/12 1958 07/28/12 1033   07/26/12 0200   piperacillin-tazobactam (ZOSYN) IVPB 3.375 g  Status:  Discontinued        3.375 g 12.5 mL/hr over 240 Minutes Intravenous Every 8 hours 07/25/12 1958 07/29/12 0043   07/25/12 1700   vancomycin (VANCOCIN) IVPB 1000 mg/200 mL premix        1,000 mg 200 mL/hr over 60 Minutes Intravenous  Once 07/25/12 1619 07/25/12 1807   07/25/12 1630   piperacillin-tazobactam (ZOSYN) IVPB 3.375 g  Status:  Discontinued        3.375 g 12.5 mL/hr over 240 Minutes Intravenous  Once 07/25/12 1619 07/25/12 1938           Assessment/Plan 1. POD#12-Open Diverting Colostomy, left inguinal hernia repair, sacral wound debridement: pain control issues, ostomy working well, tolerating diet but poor appetite  --changed morphine to dilaudid  --continue wound care for sacrum and lower extremities  --d/c  staples tomorrow.    LOS: 19 days    [name] [name] 08/13/2012

## 2012-08-13 NOTE — Progress Notes (Signed)
[facility]    Subjective:  Patient had his AICD removed on 11/1, minimal tenderness at removal site. He still is having N/V thought to be related to medication side effect. Pain is under better control. Afebrile.  Antibiotics: gent rifampin cefazolin metro po  Finished 7 d of meropenem on 10/23  Medications:    . calcium carbonate (dosed in mg elemental calcium)  1,000 mg of elemental calcium Oral BID   And  . cholecalciferol  400 Units Oral BID  . carvedilol  3.125 mg Oral BID WC  . [COMPLETED]  ceFAZolin (ANCEF) IV  2 g Intravenous Q8H  .  ceFAZolin (ANCEF) IV  2 g Intravenous Q8H  . docusate  100 mg Oral BID  . enoxaparin (LOVENOX) injection  40 mg Subcutaneous Q24H  . feeding supplement  237 mL Oral BID BM  . ferrous gluconate  324 mg Oral BID  . gentamicin  80 mg Intravenous Q8H  . HYDROmorphone      . magnesium oxide  400 mg Oral Daily  . metronidazole  500 mg Intravenous Q8H  . multivitamin with minerals  1 tablet Oral Daily  . nystatin  5 mL Oral QID  . oxybutynin  5 mg Oral TID  . OxyCODONE  40 mg Oral Q12H  . phosphorus  500 mg Oral TID  . polyethylene glycol  17 g Oral BID  . protein supplement  8 oz Oral BID  . rifampin  300 mg Oral TID PC  . simvastatin  40 mg Oral QHS  . sodium chloride  10-40 mL Intracatheter Q12H  . vitamin C  500 mg Oral BID  . vitamin E  400 Units Oral Daily  . zinc sulfate  220 mg Oral Daily  . [DISCONTINUED] metroNIDAZOLE  500 mg Oral Q8H  . [DISCONTINUED] mineral oil  1 enema Rectal Once  . [DISCONTINUED] OxyCODONE  20 mg Oral Q12H    Objective: Weight change:   Intake/Output Summary (Last 24 hours) at 08/13/12 1213 Last data filed at 08/13/12 0521  Gross per 24 hour  Intake    410 ml  Output   1600 ml  Net  -1190 ml   Blood pressure 110/76, pulse 105, temperature 98.1 F (36.7 C), temperature source Oral, resp. rate 18, height 6' 2.5" (1.892 m), weight 198 lb 10.2 oz (90.1 kg), SpO2  97.00%. Temp:  [98.1 F (36.7 C)-99.8 F (37.7 C)] 98.1 F (36.7 C) (11/03 0800) Pulse Rate:  [31-122] 105  (11/03 0800) Resp:  [16-18] 18  (11/03 0800) BP: (102-110)/(60-76) 110/76 mmHg (11/03 0800) SpO2:  [91 %-100 %] 97 % (11/03 0800)  Physical Exam: General: Alert and awake, oriented x3, not in any acute distress. HEENT: anicteric sclera, pupils reactive to light and accommodation, EOMI CVS regular rate, normal r,  no murmur rubs or gallops,  Chest wall = device pocket site is CDI not warm Chest: clear to auscultation bilaterally, no wheezing, rales or rhonchi Abdomen: soft, ostomy in LLQ, midline incision covered, serous drainage noted on bandage Ext = HE has no sensation below waist.  Large sacral decub -packed, but will need to see at wound change   Lab Results:  [facility] 08/13/12 0314 08/12/12 0537  WBC 5.9 6.4  HGB 9.9* 10.1*  HCT 30.8* 31.2*  PLT 286 288    BMET  Basename 08/12/12 0537 08/11/12 1926 08/11/12 0305  NA 137 -- 134*  K 3.7 -- 3.6  CL 105 -- 101  CO2 25 -- 25  GLUCOSE 84 -- 92  BUN 3* -- 3*  CREATININE 0.30* 0.28* --  CALCIUM 8.2* -- 8.5    Micro Results: 10/15 blood cx 1/2 MSSA(S to oxa, clinda, erythro, rifampin, bactrim, gent, levo, vanco 1) 10/15 wound cx few MSSA but g/s shows gnr, gpc, gpr 10/15 urine cx: ESBL Ecoli 10/17 blood cx: NGTD 10/20 left hip cx (labelled as shoulder, accidentally): NGTD   Assessment/Plan: [name] [name] is a 36 y.o. male with  MSSA bacteremia c/b cardiac device infection/endocarditis and sacral osteomyelitis with chronic decubitus ulcer.  1)  methicillin sensitive staph aureus bacteremia and cardiac device infection/endocarditis :  - continue on cefazolin 2gm IV Q8hr, for another 40 days, end date dec 13th  - will change rifampin 300mg  Q8hr to IV to see if that improves his nausea temporarily, to treat for 40 days . 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 Currently on cefazolin 2gm IV Q 8hr plus oral metronidazole 500mg  TID.   Would recommend to ask plastics recommendation for management of wound for dispo  3) nausea = will change rifampin to IV to see if it is the rifampin that is causing his nausea. Then will schedule it with meals to minimize nausea.and pretreat with anti-emetic  4) extended spectrum beta-lactamase Ecoli in the urine: resolved, treated for 7 days with meropenem,     LOS: 19 days   [name] [name] 08/13/2012, 12:13 PM

## 2012-08-14 ENCOUNTER — Encounter (HOSPITAL_COMMUNITY): Payer: Self-pay | Admitting: Internal Medicine

## 2012-08-14 LAB — TYPE AND SCREEN
ABO/RH(D): O POS
Unit division: 0

## 2012-08-14 MED ORDER — METRONIDAZOLE 500 MG PO TABS
500.0000 mg | ORAL_TABLET | Freq: Three times a day (TID) | ORAL | Status: DC
  Administered 2012-08-14 – 2012-08-20 (×14): 500 mg via ORAL
  Filled 2012-08-14 (×24): qty 1

## 2012-08-14 MED ORDER — SODIUM CHLORIDE 0.9 % IV SOLN
INTRAVENOUS | Status: DC | PRN
  Administered 2012-08-14: 20 mL/h via INTRAVENOUS
  Administered 2012-08-18: 20 mL via INTRAVENOUS
  Administered 2012-08-19 – 2012-08-21 (×3): 20 mL/h via INTRAVENOUS

## 2012-08-14 MED ORDER — ONDANSETRON HCL 4 MG/5ML PO SOLN
4.0000 mg | Freq: Three times a day (TID) | ORAL | Status: DC
  Administered 2012-08-14 – 2012-08-21 (×9): 4 mg via ORAL
  Filled 2012-08-14 (×23): qty 5

## 2012-08-14 NOTE — Progress Notes (Signed)
Pt refused wound care .  Will continue to monitor. Emilie [place] Liter

## 2012-08-14 NOTE — Progress Notes (Signed)
ANTIBIOTIC CONSULT NOTE - FOLLOW UP  Pharmacy Consult for Gentamicin Indication: endocarditis synergy  No Known Allergies  Patient Measurements: Height: 6' 2.5" (189.2 cm) Weight: 198 lb 10.2 oz (90.1 kg) IBW/kg (Calculated) : 83.35   Vital Signs: Temp: 98.7 F (37.1 C) (11/04 1120) Temp src: Oral (11/04 1120) BP: 114/66 mmHg (11/04 1120) Pulse Rate: 98  (11/04 1120) Intake/Output from previous day: 11/03 0701 - 11/04 0700 In: 2372 [P.O.:1620; IV Piggyback:752] Out: 1805 [Urine:1705; Stool:100] Intake/Output from this shift:    Labs:  [facility] 08/13/12 0314 08/12/12 0537 08/11/12 1926  WBC 5.9 6.4 8.4  HGB 9.9* 10.1* 5.8*  PLT 286 288 420*  LABCREA -- -- --  CREATININE -- 0.30* 0.28*   Estimated Creatinine Clearance: 147.7 ml/min (by C-G formula based on Cr of 0.3). No results found for this basename: VANCOTROUGH:2,VANCOPEAK:2,VANCORANDOM:2,GENTTROUGH:2,GENTPEAK:2,GENTRANDOM:2,TOBRATROUGH:2,TOBRAPEAK:2,TOBRARND:2,AMIKACINPEAK:2,AMIKACINTROU:2,AMIKACIN:2, in the last 72 hours   Assessment: 27 YOM with MSSA bacteremia with cardiac device infection/endocarditis and sacral osteomyelitis with chronic decubitus ulcer. Pt also finished 7 day course of meropenem for treatment of ESBL E.coli UTI. He is s/p ICD removal 08/11/12. Noted ID MD recommends continuing Ancef+Rif through 12/13 and Gent trough 11/14. Flagyl continues for decub ulcer. Pt remains afebrile and WBC is nml. SCr remains stable, UOP is good. Last Gent peak 3.5 and trough 1 mcg/ml- both at goal on current dose.  10/15 >> Vanc >> 10/18 10/15 >> Zosyn >> 07/1809/19 >> Meropenem >> 10/24 10/24 >> Ancef >>  10/24 >> Flagyl >> 10/29 >> Gent (synergy) >> 10/29 >> rifampin >>  10/15 MRSA PCR: positive 10/15 Blood: 1/2 MSSA 10/15 Sacral wound: MSSA 10/15 Urine: E.coli +ESBL - sens to imipenem, nitro, Zosyn. 10/17 blood: ngtd (final) 10/20 shoulder joint aspirate (labeled incorrectly, is actually L hip):  ngtd  Goal of Therapy:  Gentamicin peak level 3-4 mcg/ml and trough <1 mcg/ml  Plan:  - Continue Gent at current dose - Will plan to recheck Gent peak and trough on Friday 11/8- will check levels sooner if renal function declines - Will f/up clinical status along with you daily  Thanks, Tyre Beaver K. Allena Katz, PharmD, BCPS.  Clinical Pharmacist Pager [phone]. 08/14/2012 1:55 PM

## 2012-08-14 NOTE — Progress Notes (Signed)
Nutrition Follow-up/consult   Intervention:   1. Continue Unjury soup BID 2. Encouraged protein intake post d/c 3. D/c Ensure, pt will not drink 4. RD will continue to follow    Assessment:   Pt states he has not eaten in 4 days. Lunch at bedside, has not been touched. Per documentation, pt has been eating about 10% of meals.  S/p removal of ICD on 11/1.   RD consulted for pt with pressure ulcer to sacrum. Pt being followed by RD at [facility] since 10/16.  Pt was started on Ensure and [employer]. Pt was unable to tolerate Ensure r/t gas and bloating.  Current supplements include Unjury Chicken Soup, Ensure Complete BID.  Pt was previously educated on importance of high protein foods. RD went over this again with pt and his mother. Pt restricts some high protein foods r/t his ostomy. No milk products or beans. Encouraged eggs and meat products at all meals, and high protein snacks.   Pt likes the Unjury soup, encouraged pt to continue once he is d/c'd. Will not drink Ensure or Pro-stat.   Diet Order:  Regular PO intake: 10%  Meds: Scheduled Meds:   . calcium carbonate (dosed in mg elemental calcium)  1,000 mg of elemental calcium Oral BID   And  . cholecalciferol  400 Units Oral BID  . carvedilol  3.125 mg Oral BID WC  .  ceFAZolin (ANCEF) IV  2 g Intravenous Q8H  . docusate  100 mg Oral BID  . enoxaparin (LOVENOX) injection  40 mg Subcutaneous Q24H  . feeding supplement  237 mL Oral BID BM  . ferrous gluconate  324 mg Oral BID  . gentamicin  80 mg Intravenous Q8H  . [EXPIRED] HYDROmorphone      . magnesium oxide  400 mg Oral Daily  . metronidazole  500 mg Intravenous Q8H  . multivitamin with minerals  1 tablet Oral Daily  . nystatin  5 mL Oral QID  . oxybutynin  5 mg Oral TID  . OxyCODONE  40 mg Oral Q12H  . phosphorus  500 mg Oral TID  . polyethylene glycol  17 g Oral BID  . protein supplement  8 oz Oral BID  . rifampin (RIFADIN) IVPB  300 mg Intravenous Q8H  . simvastatin   40 mg Oral QHS  . sodium chloride  10-40 mL Intracatheter Q12H  . vitamin C  500 mg Oral BID  . vitamin E  400 Units Oral Daily  . zinc sulfate  220 mg Oral Daily   Continuous Infusions:  PRN Meds:.acetaminophen, alum & mag hydroxide-simeth, HYDROmorphone (DILAUDID) injection, methocarbamol, ondansetron (ZOFRAN) IV, oxyCODONE, phenol, promethazine, ranitidine, sodium chloride, zolpidem   CMP     Component Value Date/Time   NA 137 08/12/2012 0537   K 3.7 08/12/2012 0537   CL 105 08/12/2012 0537   CO2 25 08/12/2012 0537   GLUCOSE 84 08/12/2012 0537   BUN 3* 08/12/2012 0537   CREATININE 0.30* 08/12/2012 0537   CALCIUM 8.2* 08/12/2012 0537   PROT 5.8* 08/01/2012 0513   ALBUMIN 1.0* 08/01/2012 0513   AST 17 08/01/2012 0513   ALT 10 08/01/2012 0513   ALKPHOS 100 08/01/2012 0513   BILITOT 0.4 08/01/2012 0513   GFRNONAA >90 08/12/2012 0537   GFRAA >90 08/12/2012 0537    CBG (last 3)  No results found for this basename: GLUCAP:3 in the last 72 hours   Intake/Output Summary (Last 24 hours) at 08/14/12 1345 Last data filed at 08/14/12 0454  Gross per 24 hour  Intake   1200 ml  Output   1230 ml  Net    -30 ml    Weight Status:  198 lbs, down from 208 lbs on 10/22   Re-estimated needs:  [phone] kcal, 120-145 gm protein   Nutrition Dx:  Inadequate oral intake-ongoing   Goal: Intake of >90% meals/supplements. --unmet   Monitor:  PO intake, weight, labs   Clarene Duke RD, LDN Pager [phone] After Hours pager [phone]
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
## 2012-08-14 NOTE — Progress Notes (Signed)
SUBJECTIVE: The patient is doing well today from a CV standpoint.  At this time, he denies chest pain, shortness of breath, or any new concerns.     . calcium carbonate (dosed in mg elemental calcium)  1,000 mg of elemental calcium Oral BID   And  . cholecalciferol  400 Units Oral BID  . carvedilol  3.125 mg Oral BID WC  .  ceFAZolin (ANCEF) IV  2 g Intravenous Q8H  . docusate  100 mg Oral BID  . enoxaparin (LOVENOX) injection  40 mg Subcutaneous Q24H  . feeding supplement  237 mL Oral BID BM  . ferrous gluconate  324 mg Oral BID  . gentamicin  80 mg Intravenous Q8H  . [EXPIRED] HYDROmorphone      . magnesium oxide  400 mg Oral Daily  . metronidazole  500 mg Intravenous Q8H  . multivitamin with minerals  1 tablet Oral Daily  . nystatin  5 mL Oral QID  . oxybutynin  5 mg Oral TID  . OxyCODONE  40 mg Oral Q12H  . phosphorus  500 mg Oral TID  . polyethylene glycol  17 g Oral BID  . protein supplement  8 oz Oral BID  . rifampin (RIFADIN) IVPB  300 mg Intravenous Q8H  . simvastatin  40 mg Oral QHS  . sodium chloride  10-40 mL Intracatheter Q12H  . vitamin C  500 mg Oral BID  . vitamin E  400 Units Oral Daily  . zinc sulfate  220 mg Oral Daily  . [DISCONTINUED] mineral oil  1 enema Rectal Once  . [DISCONTINUED] OxyCODONE  20 mg Oral Q12H  . [DISCONTINUED] rifampin  300 mg Oral TID PC      OBJECTIVE: Physical Exam: Filed Vitals:   08/13/12 2300 08/13/12 2302 08/14/12 0014 08/14/12 0341  BP: 101/56 101/56 97/57 105/59  Pulse:   103   Temp: 98.9 F (37.2 C)  98.9 F (37.2 C) 97.4 F (36.3 C)  TempSrc: Oral   Oral  Resp: 15  18 16   Height:      Weight:      SpO2: 98%  98% 97%    Intake/Output Summary (Last 24 hours) at 08/14/12 0827 Last data filed at 08/14/12 1610  Gross per 24 hour  Intake   2012 ml  Output   1805 ml  Net    207 ml    Telemetry reveals sinus rhythm, NSVT  GEN- The patient is chronically ill appearing, alert and oriented x 3 today.   Head-  normocephalic, atraumatic Eyes-  Sclera clear, conjunctiva pink Ears- hearing intact Oropharynx- clear Neck- supple, Lungs- Clear to ausculation bilaterally, normal work of breathing Heart- Regular rate and rhythm, no murmurs, rubs or gallops, PMI not laterally displaced GI- soft, NT, ND, + BS, ostomy stoma is pink  LABS: Basic Metabolic Panel:  Basename 08/12/12 0537 08/11/12 1926  NA 137 --  K 3.7 --  CL 105 --  CO2 25 --  GLUCOSE 84 --  BUN 3* --  CREATININE 0.30* 0.28*  CALCIUM 8.2* --  MG -- --  PHOS -- --   Liver Function Tests: No results found for this basename: AST:2,ALT:2,ALKPHOS:2,BILITOT:2,PROT:2,ALBUMIN:2 in the last 72 hours No results found for this basename: LIPASE:2,AMYLASE:2 in the last 72 hours CBC:  Basename 08/13/12 0314 08/12/12 0537  WBC 5.9 6.4  NEUTROABS -- --  HGB 9.9* 10.1*  HCT 30.8* 31.2*  MCV 87.3 87.2  PLT 286 288    ASSESSMENT AND PLAN:  Principal Problem:  *SIRS (systemic inflammatory response syndrome) Active Problems:  Sacral decubitus ulcer, stage IV  Paraplegia following T10spinal cord injury  Anemia  Cardiomyopathy  Hyperkalemia  Acute renal failure  Sepsis  Inguinal hernia, left  Septic shock(785.52)  Staphylococcus aureus bacteremia with sepsis  UTI (lower urinary tract infection)  Osteomyelitis of pelvic region  ICD (implantable cardiac defibrillator) infection  1. ICD system infection Doing very well s/p ICD system extraction Would looks good.  This can be followed in the office.  Dr Ladona Ridgel will arrange. He would be a very poor candidate for reimplantation in the future.  This should be avoided if at all possible  2. Ischemic CM Stable, without symptoms of ischemia or CHF  All other issues per the primary team Would transfer to telemetry today  No further inpatient EP workup/ treatment planned Dr Ladona Ridgel will follow his wound while here.  Call with questions  Hillis Range, MD 08/14/2012 8:27 AM

## 2012-08-14 NOTE — Progress Notes (Signed)
[facility]    Subjective:  . Afebrile. Improved nausea, no vomiting since meds changed to IV  Antibiotics: gent rifampin cefazolin metro po  Finished 7 d of meropenem on 10/23  Medications:    . calcium carbonate (dosed in mg elemental calcium)  1,000 mg of elemental calcium Oral BID   And  . cholecalciferol  400 Units Oral BID  . carvedilol  3.125 mg Oral BID WC  .  ceFAZolin (ANCEF) IV  2 g Intravenous Q8H  . docusate  100 mg Oral BID  . enoxaparin (LOVENOX) injection  40 mg Subcutaneous Q24H  . ferrous gluconate  324 mg Oral BID  . gentamicin  80 mg Intravenous Q8H  . [EXPIRED] HYDROmorphone      . magnesium oxide  400 mg Oral Daily  . metroNIDAZOLE  500 mg Oral TID  . multivitamin with minerals  1 tablet Oral Daily  . nystatin  5 mL Oral QID  . ondansetron  4 mg Oral TID  . oxybutynin  5 mg Oral TID  . OxyCODONE  40 mg Oral Q12H  . phosphorus  500 mg Oral TID  . polyethylene glycol  17 g Oral BID  . protein supplement  8 oz Oral BID  . simvastatin  40 mg Oral QHS  . sodium chloride  10-40 mL Intracatheter Q12H  . vitamin C  500 mg Oral BID  . vitamin E  400 Units Oral Daily  . zinc sulfate  220 mg Oral Daily  . [DISCONTINUED] feeding supplement  237 mL Oral BID BM  . [DISCONTINUED] metronidazole  500 mg Intravenous Q8H  . [DISCONTINUED] rifampin (RIFADIN) IVPB  300 mg Intravenous Q8H    Objective: Weight change:   Intake/Output Summary (Last 24 hours) at 08/14/12 1713 Last data filed at 08/14/12 1611  Gross per 24 hour  Intake   1082 ml  Output   1880 ml  Net   -798 ml   Blood pressure 104/60, pulse 69, temperature 97.6 F (36.4 C), temperature source Oral, resp. rate 16, height 6' 2.5" (1.892 m), weight 198 lb 10.2 oz (90.1 kg), SpO2 98.00%. Temp:  [97.3 F (36.3 C)-98.9 F (37.2 C)] 97.6 F (36.4 C) (11/04 1610) Pulse Rate:  [69-103] 69  (11/04 1610) Resp:  [15-19] 16  (11/04 1610) BP: (97-122)/(52-84) 104/60  mmHg (11/04 1610) SpO2:  [96 %-98 %] 98 % (11/04 1610)  Physical Exam: General: Alert and awake, oriented x3, not in any acute distress. HEENT: anicteric sclera, pupils reactive to light and accommodation, EOMI CVS regular rate, normal r,  no murmur rubs or gallops,  Chest wall = device pocket site is CDI not warm Chest: clear to auscultation bilaterally, no wheezing, rales or rhonchi Abdomen: soft, ostomy in LLQ, midline incision covered, serous drainage noted on bandage Ext = HE has no sensation below waist.  Large sacral decub -packed, but will need to see at wound change   Lab Results:  [facility] 08/13/12 0314 08/12/12 0537  WBC 5.9 6.4  HGB 9.9* 10.1*  HCT 30.8* 31.2*  PLT 286 288    BMET  Basename 08/12/12 0537 08/11/12 1926  NA 137 --  K 3.7 --  CL 105 --  CO2 25 --  GLUCOSE 84 --  BUN 3* --  CREATININE 0.30* 0.28*  CALCIUM 8.2* --    Micro Results: 10/15 blood cx 1/2 MSSA(S to oxa, clinda, erythro, rifampin, bactrim, gent, levo, vanco 1) 10/15 wound cx few MSSA  but g/s shows gnr, gpc, gpr 10/15 urine cx: ESBL Ecoli 10/17 blood cx: NGTD 10/20 left hip cx (labelled as shoulder, accidentally): NGTD   Assessment/Plan: Lenny Fiumara is a 36 y.o. male with  MSSA bacteremia c/b cardiac device infection/endocarditis and sacral osteomyelitis with chronic decubitus ulcer.  1)  methicillin sensitive staph aureus bacteremia and cardiac device infection/endocarditis :  - continue on cefazolin 2gm IV Q8hr, until dec 13th   - since his device is removed, will not need rifampin.  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 Currently on cefazolin 2gm IV Q 8hr plus oral metronidazole 500mg  TID.   Would recommend to ask plastics recommendation for management of  wound for dispo  3) nausea =  will schedule zofran 4mg  prior to flagyl to see if it helps him with tolerating nausea side effect.  4) extended spectrum beta-lactamase Ecoli in the urine: resolved, treated for 7 days with meropenem,   Will arrange for follow up in ID clinic   LOS: 20 days   Aundray Cartlidge 08/14/2012, 5:13 PM

## 2012-08-14 NOTE — Progress Notes (Signed)
Pt has refused staple removal and steristrip placement twice. Colostomy changed and dressings changes this am. Report called to floor RN. She was informed to attempt staple removal again prior to shift completion. Mother at  Bedside for transfer.

## 2012-08-14 NOTE — Progress Notes (Signed)
PT HYDROTHERAPY PROGRESS NOTE   08/14/12 1002  Pressure Ulcer 07/25/12 Stage IV - Full thickness tissue loss with exposed bone, tendon or muscle. undermining, tunneling; pink, yellow, green  Date First Assessed/Time First Assessed: 07/25/12 1930   Location: Sacrum  Location Orientation: Posterior;Lower  Staging: Stage IV - Full thickness tissue loss with exposed bone, tendon or muscle.  Wound Description (Comments): undermining, tunneling; p  State of Healing Early/partial granulation  Site / Wound Assessment Granulation tissue;Pink;Red;Yellow  % Wound base Red or Granulating 90%  % Wound base Yellow 10%  Peri-wound Assessment Intact  Wound Length (cm) 18 cm  Wound Width (cm) 10 cm  Wound Depth (cm) 5 cm  Undermining (cm) 8.7 (at 8:00)  Margins Unattacted edges (unapproximated)  Drainage Amount Moderate  Drainage Description Purulent  Treatment Packing (Saline gauze)  Dressing Type ABD;Barrier Film (skin prep);Gauze (Comment) (moist to dry kerlex)  Dressing Changed  Hydrotherapy  Pulsed Lavage with Suction (psi) 12 psi  Pulsed Lavage with Suction - Normal Saline Used 1000 mL  Pulsed Lavage Tip Tip with splash shield  Pulsed lavage therapy - wound location sacrum  Selective Debridement  Selective Debridement - Location sacrum  Selective Debridement - Tools Used Forceps;Scissors  Selective Debridement - Tissue Removed yellow slough/necrotic tissue  Wound Therapy - Assess/Plan/Recommendations  Wound Therapy - Clinical Statement Pt able to tolerate wound pulsatile lavage and dressing change.  Wound continues to have large amounts of purulent drainage.  Will continue wound care as indicated; pt may benefit from enzymatic oitment (santyl) for necrotic areas to facilitate further debridement.    Factors Delaying/Impairing Wound Healing Altered sensation;Infection - systemic/local;Immobility  Hydrotherapy Plan Debridement;Dressing change;Patient/family education;Pulsatile lavage with  suction  Wound Therapy - Frequency 6X / week  Wound Therapy - Follow Up Recommendations Skilled nursing facility  Wound Plan Remeasure and re-assess after 1 week  Wound Therapy Goals - Improve the function of patient's integumentary system by progressing the wound(s) through the phases of wound healing by:  Decrease Necrotic Tissue to <10  Decrease Necrotic Tissue - Progress Updated due to goal met  Increase Granulation Tissue to >90  Increase Granulation Tissue - Progress Updated due to goal met  Time For Goal Achievement 7 days  Wound Therapy - Potential for Goals Fair     Pt premedicated prior to treatment  Charlotte Crumb, PT DPT  [phone]

## 2012-08-14 NOTE — Progress Notes (Signed)
OT/PT Cancellation Note  Patient Details Name: [name] [name] MRN: [id number] DOB: Oct 07, 1976   Cancelled Treatment:    Pt currently transferring to new room, will f/u as time allows. Question how much we can progress sitting activities/transfers to w/c given severity of sacral wound and potential for healing. Have left message for Shawn Rayburn with plastics.    [facility] HELEN 08/14/2012, 2:27 PM Pager: [phone]

## 2012-08-15 DIAGNOSIS — I5022 Chronic systolic (congestive) heart failure: Secondary | ICD-10-CM

## 2012-08-15 LAB — URINE CULTURE

## 2012-08-15 MED ORDER — POLYETHYLENE GLYCOL 3350 17 G PO PACK
34.0000 g | PACK | Freq: Two times a day (BID) | ORAL | Status: DC
  Administered 2012-08-16 – 2012-08-19 (×4): 34 g via ORAL
  Filled 2012-08-15 (×13): qty 2

## 2012-08-15 MED ORDER — OXYCODONE HCL ER 40 MG PO T12A
60.0000 mg | EXTENDED_RELEASE_TABLET | Freq: Two times a day (BID) | ORAL | Status: DC
  Administered 2012-08-15 – 2012-08-21 (×12): 60 mg via ORAL
  Filled 2012-08-15 (×12): qty 1

## 2012-08-15 MED ORDER — COLLAGENASE 250 UNIT/GM EX OINT
TOPICAL_OINTMENT | Freq: Every day | CUTANEOUS | Status: DC
  Administered 2012-08-15 – 2012-08-16 (×2): via TOPICAL
  Filled 2012-08-15: qty 30

## 2012-08-15 MED ORDER — ENSURE COMPLETE PO LIQD
237.0000 mL | Freq: Two times a day (BID) | ORAL | Status: DC
  Administered 2012-08-15 – 2012-08-21 (×8): 237 mL via ORAL
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
## 2012-08-15 NOTE — Progress Notes (Signed)
Clinical Social Worker spoke with pt at bedside and reviewed medical recommendations for SNF.  Pt states, "I want to follow medical recommendations".  CSW reviewed SNF process and addressed questions/concerns.  CSW to begin SNF search.  CSW staffed case with MD.  CSW to meet with pt and pt's mother when she arrives after her 10:00pm medical appointment.  CSW to continue to follow and assist as needed.   Angelia Mould, MSW, [place] [phone]

## 2012-08-15 NOTE — Progress Notes (Signed)
Patient ID: [name] [name], male   DOB: [birth year], 36 y.o.   MRN: [id number] Subjective:  Stable after ICD system extraction. No fever, chills, chest pain or sob. Objective:  Vital Signs in the last 24 hours: Temp:  [98.1 F (36.7 C)-98.8 F (37.1 C)] 98.8 F (37.1 C) (11/05 1508) Pulse Rate:  [96-106] 106  (11/05 0527) Resp:  [16] 16  (11/05 1508) BP: (112-128)/(69-77) 113/69 mmHg (11/05 1508) SpO2:  [100 %] 100 % (11/05 1508) Weight:  [198 lb (89.812 kg)] 198 lb (89.812 kg) (11/05 1508)  Intake/Output from previous day: 11/04 0701 - 11/05 0700 In: 152 [IV Piggyback:152] Out: 750 [Urine:750] Intake/Output from this shift:    Physical Exam: Well appearing NAD HEENT: Unremarkable Neck:  No JVD, no thyromegally Lungs:  Clear with no wheezes, rales or rhonchi. ICD explantation site looks clean and dry without hematoma. HEART:  Regular rate rhythm, no murmurs, no rubs, no clicks Abd:  Flat, positive bowel sounds, no organomegally, no rebound, no guarding Ext:  2 plus pulses, no edema, no cyanosis, no clubbing Skin:  No rashes no nodules Neuro:  CN II through XII intact, motor grossly intact  Lab Results:  Basename 08/13/12 0314  WBC 5.9  HGB 9.9*  PLT 286   No results found for this basename: NA:2,K:2,CL:2,CO2:2,GLUCOSE:2,BUN:2,CREATININE:2 in the last 72 hours No results found for this basename: TROPONINI:2,CK,MB:2 in the last 72 hours Hepatic Function Panel No results found for this basename: PROT,ALBUMIN,AST,ALT,ALKPHOS,BILITOT,BILIDIR,IBILI in the last 72 hours No results found for this basename: CHOL in the last 72 hours No results found for this basename: PROTIME in the last 72 hours  Imaging: No results found.  Cardiac Studies: Tele - NSR Assessment/Plan:  1. 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 2. ICD system infection 3. S/p extraction 4. Sacral decubitus 5. S/p colonostomy Rec: no additional recs from EP perspective. We will schedule him a followup in  office in 7-10 days for suture removal. He should be on appropriate CHF meds with a beta blocker and an ACE inhibitor/ARB. He decubitus and other comobidities make repeat device implant contraindicated. I would not suggest a Life Vest.  LOS: 21 days    [name] [name],M.D. 08/15/2012, 8:25 PM

## 2012-08-15 NOTE — Progress Notes (Signed)
PT HYDROTHERAPY PROGRESS NOTE   08/15/12 0900  Pressure Ulcer 07/25/12 Stage IV - Full thickness tissue loss with exposed bone, tendon or muscle. undermining, tunneling; pink, yellow, green  Date First Assessed/Time First Assessed: 07/25/12 1930   Location: Sacrum  Location Orientation: Posterior;Lower  Staging: Stage IV - Full thickness tissue loss with exposed bone, tendon or muscle.  Wound Description (Comments): undermining, tunneling; p  State of Healing Early/partial granulation  Site / Wound Assessment Granulation tissue;Pink;Red;Yellow  % Wound base Red or Granulating 90%  % Wound base Yellow 10%  Peri-wound Assessment Intact  Undermining (cm) 8.7 (at 8:00)  Margins Unattacted edges (unapproximated)  Drainage Amount Moderate  Drainage Description Purulent  Treatment Packing (Saline gauze)  Dressing Type ABD;Barrier Film (skin prep);Gauze (Comment) (moist to dry kerlex)  Dressing Changed  Hydrotherapy  Pulsed Lavage with Suction (psi) 12 psi  Pulsed Lavage with Suction - Normal Saline Used 1000 mL  Pulsed Lavage Tip Tip with splash shield  Pulsed lavage therapy - wound location sacrum  Selective Debridement  Selective Debridement - Location sacrum  Selective Debridement - Tools Used Forceps;Scalpel  Selective Debridement - Tissue Removed yellow slough/necrotic tissue  Wound Therapy - Assess/Plan/Recommendations  Wound Therapy - Clinical Statement Pt able to tolerate wound pulsatile lavage and dressing change.  Wound continues to have large amounts of purulent drainage.  Will continue wound care as indicated; pt may benefit from enzymatic oitment (santyl) for necrotic areas to facilitate further debridement. Pt inquired about Aquacel dressing which may be beneficial; additionally still recommend use of Santyl to facilitate further debridement.   Factors Delaying/Impairing Wound Healing Altered sensation;Infection - systemic/local;Immobility  Hydrotherapy Plan Debridement;Dressing  change;Patient/family education;Pulsatile lavage with suction  Wound Therapy - Frequency 6X / week  Wound Therapy - Follow Up Recommendations Skilled nursing facility  Wound Plan Remeasure and re-assess after 1 week  Wound Therapy Goals - Improve the function of patient's integumentary system by progressing the wound(s) through the phases of wound healing by:  Decrease Necrotic Tissue to <10  Decrease Necrotic Tissue - Progress Progressing toward goal  Increase Granulation Tissue to >90  Increase Granulation Tissue - Progress Progressing toward goal  Time For Goal Achievement 7 days  Wound Therapy - Potential for Goals Fair    Pt pre-medicated prior to treatment.  Charlotte Crumb, PT DPT  [phone]
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
## 2012-08-16 ENCOUNTER — Other Ambulatory Visit: Payer: Self-pay

## 2012-08-16 LAB — CBC
HCT: 29.5 % — ABNORMAL LOW (ref 39.0–52.0)
Hemoglobin: 9.4 g/dL — ABNORMAL LOW (ref 13.0–17.0)
MCH: 28.7 pg (ref 26.0–34.0)
MCHC: 31.9 g/dL (ref 30.0–36.0)
MCV: 90.2 fL (ref 78.0–100.0)
RDW: 16.4 % — ABNORMAL HIGH (ref 11.5–15.5)

## 2012-08-16 LAB — COMPREHENSIVE METABOLIC PANEL
ALT: <5 U/L (ref 0–53)
AST: 10 U/L (ref 0–37)
Albumin: 1.3 g/dL — ABNORMAL LOW (ref 3.5–5.2)
Alkaline Phosphatase: 97 U/L (ref 39–117)
Calcium: 8.2 mg/dL — ABNORMAL LOW (ref 8.4–10.5)
GFR calc Af Amer: >90 mL/min (ref >90)
Glucose, Bld: 105 mg/dL — ABNORMAL HIGH (ref 70–99)
Potassium: 3.4 mEq/L — ABNORMAL LOW (ref 3.5–5.1)
Sodium: 137 mEq/L (ref 135–145)
Total Protein: 6.2 g/dL (ref 6.0–8.3)

## 2012-08-16 MED ORDER — GI COCKTAIL ~~LOC~~
30.0000 mL | Freq: Once | ORAL | Status: AC
  Administered 2012-08-16: 30 mL via ORAL
  Filled 2012-08-16: qty 30

## 2012-08-16 MED ORDER — POTASSIUM CHLORIDE CRYS ER 20 MEQ PO TBCR
40.0000 meq | EXTENDED_RELEASE_TABLET | Freq: Once | ORAL | Status: AC
  Administered 2012-08-16: 40 meq via ORAL
  Filled 2012-08-16: qty 2

## 2012-08-16 NOTE — Progress Notes (Addendum)
[facility]    Subjective:  Afebrile. Improved nausea  Antibiotics:  cefazolin metro po  Finished 7 d of meropenem on 10/23  Medications:    . calcium carbonate (dosed in mg elemental calcium)  1,000 mg of elemental calcium Oral BID   And  . cholecalciferol  400 Units Oral BID  . carvedilol  3.125 mg Oral BID WC  .  ceFAZolin (ANCEF) IV  2 g Intravenous Q8H  . collagenase   Topical Daily  . enoxaparin (LOVENOX) injection  40 mg Subcutaneous Q24H  . feeding supplement  237 mL Oral BID BM  . ferrous gluconate  324 mg Oral BID  . magnesium oxide  400 mg Oral Daily  . metroNIDAZOLE  500 mg Oral TID  . multivitamin with minerals  1 tablet Oral Daily  . nystatin  5 mL Oral QID  . ondansetron  4 mg Oral TID  . oxybutynin  5 mg Oral TID  . OxyCODONE  60 mg Oral Q12H  . phosphorus  500 mg Oral TID  . polyethylene glycol  34 g Oral BID  . potassium chloride  40 mEq Oral Once  . protein supplement  8 oz Oral BID  . simvastatin  40 mg Oral QHS  . sodium chloride  10-40 mL Intracatheter Q12H  . vitamin C  500 mg Oral BID  . vitamin E  400 Units Oral Daily  . zinc sulfate  220 mg Oral Daily  . [DISCONTINUED] docusate  100 mg Oral BID  . [DISCONTINUED] gentamicin  80 mg Intravenous Q8H  . [DISCONTINUED] OxyCODONE  40 mg Oral Q12H  . [DISCONTINUED] polyethylene glycol  17 g Oral BID    Objective: Weight change:   Intake/Output Summary (Last 24 hours) at 08/16/12 0927 Last data filed at 08/15/12 2000  Gross per 24 hour  Intake     20 ml  Output   1700 ml  Net  -1680 ml   Blood pressure 105/69, pulse 93, temperature 98.2 F (36.8 C), temperature source Oral, resp. rate 18, height 6' 2.5" (1.892 m), weight 198 lb (89.812 kg), SpO2 100.00%. Temp:  [98.2 F (36.8 C)-98.8 F (37.1 C)] 98.2 F (36.8 C) (11/06 0500) Pulse Rate:  [73-93] 93  (11/06 0809) Resp:  [16-18] 18  (11/06 0500) BP: (100-113)/(63-69) 105/69 mmHg (11/06 0809) SpO2:   [100 %] 100 % (11/06 0500) Weight:  [198 lb (89.812 kg)] 198 lb (89.812 kg) (11/05 1508)  Physical Exam: General: Alert and awake, oriented x3, not in any acute distress. HEENT: anicteric sclera, pupils reactive to light and accommodation, EOMI CVS regular rate, normal r,  no murmur rubs or gallops,  Chest wall = device pocket site is CDI not warm Chest: clear to auscultation bilaterally, no wheezing, rales or rhonchi Abdomen: soft, ostomy in LLQ, midline incision covered, serous drainage noted on bandage Ext = HE has no sensation below waist.  Large sacral decub -packed, but will need to see at wound change   Lab Results:  Basename 08/16/12 0500  WBC 5.3  HGB 9.4*  HCT 29.5*  PLT 316    BMET  Basename 08/16/12 0500  NA 137  K 3.4*  CL 103  CO2 26  GLUCOSE 105*  BUN 5*  CREATININE 0.36*  CALCIUM 8.2*    Micro Results: 10/15 blood cx 1/2 MSSA(S to oxa, clinda, erythro, rifampin, bactrim, gent, levo, vanco 1) 10/15 wound cx few MSSA but g/s shows gnr, gpc, gpr 10/15  urine cx: ESBL Ecoli 10/17 blood cx: NGTD 10/20 left hip cx (labelled as shoulder, accidentally): NGTD   Assessment/Plan: [name] [name] is a 36 y.o. male with  MSSA bacteremia c/b ICD infection/endocarditis and sacral osteomyelitis with chronic decubitus ulcer.  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 Currently on cefazolin 2gm IV Q 8hr plus oral metronidazole 500mg  TID.   Would recommend to ask plastics recommendation for management of wound for dispo  3) nausea =  will change zofran 4mg  to PRN 30 min to flagyl to see if it helps him with tolerating nausea side effect.  4) dispo = patient should go to a SNF in  order to get wound care and IV antibiotics. I don't think that going home is a good option since they have limited transportation for him to get to his follow up appts and proper wound care eval. I think sending him home will ultimately lead to him re-hospitalized.  Will arrange for follow up in ID clinic in the first week of December to see how he is doing, if need to extend abtx for addn 2 wks for a total of 8 wks for osteomyelitis  Will need arrangement for wound care clinic.   LOS: 22 days   Elfreida Heggs 08/16/2012, 9:27 AM

## 2012-08-16 NOTE — Progress Notes (Signed)
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 undermining, tunneling; pink, yellow, green  Date First Assessed/Time First Assessed: 07/25/12 1930   Location: Sacrum  Location Orientation: Posterior;Lower  Staging: Stage IV - Full thickness tissue loss with exposed bone, tendon or muscle.  Wound Description (Comments): undermining, tunneling; p  State of Healing Early/partial granulation  Site / Wound Assessment Granulation tissue;Pink;Red;Yellow  % Wound base Red or Granulating 90%  % Wound base Yellow 10%  Peri-wound Assessment Intact  Undermining (cm) 8.7 (at 8:00)  Margins Unattacted edges (unapproximated)  Drainage Amount Moderate  Drainage Description Purulent;Odor  Treatment Cleansed;Packing (Saline gauze)  Dressing Type ABD;Barrier Film (skin prep);Gauze (Comment) (moist to dry kerlex)  Dressing Changed  Hydrotherapy  Pulsed Lavage with Suction (psi) 12 psi  Pulsed Lavage with Suction - Normal Saline Used 1000 mL  Pulsed Lavage Tip Tip with splash shield  Pulsed lavage therapy - wound location sacrum  Selective Debridement  Selective Debridement - Location sacrum  Selective Debridement - Tools Used Forceps;Scalpel  Selective Debridement - Tissue Removed yellow slough/necrotic tissue  Wound Therapy - Assess/Plan/Recommendations  Wound Therapy - Clinical Statement Wound Care nsg available for assessment and planning.  Discussed at length use of aquacell and possible change to sponge wound vac next session for further management of wound.  Pt able to tolerate wound pulsatile lavage and dressing change to incorporate aquacel into tunneling portion of wound.  Wound continues to have large amounts of purulent drainage.  Will continue wound care as indicated.  Factors Delaying/Impairing Wound Healing Altered sensation;Infection - systemic/local;Immobility  Hydrotherapy Plan  Debridement;Dressing change;Patient/family education;Pulsatile lavage with suction  Wound Therapy - Frequency 6X / week  Wound Therapy - Follow Up Recommendations Skilled nursing facility  Wound Plan Remeasure and re-assess after 1 week  Wound Therapy Goals - Improve the function of patient's integumentary system by progressing the wound(s) through the phases of wound healing by:  Decrease Necrotic Tissue to <10  Decrease Necrotic Tissue - Progress Progressing toward goal  Increase Granulation Tissue to >90  Increase Granulation Tissue - Progress Progressing toward goal  Time For Goal Achievement 7 days  Wound Therapy - Potential for Goals Fair    Pt pre-medicated prior to treatment session.   Charlotte Crumb, PT DPT  [phone]

## 2012-08-16 NOTE — Consult Note (Addendum)
Wound care follow-up:  Assessed sacrum wound during hydrotherapy.  Refer to their notes for assessment and measurements.  Wound bed is fairly clean and appropriate for vac therapy, which would help promote wound healing and assist with decreasing drainage in wound bed.  Mod thick tan drainage in wounbed when dressing removed.  Remains with tunneling.  Colostomy pouch intact with good seal.  Pt states bedside nurse changed yesterday.  Stoma red and viable above skin level.  Mod brown stool in pouch. Supplies at bedside for staff use. Plan:   Physical therapy will perform hydrotherapy  On 11/7, then apply vac tp promote healing.  We will need a day to be sure vac will hold seal, so please avoid transfer until Friday.Pt is not sure if he will consent to SNF placement.  If he is to be discharged to home with mother, then he would need to obtain home vac approval. And would need an air mattress wherever he is transferred. Spent 1 hour and 15 minutes assessing wound, ostomy and discussing options and plan of care with patient who had many questions and concerns.    Cammie Mcgee, RN, MSN, [employer]  [phone]

## 2012-08-16 NOTE — Progress Notes (Signed)
Clinical Social Worker left voicemail message with [name] [name] are currently reviewing information for potential placement.  CSW spoke with pt regarding disposition.  Pt is requesting home with health be considered as an option as he is adamantly against going to [facility]-no other beds in [facility] are .  Pt states he has received teaching on his ostomy pouch today and feels he would be able to care for this at home.  Pt also states that he is receiving a wound vac tomorrow and believes that with home health he and his mother would be able to follow medical recommendations.  CSW staffed case with MD and RNCM.  CSW to continue to follow and assist as needed.   [name] [name], MSW, [place] [phone]
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
## 2012-08-16 NOTE — Progress Notes (Signed)
Occupational Therapy Treatment Patient Details Name: [name] [name] MRN: [id number] DOB: 01-07-1976 Today's Date: 08/16/2012 Time: 4098-1191 OT Time Calculation (min): 44 min  OT Assessment / Plan / Recommendation Comments on Treatment Session Pt progressing with therapy- given exercises to promote shoulder retraction and neck extension    Follow Up Recommendations  SNF    Barriers to Discharge       Equipment Recommendations  None recommended by PT;None recommended by OT    Recommendations for Other Services    Frequency     Plan Discharge plan remains appropriate    Precautions / Restrictions Precautions Precautions: Fall Precaution Comments: full thickness sacral wound; abd incision, new colostomy Restrictions Other Position/Activity Restrictions: paraplegic (T10)   Pertinent Vitals/Pain Pt reports abdominal pain and leg/neck spasms. Pt did not rate and states RN is aware    ADL  Grooming: Min guard;Set up Where Assessed - Grooming: Supported sitting Upper Body Bathing: Min guard Where Assessed - Upper Body Bathing: Supported sitting Transfers/Ambulation Related to ADLs: bed mobility only ADL Comments: pt given exercises to promote shoulder retraction and head extension    OT Diagnosis:    OT Problem List:   OT Treatment Interventions:     OT Goals ADL Goals Pt Will Transfer to Toilet: with mod assist;with transfer board ADL Goal: Toilet Transfer - Progress: Goal set today Arm Goals Pt Will Complete Theraband Exer: Independently;Bilateral upper extremities;to maintain strength;15 reps;3 sets;Level 4 Theraband Arm Goal: Theraband Exercises - Progress: Goal set today  Visit Information  Last OT Received On: 08/16/12 Assistance Needed: +2    Subjective Data      Prior Functioning       Cognition  Overall Cognitive Status: Appears within functional limits for tasks assessed/performed Arousal/Alertness: Awake/alert Orientation Level: Appears intact for  tasks assessed Behavior During Session: [facility] for tasks performed    Mobility  Shoulder Instructions Bed Mobility Bed Mobility: Sitting - Scoot to Edge of Bed Rolling Right: 4: Min assist (reciprocal scooting) Supine to Sit: 4: Min assist;With rails Sit to Supine: 3: Mod assist;HOB flat Scooting to HOB: 6: Modified independent (Device/Increase time);With rail Details for Bed Mobility Assistance: From long sitting, pt able to bring bil LE off EOB with UE's        Exercises  Other Exercises Other Exercises: chin tucks x 10, 4 times per day Other Exercises: scapular adduction 10x, 4 times per day Other Exercises: lie with HOB less than 30degrees , 3x/day   Balance     End of Session OT - End of Session Activity Tolerance: Patient tolerated treatment well Patient left: in bed;with call bell/phone within reach (lunch tray set up)  GO     Stepen Prins 08/16/2012, 5:02 PM

## 2012-08-17 ENCOUNTER — Inpatient Hospital Stay (HOSPITAL_COMMUNITY): Payer: Medicaid Other

## 2012-08-17 DIAGNOSIS — Z87440 Personal history of urinary (tract) infections: Secondary | ICD-10-CM

## 2012-08-17 DIAGNOSIS — N289 Disorder of kidney and ureter, unspecified: Secondary | ICD-10-CM

## 2012-08-17 MED ORDER — POTASSIUM CHLORIDE 10 MEQ/100ML IV SOLN
10.0000 meq | INTRAVENOUS | Status: AC
  Administered 2012-08-17 (×4): 10 meq via INTRAVENOUS
  Filled 2012-08-17 (×4): qty 100

## 2012-08-17 MED ORDER — POTASSIUM CHLORIDE 20 MEQ/15ML (10%) PO LIQD
40.0000 meq | Freq: Once | ORAL | Status: DC
  Filled 2012-08-17: qty 30

## 2012-08-17 MED ORDER — PANTOPRAZOLE SODIUM 40 MG PO TBEC
40.0000 mg | DELAYED_RELEASE_TABLET | Freq: Two times a day (BID) | ORAL | Status: DC
  Administered 2012-08-17 – 2012-08-21 (×8): 40 mg via ORAL
  Filled 2012-08-17 (×7): qty 1

## 2012-08-17 NOTE — Progress Notes (Signed)
Pt c/o "severe indigestion."  Pain in mid epigastric area, gets worse when he belches, burning sensation, and "feels like a bunch of pills stuck in my stomach" Pt received MOM and Zantac with no relieve. NP, Craige Cotta paged and ordered GI cocktail X1.    Pt continues to c/o of indigestion with minimal relieved from GI cocktail. He received dilaudid IV and EKG was performed to r/o cardiac pain. EKG was negative. NP paged again and pt declined any further interventions. Stated "I will just take the PRN oxycodone and wait it out." NP aware.

## 2012-08-17 NOTE — Progress Notes (Signed)
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 undermining, tunneling; pink, yellow, green  Date First Assessed/Time First Assessed: 07/25/12 1930   Location: Sacrum  Location Orientation: Posterior;Lower  Staging: Stage IV - Full thickness tissue loss with exposed bone, tendon or muscle.  Wound Description (Comments): undermining, tunneling; p  State of Healing Early/partial granulation  Site / Wound Assessment Granulation tissue;Pink;Red;Yellow  % Wound base Red or Granulating 90%  % Wound base Yellow 10%  Peri-wound Assessment Intact  Margins Unattacted edges (unapproximated)  Drainage Amount Moderate  Drainage Description Purulent;Odor  Treatment Cleansed;Hydrotherapy (Pulse lavage);Debridement (Selective);Negative pressure wound therapy  Dressing Type Barrier Film (skin prep);Negative pressure wound therapy (white sponge applied to tract at 11.00; large black to body)  Dressing Changed  Pressure Ulcer 07/25/12 Stage II -  Partial thickness loss of dermis presenting as a shallow open ulcer with a red, pink wound bed without slough. pink, open  Date First Assessed/Time First Assessed: 07/25/12 1930   Location: Buttocks  Location Orientation: Right;Posterior  Staging: Stage II -  Partial thickness loss of dermis presenting as a shallow open ulcer with a red, pink wound bed without slough.  Wound  Dressing Type Foam  Dressing Changed  Hydrotherapy  Pulsed Lavage with Suction (psi) 12 psi  Pulsed Lavage with Suction - Normal Saline Used 1000 mL  Pulsed Lavage Tip Tip with splash shield  Pulsed lavage therapy - wound location sacrum  Selective Debridement  Selective Debridement - Location sacrum  Selective Debridement - Tools Used Forceps;Scalpel  Selective Debridement - Tissue Removed yellow slough/necrotic tissue  Wound Therapy - Assess/Plan/Recommendations  Wound Therapy - Clinical  Statement Pt able to tolerate wound pulsatile lavage and negative pressure therapy applied to wound in two segments; white sponge placed into tunneling portion of wound; large black sponge placed in body of wound.  Wound continues to have large amounts of purulent drainage.  Will continue wound care as indicated.  Wound Therapy - Functional Problem List decreased skin integrity and decreased mobility  Factors Delaying/Impairing Wound Healing Altered sensation;Infection - systemic/local;Immobility  Hydrotherapy Plan Debridement;Dressing change;Patient/family education;Pulsatile lavage with suction  Wound Therapy - Frequency 6X / week  Wound Therapy - Follow Up Recommendations Skilled nursing facility  Wound Plan Remeasure and re-assess after 1 week  Wound Therapy Goals - Improve the function of patient's integumentary system by progressing the wound(s) through the phases of wound healing by:  Decrease Necrotic Tissue to <10  Decrease Necrotic Tissue - Progress Progressing toward goal  Increase Granulation Tissue to >90  Increase Granulation Tissue - Progress Progressing toward goal  Time For Goal Achievement 7 days  Wound Therapy - Potential for Goals Fair     Pt pre-medicated prior to therapy session.   Charlotte Crumb, PT DPT  [phone]

## 2012-08-17 NOTE — Progress Notes (Signed)
Physical Therapy Treatment Patient Details Name: [name] [name] MRN: [id number] DOB: [birth year] Today's Date: 08/17/2012 Time: 1027-2536 PT Time Calculation (min): 49 min  PT Assessment / Plan / Recommendation Comments on Treatment Session  Sat EOB for total of ~15 min and with erect posture for ~4 minutes at a time.    Follow Up Recommendations  SNF;LTACH     Does the patient have the potential to tolerate intense rehabilitation  No, Recommend SNF  Barriers to Discharge        Equipment Recommendations  None recommended by PT;None recommended by OT    Recommendations for Other Services    Frequency Min 2X/week   Plan Discharge plan remains appropriate    Precautions / Restrictions Precautions Precautions: Fall Precaution Comments: full thickness sacral wound; abd incision, new colostomy, new VAC dressing Restrictions Other Position/Activity Restrictions: paraplegic (T10)   Pertinent Vitals/Pain     Mobility  Bed Mobility Bed Mobility: Supine to Sit;Sitting - Scoot to Edge of Bed;Sit to Supine Supine to Sit: 4: Min assist;HOB elevated (HOB ~20 degrees, no rails) Sitting - Scoot to Edge of Bed: 4: Min assist;4: Min guard Sit to Supine: 3: Mod assist;HOB flat Scooting to HOB: 6: Modified independent (Device/Increase time);With rail Details for Bed Mobility Assistance: Vc/vis cues for trying to get from supine to sit without raills up via R elbow;  return to supine by hooking his legs with UE's and LE assist Transfers Transfers: Lateral/Scoot Transfers Lateral/Scoot Transfers: 4: Min assist Details for Transfer Assistance: Practice scooting down to the foot of the bed and then eventually scooting back to the [employer] all with UE's and self positioning his feet.  Only assist was mild help with pad to decr sheer. Ambulation/Gait Ambulation/Gait Assistance: Not tested (comment) Stairs: No    Exercises     PT Diagnosis:    PT Problem List:   PT Treatment Interventions:       PT Goals Acute Rehab PT Goals Time For Goal Achievement: 08/31/12 Potential to Achieve Goals: Good PT Goal: Rolling Supine to Right Side - Progress: Goal set today PT Goal: Rolling Supine to Left Side - Progress: Goal set today Pt will go Supine/Side to Sit: with supervision;with HOB 0 degrees PT Goal: Supine/Side to Sit - Progress: Goal set today Pt will Sit at Edge of Bed: 3-5 min;with no upper extremity support;with min assist PT Goal: Sit at Edge Of Bed - Progress: Goal set today PT Transfer Goal: Bed to Chair/Chair to Bed - Progress: Goal set today  Visit Information  Last PT Received On: 08/17/12 Assistance Needed: +2    Subjective Data  Subjective: My spasms start here  (lower back) and run down my leg   Cognition  Overall Cognitive Status: Appears within functional limits for tasks assessed/performed Arousal/Alertness: Awake/alert Orientation Level: Appears intact for tasks assessed Behavior During Session: [facility] for tasks performed    Balance  Balance Balance Assessed: Yes Static Sitting Balance Static Sitting - Balance Support: Bilateral upper extremity supported Static Sitting - Level of Assistance: 5: Stand by assistance;Other (comment) (min guard assist) Static Sitting - Comment/# of Minutes: Worked EOB on sitting with upright posture and adding UE's in and taking them away as able.  APPROX 15 min  End of Session PT - End of Session Activity Tolerance: Patient tolerated treatment well Patient left: in bed;with call bell/phone within reach;with nursing in room Nurse Communication: Mobility status   GP     Mariafernanda Hendricksen, Eliseo Gum 08/17/2012, 2:18 PM  08/17/2012  [place], PT [phone] (pager)
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
## 2012-08-17 NOTE — Consult Note (Addendum)
Wound care follow-up: Discussed plan of care with physical therapy.  They performed hydrotherapy and applied vac dressing to sacrum using white sponge to tunneling areas and black foam over wound bed.  No problem obtaining a seal to dressing.  Cont suction on at .  Refer to PT progress notes for further assessment and description.  From a wound care standpoint, the vac dressing and colostomy can be managed at a SNF.  If pt remains in the hospital on Monday, WOC nurse will change dressing at that time. Pt will have total assistance with ostomy pouching application and emptying at SNF.  Supplies at bedside for staff use. He will need an air mattress to reduce pressure to sacrum/buttocks areas.   Cammie Mcgee, RN, MSN, [employer]  [phone]

## 2012-08-17 NOTE — Progress Notes (Addendum)
Physical Therapy Treatment Patient Details Name: [name] [name] MRN: [id number] DOB: [birth year] Today's Date: 08/17/2012 Time:  -     PT Assessment / Plan / Recommendation Comments on Treatment Session  Pt able to tolerte sitting upright for ~10 minutes while performing sustained shoulder retraction exercises and arm push ups on bed.  Educated on proper posture and chin tucks due to forward head posture and occasionl neck mm spasms.  Pt willing to work and will benefit from continue strengthening to improve overall ability to transfer to w/c.    Follow Up Recommendations  LTACH;SNF     Does the patient have the potential to tolerate intense rehabilitation  No, Recommend SNF  Barriers to Discharge        Equipment Recommendations  None recommended by PT;None recommended by OT    Recommendations for Other Services    Frequency Min 2X/week   Plan Discharge plan remains appropriate    Precautions / Restrictions Precautions Precautions: Fall Precaution Comments: full thickness sacral wound; abd incision, new colostomy Restrictions Other Position/Activity Restrictions: paraplegic (T10)   Pertinent Vitals/Pain C/o occasional mm spasms from his back to neck making pt go into flexion    Mobility  Bed Mobility Bed Mobility: Sitting - Scoot to Edge of Bed Rolling Right: 4: Min assist (reciprocal scooting) Supine to Sit: 4: Min assist;With rails Sit to Supine: 3: Mod assist;HOB flat Scooting to HOB: 6: Modified independent (Device/Increase time);With rail Details for Bed Mobility Assistance: From long sitting, pt able to bring bil LE off EOB with UE's  Transfers Transfers: Not assessed Ambulation/Gait Ambulation/Gait Assistance: Not tested (comment)    Exercises Other Exercises Other Exercises: chin tucks x 10, 4 times per day Other Exercises: scapular adduction 10x, 4 times per day Other Exercises: lie with HOB less than 30degrees , 3x/day   PT Diagnosis:    PT  Problem List:   PT Treatment Interventions:     PT Goals Acute Rehab PT Goals PT Goal Formulation: With patient Time For Goal Achievement: 08/17/12 Potential to Achieve Goals: Good Pt will Roll Supine to Right Side: with modified independence PT Goal: Rolling Supine to Right Side - Progress: Progressing toward goal Pt will Roll Supine to Left Side: with modified independence PT Goal: Rolling Supine to Left Side - Progress: Progressing toward goal Pt will go Supine/Side to Sit: with supervision PT Goal: Supine/Side to Sit - Progress: Progressing toward goal Pt will Sit at [facility] of Bed: 3-5 min;with no upper extremity support;with min assist PT Goal: Sit at Edge Of Bed - Progress: Progressing toward goal  Visit Information  Last PT Received On: 08/17/12 Assistance Needed: +2 PT/OT Co-Evaluation/Treatment: Yes    Subjective Data      Cognition  Overall Cognitive Status: Appears within functional limits for tasks assessed/performed Arousal/Alertness: Awake/alert Orientation Level: Appears intact for tasks assessed Behavior During Session: [name] for tasks performed    Balance  Balance Balance Assessed: Yes Static Sitting Balance Static Sitting - Balance Support: Bilateral upper extremity supported Static Sitting - Level of Assistance: 5: Stand by assistance;4: Min assist Static Sitting - Comment/# of Minutes: Occasional min (A) due to right lean when pt was having abdominal pain and mm spasms Dynamic Sitting Balance Dynamic Sitting - Balance Support: Bilateral upper extremity supported Dynamic Sitting - Level of Assistance: 5: Stand by assistance Dynamic Sitting - Comments: ~5 minutes while performing weight shifts and arm push ups from bed  End of Session PT - End of Session Activity Tolerance: Patient tolerated  treatment well Patient left: in bed;with call bell/phone within reach;with nursing in room Nurse Communication: Mobility status   GP     [name] 08/17/2012,  7:08 AM Jake Shark, PT DPT [phone]

## 2012-08-17 NOTE — Progress Notes (Signed)
Clinical Social Worker staffed case with Chiropodist re: difficulty to place.  CSW staffed case with [facility] representative.  Lacinda Axon continues to "consider" pt.  CSW to continue to follow and assist as needed.   Angelia Mould, MSW, [place] [phone]

## 2012-08-18 NOTE — Consult Note (Signed)
Wound care follow-up:  Pt upset and tearful, does not want to go to a SNF, wants to go home.  When questioned regarding arranging home care, he states his mom does not want him to come home.  He does not understand why he cannot stay in the hospital until Dec 1, when his antibiotics are completed.  Attempted to explain that people go to SNF or home with IV antibiotics all the time, and this is not a valid reason to keep him in the hospital.   Vac dressing intact with good seal to sacrum wound.  Optimal plan of care to promote healing would be to continue vac to wound, whether with home health or at SNF.  Change 3X week using white foam to tunneling areas, and black foam over wound bed to cont suction.  Vac reading "blockage", flushed trac pad with NS and no longer reading blockage. Mod pink drainage in cannister.  Pt will need air mattress to reduce pressure.  Attempted to demonstrated pouch change and emptying.  Pt states, "don't take that off, I already know how to do that."  Declines offer of teaching session to make sure he knows technique for pouch application and emptying.  Supplies at bedside if patient discharges, more will need to be ordered wherever he ends up staying.  Stoma red and viable, pouch intact with good seal and mod semi-formed stool.  Areas of concern for pt if transferred to SNF include:  Will he continue to get PT for strengthening exercises?  He feels he has lost a significant amt of arm strength since his recent surgery which he need to transfer in and out of the wheelchair.  Will they let him leave the facility for short amounts of time during the day so he is not trapped in his room?  Will they be able to adequately care for his IV meds, colostomy, and vac dressing?  If these concerns are addressed, he will probably be more receptive to the transfer.  He admits he is very scared. Pt is stable from a wound care standpoint for transfer to either home health (if it could be  approved) or SNF.  Teaching included when to empty the pouch, length of wear time, limiting time up in the wheel chair to 2 hours at a time while sacral wound is healing, need for pressure reduction to wound area, and trouble shooting vac alarms. One hour spent in patients room discussing plan of care, educating, and listening to his concerns.   Cammie Mcgee, RN, MSN, [employer]  [phone]

## 2012-08-18 NOTE — Progress Notes (Signed)
Clinical Social Worker received phone call from [facility], they do not currently have any male beds available. CSW received phone call from [facility], they will not have any beds available until Monday. CSW has left two messages with [facility].  CSW to continue to follow and assist as needed.   Angelia Mould, MSW, [place] [phone]
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
## 2012-08-18 NOTE — Progress Notes (Signed)
This CSW received phone call from RN indicating pt requesting to speak with this CSW.  This CSW unable to visit with pt and phoned pt in his room.  Clinical Social Worker spoke with pt at length by phone.  Pt stated he did not want to speak by phone.  CSW offered for another CSW to visit with pt, pt refusing.  CSW reviewed SNF process at length and addressed questions and concerns pt was willing to discuss.  CSW staffed case with Director.  CSW to continue to follow and assist as needed.   Angelia Mould, MSW, [place] [phone]

## 2012-08-18 NOTE — Progress Notes (Signed)
Pt had 13 Bts NSVT.  Asymptomatic--K Craige Cotta NP on floor and notified-no new orders at this time. Strip posted and will cont to monitor. Dierdre Highman

## 2012-08-18 NOTE — Progress Notes (Signed)
Physical Therapy Note  Received call from our secretary that the [place] [facility] wants pt evaluated by PT today. Pt was evaluated 08/07/12 at [facility] and has remained on PT caseload since that time with last visit occuring 08/17/12. Pt continues on caseload at 2x/week while awaiting SNF placement.  Note: Pt was also being seen by PT for hydrotherapy, however as discussed with Cammie Mcgee, WOC RN, pt was discharged from hydrotherapy by her 08/17/12 after [facility] placed by PT.   08/18/2012 Veda Canning, PT Pager: [phone]

## 2012-08-19 DIAGNOSIS — E871 Hypo-osmolality and hyponatremia: Secondary | ICD-10-CM
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
## 2012-08-20 LAB — BASIC METABOLIC PANEL
BUN: 6 mg/dL (ref 6–23)
CO2: 29 mEq/L (ref 19–32)
Calcium: 8.4 mg/dL (ref 8.4–10.5)
GFR calc non Af Amer: >90 mL/min (ref >90)
Glucose, Bld: 125 mg/dL — ABNORMAL HIGH (ref 70–99)
Sodium: 132 mEq/L — ABNORMAL LOW (ref 135–145)

## 2012-08-20 LAB — CBC
HCT: 30.5 % — ABNORMAL LOW (ref 39.0–52.0)
Hemoglobin: 9.7 g/dL — ABNORMAL LOW (ref 13.0–17.0)
MCH: 28.4 pg (ref 26.0–34.0)
MCHC: 31.8 g/dL (ref 30.0–36.0)
RBC: 3.42 MIL/uL — ABNORMAL LOW (ref 4.22–5.81)

## 2012-08-20 MED ORDER — ALTEPLASE 2 MG IJ SOLR
2.0000 mg | Freq: Once | INTRAMUSCULAR | Status: AC
  Administered 2012-08-20: 2 mg
  Filled 2012-08-20: qty 2
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
## 2012-08-21 DIAGNOSIS — K4090 Unilateral inguinal hernia, without obstruction or gangrene, not specified as recurrent: Secondary | ICD-10-CM

## 2012-08-21 MED ORDER — PROMETHAZINE HCL 12.5 MG PO TABS: 12.5000 mg | ORAL_TABLET | Freq: Three times a day (TID) | ORAL | Status: DC

## 2012-08-21 MED ORDER — ASCORBIC ACID 500 MG PO TABS: 500.0000 mg | ORAL_TABLET | Freq: Two times a day (BID) | ORAL | Status: DC

## 2012-08-21 MED ORDER — MAGNESIUM OXIDE 400 (241.3 MG) MG PO TABS: 400.0000 mg | ORAL_TABLET | Freq: Every day | ORAL | Status: DC

## 2012-08-21 MED ORDER — CARVEDILOL 3.125 MG PO TABS: 3.1250 mg | ORAL_TABLET | Freq: Two times a day (BID) | ORAL | Status: DC

## 2012-08-21 MED ORDER — CEFAZOLIN SODIUM-DEXTROSE 2-3 GM-% IV SOLR: 2.0000 g | Freq: Three times a day (TID) | INTRAVENOUS | Status: DC

## 2012-08-21 MED ORDER — UNJURY CHICKEN SOUP POWDER: 8.0000 [oz_av] | Freq: Two times a day (BID) | ORAL | Status: DC

## 2012-08-21 MED ORDER — METHOCARBAMOL 500 MG PO TABS: 500.0000 mg | ORAL_TABLET | Freq: Three times a day (TID) | ORAL | Status: DC | PRN

## 2012-08-21 MED ORDER — NYSTATIN 100000 UNIT/ML MT SUSP: 5.0000 mL | Freq: Four times a day (QID) | OROMUCOSAL | Status: DC

## 2012-08-21 MED ORDER — ACETAMINOPHEN 325 MG PO TABS: 325.0000 mg | ORAL_TABLET | ORAL | Status: DC | PRN

## 2012-08-21 MED ORDER — HEPARIN SOD (PORK) LOCK FLUSH 100 UNIT/ML IV SOLN
250.0000 [IU] | INTRAVENOUS | Status: AC | PRN
  Administered 2012-08-21 (×2): 250 [IU]

## 2012-08-21 MED ORDER — POLYETHYLENE GLYCOL 3350 17 G PO PACK: 34.0000 g | PACK | Freq: Two times a day (BID) | ORAL | Status: DC

## 2012-08-21 MED ORDER — PANTOPRAZOLE SODIUM 40 MG PO TBEC: 40.0000 mg | DELAYED_RELEASE_TABLET | Freq: Two times a day (BID) | ORAL | Status: DC

## 2012-08-21 MED ORDER — ZOLPIDEM TARTRATE 5 MG PO TABS: 5.0000 mg | ORAL_TABLET | Freq: Every evening | ORAL | Status: DC | PRN

## 2012-08-21 MED ORDER — ZINC SULFATE 220 (50 ZN) MG PO CAPS: 220.0000 mg | ORAL_CAPSULE | Freq: Every day | ORAL | Status: DC

## 2012-08-21 MED ORDER — METRONIDAZOLE 500 MG PO TABS: 500.0000 mg | ORAL_TABLET | Freq: Three times a day (TID) | ORAL | Status: DC

## 2012-08-21 MED ORDER — ENSURE COMPLETE PO LIQD: 237.0000 mL | Freq: Two times a day (BID) | ORAL | Status: DC

## 2012-08-21 MED ORDER — K PHOS MONO-SOD PHOS DI & MONO 155-852-130 MG PO TABS: 2.0000 | ORAL_TABLET | Freq: Three times a day (TID) | ORAL | Status: DC

## 2012-08-21 MED ORDER — OXYCODONE HCL 5 MG PO TABS: 5.0000 mg | ORAL_TABLET | ORAL | Status: DC | PRN

## 2012-08-21 MED ORDER — ONDANSETRON HCL 4 MG/5ML PO SOLN: 4.0000 mg | Freq: Three times a day (TID) | ORAL | Status: DC

## 2012-08-21 MED ORDER — VITAMIN E 180 MG (400 UNIT) PO CAPS: 400.0000 [IU] | ORAL_CAPSULE | Freq: Every day | ORAL | Status: DC

## 2012-08-21 MED ORDER — RANITIDINE HCL 150 MG/10ML PO SYRP: 150.0000 mg | ORAL_SOLUTION | Freq: Two times a day (BID) | ORAL | Status: DC | PRN

## 2012-08-21 MED ORDER — OXYCODONE HCL ER 60 MG PO T12A: 60.0000 mg | EXTENDED_RELEASE_TABLET | Freq: Two times a day (BID) | ORAL | Status: DC

## 2012-08-21 MED ORDER — ADULT MULTIVITAMIN W/MINERALS CH: 1.0000 | ORAL_TABLET | Freq: Every day | ORAL | Status: DC

## 2012-08-21 NOTE — Progress Notes (Signed)
Clinical Child psychotherapist phoned several facilities in [place] and [place].  [facility] currently reviewing pt in their morning meeting.  CSW has left two messages with Lacinda Axon.  CSW phoned pt's [phone]).  CSW explained that options are becoming very limited and next option would be to pursue [facility] and [facility] SNFs.  Mother to phone [place] and [address]  CSW to phone [facility].  CSW encouraged mother to communicate with pt to encourage compliance with medical recommendations.  CSW to continue to follow and assist as needed.   Angelia Mould, MSW, [place] [phone]

## 2012-08-21 NOTE — Consult Note (Signed)
WOC ostomy consult  Stoma type/location: Colostomy to left lower quad.  Assessed stoma with CCS team present at bedside since pt c/o pain at stoma site. Refer to CCS note for more detailed assessment. Stomal assessment/size: 1 3/4 inch, red and viable, above skin level. Peristomal assessment: Intact skin surrounding, no breakdown Output 150cc semi-formed stool in pouch. Ostomy pouching: 2 piece Education provided: Demonstrated pouch change procedure.  Pt states, "I already know how to put one on."  Discussed emptying and pouching change frequency.  Supplies ordered to bedside for staff use.  Pt denies further questions at this time regarding ostomy.  He does not want to look at it or smell it, he states.  Plans to d/c to a SNF where he will have total assistance with pouching activities.  Cammie Mcgee, RN, MSN, [employer]  [phone]

## 2012-08-21 NOTE — Progress Notes (Signed)
[facility] Progress Note   10 Days Post-Op   Subjective:ssAssesment and plan  Complains today about ostomy pain. Pain only present when stool is coming out. Pain described as stabbing pain. No nausea, vomiting or other symptoms. Afebrile.   Objective:Assesment and plan   Vital signs in last 24 hours: Temp:  [97.7 F (36.5 C)-98.7 F (37.1 C)] 97.7 F (36.5 C) (11/11 0500) Pulse Rate:  [92-99] 93  (11/11 0500) Resp:  [16-20] 16  (11/11 0500) BP: (97-110)/(61-66) 110/66 mmHg (11/11 0500) SpO2:  [99 %-100 %] 99 % (11/11 0500) Last BM Date: 08/13/12  Intake/Output from previous day: 11/10 0701 - 11/11 0700 In: 1110 [P.O.:840; I.V.:270] Out: 1950 [Urine:1950] Intake/Output this shift:   PE: Abd: soft, no tender to palpation. Ostomy was changed in our presence and there was no erythema, or edema and was completely patent. Normal BS Wound: Per Wound nurse verbal report to [name] today. Dressing change today and there was pink granulation tissue present. Wound vac was placed back.  Lab Results:   Basename 08/20/12 0500  WBC 7.7  HGB 9.7*  HCT 30.5*  PLT 364   BMET  Basename 08/20/12 0500  NA 132*  K 3.8  CL 97  CO2 29  GLUCOSE 125*  BUN 6  CREATININE 0.47*  CALCIUM 8.4    CMP     Component Value Date/Time   NA 132* 08/20/2012 0500   K 3.8 08/20/2012 0500   CL 97 08/20/2012 0500   CO2 29 08/20/2012 0500   GLUCOSE 125* 08/20/2012 0500   BUN 6 08/20/2012 0500   CREATININE 0.47* 08/20/2012 0500   CALCIUM 8.4 08/20/2012 0500   PROT 6.2 08/16/2012 0500   ALBUMIN 1.3* 08/16/2012 0500   AST 10 08/16/2012 0500   ALT <5 08/16/2012 0500   ALKPHOS 97 08/16/2012 0500   BILITOT 0.2* 08/16/2012 0500   GFRNONAA >90 08/20/2012 0500   GFRAA >90 08/20/2012 0500    Anti-infectives: Anti-infectives     Start     Dose/Rate Route Frequency Ordered Stop   08/14/12 1800   metroNIDAZOLE (FLAGYL) tablet 500 mg        500 mg Oral 3 times daily 08/14/12 1637     08/13/12 1400   rifampin (RIFADIN) 300 mg in sodium chloride 0.9 % 100 mL IVPB  Status:  Discontinued        300 mg 200 mL/hr over 30 Minutes Intravenous 3 times per day 08/13/12 1218 08/14/12 1637   08/13/12 0800   metroNIDAZOLE (FLAGYL) IVPB 500 mg  Status:  Discontinued        500 mg 100 mL/hr over 60 Minutes Intravenous Every 8 hours 08/13/12 0735 08/14/12 1637   08/12/12 1500   ceFAZolin (ANCEF) IVPB 2 g/50 mL premix        2 g 100 mL/hr over 30 Minutes Intravenous 3 times per day 08/12/12 1405     08/11/12 2000   ceFAZolin (ANCEF) IVPB 2 g/50 mL premix        2 g 100 mL/hr over 30 Minutes Intravenous Every 8 hours 08/11/12 1632 08/12/12 1222   08/11/12 1313   gentamycin 80 mg in 0.9% normal saline 250 mL irrigation  Status:  Discontinued          As needed 08/11/12 1314 08/11/12 1400   08/11/12 0600   gentamicin (GARAMYCIN) 80 mg in sodium chloride irrigation 0.9 % 500 mL irrigation  Status:  Discontinued        80 mg  Irrigation On call 08/10/12 2109 08/11/12 1628   08/11/12 0600   ceFAZolin (ANCEF) IVPB 2 g/50 mL premix     Comments: Pharmacy to determine dosing based on previous orders      2 g 100 mL/hr over 30 Minutes Intravenous On call 08/10/12 2109 08/11/12 1245   08/09/12 1800   rifampin (RIFADIN) capsule 300 mg  Status:  Discontinued        300 mg Oral 3 times daily after meals 08/09/12 1645 08/13/12 1218   08/08/12 1800   gentamicin (GARAMYCIN) IVPB 80 mg  Status:  Discontinued        80 mg 100 mL/hr over 30 Minutes Intravenous Every 8 hours 08/08/12 1552 08/16/12 0926   08/08/12 1530   rifampin (RIFADIN) capsule 300 mg  Status:  Discontinued        300 mg Oral 3 times per day 08/08/12 1516 08/09/12 1645   08/03/12 2000   metroNIDAZOLE (FLAGYL) tablet 500 mg  Status:  Discontinued        500 mg Oral 3 times per day 08/03/12 1625 08/13/12 0735   08/03/12 1800   ceFAZolin (ANCEF) IVPB 2 g/50 mL premix  Status:  Discontinued        2 g 100 mL/hr over 30 Minutes  Intravenous 3 times per day 08/03/12 1625 08/11/12 1652   08/01/12 1300   meropenem (MERREM) 1 g in sodium chloride 0.9 % 100 mL IVPB  Status:  Discontinued        1 g 200 mL/hr over 30 Minutes Intravenous To Surgery 08/01/12 1039 08/01/12 1246   07/29/12 0100   meropenem (MERREM) 1 g in sodium chloride 0.9 % 100 mL IVPB  Status:  Discontinued        1 g 200 mL/hr over 30 Minutes Intravenous 3 times per day 07/29/12 0050 08/03/12 1625   07/26/12 0500   vancomycin (VANCOCIN) 750 mg in sodium chloride 0.9 % 150 mL IVPB  Status:  Discontinued        750 mg 150 mL/hr over 60 Minutes Intravenous Every 12 hours 07/25/12 1958 07/28/12 1033   07/26/12 0200   piperacillin-tazobactam (ZOSYN) IVPB 3.375 g  Status:  Discontinued        3.375 g 12.5 mL/hr over 240 Minutes Intravenous Every 8 hours 07/25/12 1958 07/29/12 0043   07/25/12 1700   vancomycin (VANCOCIN) IVPB 1000 mg/200 mL premix        1,000 mg 200 mL/hr over 60 Minutes Intravenous  Once 07/25/12 1619 07/25/12 1807   07/25/12 1630   piperacillin-tazobactam (ZOSYN) IVPB 3.375 g  Status:  Discontinued        3.375 g 12.5 mL/hr over 240 Minutes Intravenous  Once 07/25/12 1619 07/25/12 1938          Assessment and Plan:essAssesment and plan  36 y/o M s/p  Open diverting colostomy, left inguinal hernia repair and sacral wound debridement 10/22. - sacral wound improving. - no physical findings that correlates pt's pain. He needs to f/u with Dr. Carolynne Edouard as outpatient. - will sign off.    LOS: 27 days    PILOTO, DAYARMYS 08/21/2012, 11:49 AM

## 2012-08-21 NOTE — Progress Notes (Signed)
Clinical Social Worker met with pt and pt's mother at bedside.  CSW reviewed SNF options-[facility].  CSW addressed all questions and concerns.  CSW to continue to follow and assist as needed.  Angelia Mould, MSW, [place] [phone]

## 2012-08-21 NOTE — Progress Notes (Signed)
Clinical Social Worker received confirmation that [facility] would be able to accept pt today for ST-SNF.  CSW updated pt's mother who stated she will arrive at hospital shortly.  CSW Psychologist, counselling and completed LOG request.  CSW to continue to follow and assist as needed.   Angelia Mould, MSW, [place] [phone]

## 2012-08-21 NOTE — Progress Notes (Addendum)
Nutrition Follow-up  Intervention:   1. Continue current supplement regimen 2. RD to add snacks BID (cheese and crackers & [place] sandwich) per pt request 3. RD will continue to follow   Assessment:   Ensure Complete shakes resumed by MD on 11/5. Pt states that he is drinking at least 1 of these daily. Sometimes drinks 2. Discussed high protein needs with patient. Pt reports that he is consuming at most 50% of his meals.  Continues to receive hydrotherapy daily. Vac applied on 11/7. Continues to be followed by wound care RN. CSW continues to work on [facility], pt often refusing SNF.  Diet Order:  Regular Supplements: Ensure Complete PO BID; [place] Chicken Soup PO BID  Meds: Scheduled Meds:    . [COMPLETED] alteplase  2 mg Intracatheter Once  . calcium carbonate (dosed in mg elemental calcium)  1,000 mg of elemental calcium Oral BID   And  . cholecalciferol  400 Units Oral BID  . carvedilol  3.125 mg Oral BID WC  .  ceFAZolin (ANCEF) IV  2 g Intravenous Q8H  . enoxaparin (LOVENOX) injection  40 mg Subcutaneous Q24H  . feeding supplement  237 mL Oral BID BM  . ferrous gluconate  324 mg Oral BID  . magnesium oxide  400 mg Oral Daily  . metroNIDAZOLE  500 mg Oral TID  . multivitamin with minerals  1 tablet Oral Daily  . nystatin  5 mL Oral QID  . ondansetron  4 mg Oral TID  . oxybutynin  5 mg Oral TID  . OxyCODONE  60 mg Oral Q12H  . pantoprazole  40 mg Oral BID AC  . phosphorus  500 mg Oral TID  . polyethylene glycol  34 g Oral BID  . protein supplement  8 oz Oral BID  . simvastatin  40 mg Oral QHS  . sodium chloride  10-40 mL Intracatheter Q12H  . vitamin C  500 mg Oral BID  . vitamin E  400 Units Oral Daily  . zinc sulfate  220 mg Oral Daily   Continuous Infusions:  PRN Meds:.sodium chloride, acetaminophen, alum & mag hydroxide-simeth, HYDROmorphone (DILAUDID) injection, methocarbamol, ondansetron (ZOFRAN) IV, oxyCODONE, phenol, promethazine, ranitidine, sodium chloride,  zolpidem   CMP     Component Value Date/Time   NA 132* 08/20/2012 0500   K 3.8 08/20/2012 0500   CL 97 08/20/2012 0500   CO2 29 08/20/2012 0500   GLUCOSE 125* 08/20/2012 0500   BUN 6 08/20/2012 0500   CREATININE 0.47* 08/20/2012 0500   CALCIUM 8.4 08/20/2012 0500   PROT 6.2 08/16/2012 0500   ALBUMIN 1.3* 08/16/2012 0500   AST 10 08/16/2012 0500   ALT <5 08/16/2012 0500   ALKPHOS 97 08/16/2012 0500   BILITOT 0.2* 08/16/2012 0500   GFRNONAA >90 08/20/2012 0500   GFRAA >90 08/20/2012 0500   Prealbumin  Date/Time Value Range Status  08/01/2012  5:13 AM 5.3* 17.0 - 34.0 mg/dL Final    CBG (last 3)  No results found for this basename: GLUCAP:3 in the last 72 hours   Intake/Output Summary (Last 24 hours) at 08/21/12 1149 Last data filed at 08/21/12 0600  Gross per 24 hour  Intake   1110 ml  Output   1050 ml  Net     60 ml  BM 11/3  Weight Status:  198 lbs, no new wt since 11/5  Re-estimated needs:  [phone] kcal, 120-145 gm protein   Nutrition Dx:  Inadequate oral intake r/t decreased appetite and  increased needs AEB pt report and wound. Ongoing  Goal: Intake of >90% meals/supplements. --unmet   Monitor:  PO intake, weight, labs  Jarold Motto MS, [place], LDN Pager: [phone] After-hours pager: [phone]

## 2012-08-21 NOTE — Progress Notes (Signed)
Occupational Therapy Treatment Patient Details Name: [name] [name] MRN: [id number] DOB: 1976-05-22 Today's Date: 08/21/2012 Time: 1191-4782 OT Time Calculation (min): 18 min  OT Assessment / Plan / Recommendation Comments on Treatment Session pt limited today by ostomy pain- RN aware    Follow Up Recommendations  SNF    Barriers to Discharge       Equipment Recommendations  None recommended by PT;None recommended by OT    Recommendations for Other Services    Frequency     Plan Discharge plan remains appropriate    Precautions / Restrictions Precautions Precautions: Fall Precaution Comments: full thickness sacral wound; abd incision, new colostomy, new VAC dressing Restrictions RLE Weight Bearing: Non weight bearing LLE Weight Bearing: Non weight bearing   Pertinent Vitals/Pain Pt reports increased ostomy pain- RN aware.    ADL  Grooming: Performed;Set up Where Assessed - Grooming:  (long sitting) ADL Comments: pt refused any mobility today- therefore perfomed exercises    OT Diagnosis:    OT Problem List:   OT Treatment Interventions:     OT Goals ADL Goals Pt Will Transfer to Toilet: with mod assist;with transfer board Arm Goals Pt Will Complete Theraband Exer: Independently;Bilateral upper extremities;to maintain strength;15 reps;3 sets;Level 4 Theraband Miscellaneous OT Goals Miscellaneous OT Goal #1: Pt will tolerate unsupported sitting EOB x8 min with SBA in prep for seated ADL. Miscellaneous OT Goal #2: Pt will complete transfers from bed<>chair/drop arm 3:1/w/c using boost and scoot/slide board method with min A.  Visit Information  Last OT Received On: 08/21/12 Assistance Needed: +2    Subjective Data      Prior Functioning       Cognition  Overall Cognitive Status: Appears within functional limits for tasks assessed/performed Arousal/Alertness: Awake/alert Orientation Level: Appears intact for tasks assessed Behavior During Session: [facility]  for tasks performed    Mobility  Shoulder Instructions         Exercises  Other Exercises Other Exercises: chin tucks x 10, 4 times per day Other Exercises: scapular adduction 10x, 4 times per day Other Exercises: lie with HOB less than 30degrees , 3x/day (encouraged this) Other Exercises: bil UE against gravity exercises (forward flexion; horizontal adduction/abduction; elbow flexion/extension)   Balance     End of Session OT - End of Session Activity Tolerance: Patient limited by pain Patient left: in bed;with call bell/phone within reach  GO     [name] [name] 08/21/2012, 4:07 PM

## 2012-08-21 NOTE — Progress Notes (Signed)
Agree with above 
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
## 2012-08-21 NOTE — Progress Notes (Signed)
Pt got discharged today to [facility], pt still connected to the wound vac, called attending physician for instructions and she said that he needs it. Called the social worker on call but before we got any response about it the patient was taken by [facility]. Jody from the Child psychotherapist said he will leave a note for [employer]

## 2012-08-21 NOTE — Consult Note (Signed)
Wound care follow-up:  Vac dressing changed.  Mod amt thick tan drainage in cannister.  Pt medicated for pain prior to procedure and tolerated with minimal discomfort.  Wound bed beefy red, no odor, granulation begging to appear at wound edges.  One piece white foam applied to tunneling area on upper wound edge, then one piece black foam to cont suction.  Applied track pad near previous rectal area per patient request, there will not be pressure to skin from track pad because he states he prefers tubing to run down between his legs.  Pt awaiting SNF placement.  Vac will need to be changed 3X week and pt will need colostomy supplies and air mattress.  He declines offer to change colostomy pouch at this time and requests WOC nurse return later.  Vac intact with good seal.  Cammie Mcgee, RN, MSN, [employer]  [phone]

## 2012-08-25 ENCOUNTER — Telehealth: Payer: Self-pay | Admitting: *Deleted

## 2012-08-25 NOTE — Telephone Encounter (Signed)
Clinical Social Worker received telephone call from pt's mother who stated she was upset with the level of care pt was receiving at his SNF.  Pt's mother stated, "You put him there, so you need to find him a new one".  CSW validated mother's feelings and acknolwedged the difficulty of having a loved one out of the home.  CSW provided resource information to [facility] for mother to file a complaint and encouraged pt's mother to discuss concerns with facility-mother states she has not voiced concerns with SNF yet.  CSW encouraged mother to speak with Lenise Arena to locate additional resources in the community to assist with pt's needed.  CSW explained that this CSW can not assist with another placement at this pt is no longer a pt of this hospital and this CSW does not have access to pt's current medical records.  Mother thanked CSW for intervention.    Angelia Mould, MSW, [place] [phone]

## 2012-08-28 ENCOUNTER — Ambulatory Visit: Payer: Self-pay

## 2012-08-31 ENCOUNTER — Ambulatory Visit (INDEPENDENT_AMBULATORY_CARE_PROVIDER_SITE_OTHER): Payer: Medicaid Other | Admitting: *Deleted

## 2012-08-31 DIAGNOSIS — I428 Other cardiomyopathies: Secondary | ICD-10-CM

## 2012-08-31 NOTE — Progress Notes (Signed)
Wound check for explant of ICD. Sutures removed. Dr Johney Frame evaluated site. Site well healed with no redness or swelling.

## 2012-09-05 ENCOUNTER — Inpatient Hospital Stay: Payer: Medicaid Other | Admitting: Internal Medicine

## 2012-09-06 ENCOUNTER — Ambulatory Visit (INDEPENDENT_AMBULATORY_CARE_PROVIDER_SITE_OTHER): Payer: Medicaid Other | Admitting: Internal Medicine

## 2012-09-06 ENCOUNTER — Encounter: Payer: Self-pay | Admitting: Internal Medicine

## 2012-09-06 VITALS — BP 104/68 | HR 102 | Temp 98.0°F | Ht 74.0 in | Wt 178.0 lb

## 2012-09-06 DIAGNOSIS — Z0000 Encounter for general adult medical examination without abnormal findings: Secondary | ICD-10-CM

## 2012-09-06 DIAGNOSIS — M869 Osteomyelitis, unspecified: Secondary | ICD-10-CM

## 2012-09-06 NOTE — Progress Notes (Addendum)
Patient ID: [name] [name], male   DOB: 1976/03/19, 36 y.o.   MRN: [id number]    [facility] for Infectious Disease  Patient Active Problem List  Diagnosis  . SIRS (systemic inflammatory response syndrome)  . Sacral decubitus ulcer, stage IV, s/p debridement & colostomy diversion  . Paraplegia following T10spinal cord injury  . Anemia  . Cardiomyopathy  . Hyperkalemia  . Acute renal failure  . Sepsis  . History of frequent urinary tract infections  . Inguinal hernia, left  . Septic shock(785.52)  . Staphylococcus aureus bacteremia with sepsis  . UTI (lower urinary tract infection)  . Osteomyelitis of pelvic region  . ICD (implantable cardiac defibrillator) infection    Patient's Medications  New Prescriptions   No medications on file  Previous Medications   ACETAMINOPHEN (TYLENOL) 325 MG TABLET    Take 1-2 tablets (325-650 mg total) by mouth every 4 (four) hours as needed.   CALCIUM-VITAMIN D (OSCAL WITH D) 500-200 MG-UNIT PER TABLET    Take 2 tablets by mouth 2 (two) times daily.   CARVEDILOL (COREG) 3.125 MG TABLET    Take 1 tablet (3.125 mg total) by mouth 2 (two) times daily with a meal.   CEFAZOLIN (ANCEF) 2-3 GM-% SOLR    Inject 50 mLs (2 g total) into the vein every 8 (eight) hours. Stop date on Dec 1st, 2013   COLLAGENASE [facility]) OINTMENT    Apply 1 application topically daily as needed. For wound care.  Applied to necrotic tissue.   FEEDING SUPPLEMENT (ENSURE COMPLETE) LIQD    Take 237 mLs by mouth 2 (two) times daily between meals.   FENTANYL (DURAGESIC - DOSED MCG/HR) 50 MCG/HR    Place 1 patch onto the skin every 3 (three) days.   FERROUS GLUCONATE (FERGON) 324 MG TABLET    Take 324 mg by mouth 2 (two) times daily.   MAGNESIUM OXIDE (MAG-OX) 400 (241.3 MG) MG TABLET    Take 1 tablet (400 mg total) by mouth daily.   METHOCARBAMOL (ROBAXIN) 500 MG TABLET    Take 1 tablet (500 mg total) by mouth every 8 (eight) hours as needed.   METRONIDAZOLE (FLAGYL) 500 MG  TABLET    Take 1 tablet (500 mg total) by mouth 3 (three) times daily. Stop date on Dec1st   MULTIPLE VITAMIN (MULTIVITAMIN WITH MINERALS) TABS    Take 1 tablet by mouth daily.   NYSTATIN (MYCOSTATIN) 100000 UNIT/ML SUSPENSION    Take 5 mLs (500,000 Units total) by mouth 4 (four) times daily.   ONDANSETRON (ZOFRAN) 4 MG/5ML SOLUTION    Take 5 mLs (4 mg total) by mouth 3 (three) times daily.   PANTOPRAZOLE (PROTONIX) 40 MG TABLET    Take 1 tablet (40 mg total) by mouth 2 (two) times daily before a meal.   POLYETHYLENE GLYCOL (MIRALAX / GLYCOLAX) PACKET    Take 34 g by mouth 2 (two) times daily.   PROTEIN SUPPLEMENT (UNJURY CHICKEN SOUP) POWD    Take 27 g (8 oz total) by mouth 2 (two) times daily at 10 AM and 5 PM.   RANITIDINE (ZANTAC) 150 MG/10ML SYRUP    Take 10 mLs (150 mg total) by mouth 2 (two) times daily as needed for heartburn.   SIMVASTATIN (ZOCOR) 40 MG TABLET    Take 40 mg by mouth every evening.   VITAMIN C (VITAMIN C) 500 MG TABLET    Take 1 tablet (500 mg total) by mouth 2 (two) times daily.   VITAMIN E  400 UNIT CAPSULE    Take 1 capsule (400 Units total) by mouth daily.   ZINC SULFATE 220 MG CAPSULE    Take 1 capsule (220 mg total) by mouth daily.   ZOLPIDEM (AMBIEN) 5 MG TABLET    Take 1-2 tablets (5-10 mg total) by mouth at bedtime as needed for sleep.  Modified Medications   No medications on file  Discontinued Medications   OXYBUTYNIN (DITROPAN) 5 MG TABLET    Take 5 mg by mouth 3 (three) times daily.   OXYCODONE (OXY IR/ROXICODONE) 5 MG IMMEDIATE RELEASE TABLET    Take 1-2 tablets (5-10 mg total) by mouth every 3 (three) hours as needed for pain.   OXYCODONE 60 MG T12A    Take 60 mg by mouth every 12 (twelve) hours.   PHOSPHORUS (K PHOS NEUTRAL) 155-852-130 MG TABLET    Take 2 tablets by mouth 3 (three) times daily.    Subjective: [name] [name] is in for his hospital followup visit. He was hospitalized in October with MSSA bacteremia and sepsis. He developed a sacral  decubitus with sacral osteomyelitis. He had an implanted defibrillator with a large vegetation on the ventricular lead. The defibrillator was removed and not replaced. He did have followup blood cultures on October 17 which had become negative within 48 hours of starting therapy. He is due to completely 6 weeks of IV antibiotic therapy on December 1. He has had no problems tolerating his PICC line or IV cefazolin. He is also on oral metronidazole. He tells me that the nurses at his skilled nursing facility have told him that his wound is decreasing in size. He has a VAC dressing that is changed every 72 hours. He states that he was changed to a fentanyl patch yesterday and that he is having significant pain.  Objective: Temp: 98 F (36.7 C) (11/27 1358) Temp src: Oral (11/27 1358) BP: 104/68 mmHg (11/27 1358) Pulse Rate: 102  (11/27 1358)  General: He appears uncomfortable sitting in wheelchair Skin: Right arm PICC site appears normal Lungs: Clear Cor: Regular S1 and S2 no murmurs. 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 I will plan on stopping antibiotics on December 1 but will repeat his inflammatory markers today before making a final decision.  Plan: 1. Repeat sedimentation rate and C-reactive protein 2. Consider stopping antibiotics on December 1   [name] Asters, MD [facility] for Infectious Disease [facility] [phone] pager   [phone] cell 09/06/2012, 2:21 PM   Addendum:  Lab Results  Component Value Date   CRP 5.0* 09/06/2012    Lab Results  Component Value Date   ESRSEDRATE 120* 09/06/2012    His  inflammatory markers have improved slightly. Unfortunately these are quite nonspecific given his open wound. I will go ahead with the decision to stop antibiotics with a plan to continue wound care.  [name] Asters, MD [facility] for Infectious Disease [facility] [phone] pager   [phone] cell 09/12/2012, 12:05 PM

## 2012-09-07 LAB — SEDIMENTATION RATE: Sed Rate: 120 mm/hr — ABNORMAL HIGH (ref 0–16)

## 2012-09-13 ENCOUNTER — Inpatient Hospital Stay: Payer: Self-pay | Admitting: Infectious Diseases

## 2012-09-14 ENCOUNTER — Inpatient Hospital Stay: Payer: Self-pay | Admitting: Internal Medicine

## 2012-09-14 ENCOUNTER — Ambulatory Visit (INDEPENDENT_AMBULATORY_CARE_PROVIDER_SITE_OTHER): Payer: Medicaid Other | Admitting: General Surgery

## 2012-09-14 ENCOUNTER — Encounter (INDEPENDENT_AMBULATORY_CARE_PROVIDER_SITE_OTHER): Payer: Self-pay | Admitting: General Surgery

## 2012-09-14 VITALS — BP 102/60 | HR 102 | Temp 96.6°F | Resp 18 | Ht 74.0 in

## 2012-09-14 DIAGNOSIS — Z933 Colostomy status: Secondary | ICD-10-CM | POA: Insufficient documentation

## 2012-09-14 NOTE — Patient Instructions (Signed)
You will need to follow up at the wound center for wound care

## 2012-09-14 NOTE — Progress Notes (Signed)
Subjective:     Patient ID: [name] [name], male   DOB: 01-25-1976, 36 y.o.   MRN: [id number]  HPI The patient is a 36 year old black male who is paraplegic. We saw him recently in the hospital with a sacral decubitus wound. We gave him a diverting ostomy to try to help the wound heal. From an abdominal standpoint he has been doing well. He only complains of minor soreness in his abdominal area. His ostomy is pink and productive.  Review of Systems     Objective:   Physical Exam On exam his abdomen is soft and nontender. His midline incision has healed nicely with no sign of infection. His ostomy is pink and productive.    Assessment:     The patient is now 5 weeks status post diverting ostomy    Plan:     At this point I believe he can return to his normal activities without any restrictions. He'll need to followup with the wound care center to manage his sacral decubitus. We will also try to help him get [facility] to help manage his medical issues. He can return to see [name] on a when necessary basis

## 2012-09-18 ENCOUNTER — Encounter (HOSPITAL_BASED_OUTPATIENT_CLINIC_OR_DEPARTMENT_OTHER): Payer: Medicaid Other | Attending: Plastic Surgery

## 2012-09-18 DIAGNOSIS — L98499 Non-pressure chronic ulcer of skin of other sites with unspecified severity: Secondary | ICD-10-CM | POA: Insufficient documentation

## 2012-09-18 DIAGNOSIS — Z933 Colostomy status: Secondary | ICD-10-CM | POA: Insufficient documentation

## 2012-09-18 DIAGNOSIS — Z79899 Other long term (current) drug therapy: Secondary | ICD-10-CM | POA: Insufficient documentation

## 2012-09-18 DIAGNOSIS — G8220 Paraplegia, unspecified: Secondary | ICD-10-CM | POA: Insufficient documentation
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
## 2012-09-26 ENCOUNTER — Encounter (HOSPITAL_COMMUNITY): Payer: Self-pay

## 2012-09-26 ENCOUNTER — Emergency Department (INDEPENDENT_AMBULATORY_CARE_PROVIDER_SITE_OTHER)
Admission: EM | Admit: 2012-09-26 | Discharge: 2012-09-26 | Disposition: A | Discharge status: Home / Self Care | Payer: Medicaid Other | Source: Home / Self Care

## 2012-09-26 DIAGNOSIS — L89154 Pressure ulcer of sacral region, stage 4: Secondary | ICD-10-CM

## 2012-09-26 DIAGNOSIS — G8220 Paraplegia, unspecified: Secondary | ICD-10-CM

## 2012-09-26 DIAGNOSIS — L89109 Pressure ulcer of unspecified part of back, unspecified stage: Secondary | ICD-10-CM

## 2012-09-26 DIAGNOSIS — M869 Osteomyelitis, unspecified: Secondary | ICD-10-CM

## 2012-09-26 DIAGNOSIS — T827XXA Infection and inflammatory reaction due to other cardiac and vascular devices, implants and grafts, initial encounter: Secondary | ICD-10-CM

## 2012-09-26 DIAGNOSIS — A4101 Sepsis due to Methicillin susceptible Staphylococcus aureus: Secondary | ICD-10-CM

## 2012-09-26 DIAGNOSIS — N390 Urinary tract infection, site not specified: Secondary | ICD-10-CM

## 2012-09-26 DIAGNOSIS — D649 Anemia, unspecified: Secondary | ICD-10-CM

## 2012-09-26 DIAGNOSIS — I428 Other cardiomyopathies: Secondary | ICD-10-CM

## 2012-09-26 DIAGNOSIS — I429 Cardiomyopathy, unspecified: Secondary | ICD-10-CM

## 2012-09-26 DIAGNOSIS — Z933 Colostomy status: Secondary | ICD-10-CM

## 2012-09-26 MED ORDER — ASCORBIC ACID 500 MG PO TABS: 500.0000 mg | ORAL_TABLET | Freq: Two times a day (BID) | ORAL | Status: DC

## 2012-09-26 MED ORDER — POLYETHYLENE GLYCOL 3350 17 G PO PACK: 34.0000 g | PACK | Freq: Two times a day (BID) | ORAL | Status: DC

## 2012-09-26 MED ORDER — PANTOPRAZOLE SODIUM 40 MG PO TBEC: 40.0000 mg | DELAYED_RELEASE_TABLET | Freq: Two times a day (BID) | ORAL | Status: DC

## 2012-09-26 MED ORDER — OXYCODONE HCL 5 MG PO TABS: 5.0000 mg | ORAL_TABLET | Freq: Three times a day (TID) | ORAL | Status: DC | PRN

## 2012-09-26 MED ORDER — SIMVASTATIN 40 MG PO TABS: 40.0000 mg | ORAL_TABLET | Freq: Every evening | ORAL | Status: DC

## 2012-09-26 MED ORDER — ZINC SULFATE 220 (50 ZN) MG PO CAPS: 220.0000 mg | ORAL_CAPSULE | Freq: Every day | ORAL | Status: DC

## 2012-09-26 MED ORDER — ENSURE COMPLETE PO LIQD: 237.0000 mL | Freq: Two times a day (BID) | ORAL | Status: DC

## 2012-09-26 MED ORDER — CARVEDILOL 3.125 MG PO TABS: 3.1250 mg | ORAL_TABLET | Freq: Two times a day (BID) | ORAL | Status: DC

## 2012-09-26 MED ORDER — FERROUS GLUCONATE 324 (38 FE) MG PO TABS: 324.0000 mg | ORAL_TABLET | Freq: Two times a day (BID) | ORAL | Status: DC

## 2012-09-26 MED ORDER — ASPIRIN 81 MG PO TBEC: 81.0000 mg | DELAYED_RELEASE_TABLET | Freq: Every day | ORAL | Status: DC

## 2012-09-26 MED ORDER — VITAMIN E 180 MG (400 UNIT) PO CAPS: 400.0000 [IU] | ORAL_CAPSULE | Freq: Every day | ORAL | Status: DC

## 2012-09-26 MED ORDER — MAGNESIUM OXIDE 400 (241.3 MG) MG PO TABS: 400.0000 mg | ORAL_TABLET | Freq: Every day | ORAL | Status: DC

## 2012-09-26 MED ORDER — FENTANYL 50 MCG/HR TD PT72: 1.0000 | MEDICATED_PATCH | TRANSDERMAL | Status: DC

## 2012-09-26 NOTE — ED Notes (Signed)
Patient states recently released from [facility] (09/18/12) and currently is running low or is out of current medicines
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
## 2012-10-18 ENCOUNTER — Ambulatory Visit: Payer: Medicaid Other | Admitting: Internal Medicine

## 2012-10-23 ENCOUNTER — Ambulatory Visit (INDEPENDENT_AMBULATORY_CARE_PROVIDER_SITE_OTHER): Payer: Medicaid Other | Admitting: Internal Medicine

## 2012-10-23 ENCOUNTER — Encounter: Payer: Self-pay | Admitting: Internal Medicine

## 2012-10-23 ENCOUNTER — Encounter (HOSPITAL_BASED_OUTPATIENT_CLINIC_OR_DEPARTMENT_OTHER): Payer: Medicaid Other | Attending: Plastic Surgery

## 2012-10-23 VITALS — BP 106/64 | HR 105 | Temp 98.1°F

## 2012-10-23 DIAGNOSIS — R509 Fever, unspecified: Secondary | ICD-10-CM | POA: Insufficient documentation

## 2012-10-23 DIAGNOSIS — T827XXA Infection and inflammatory reaction due to other cardiac and vascular devices, implants and grafts, initial encounter: Secondary | ICD-10-CM

## 2012-10-23 NOTE — Progress Notes (Signed)
Patient ID: [name] [name], male   DOB: [birth year], 37 y.o.   MRN: [id number]    [facility]  Patient Active Problem List  Diagnosis  . Sacral decubitus ulcer, stage IV, s/p debridement & colostomy diversion  . Paraplegia following T10spinal cord injury  . Anemia  . Cardiomyopathy  . Acute renal failure  . History of frequent urinary tract infections  . Inguinal hernia, left  . Staphylococcus aureus bacteremia with sepsis  . Osteomyelitis of pelvic region  . ICD (implantable cardiac defibrillator) infection  . Colostomy status  . Fever and chills    Patient's Medications  New Prescriptions   No medications on file  Previous Medications   ACETAMINOPHEN (TYLENOL) 325 MG TABLET    Take 1-2 tablets (325-650 mg total) by mouth every 4 (four) hours as needed.   ASCORBIC ACID (VITAMIN C) 500 MG TABLET    Take 1 tablet (500 mg total) by mouth 2 (two) times daily.   ASPIRIN 81 MG EC TABLET    Take 1 tablet (81 mg total) by mouth daily. Swallow whole.   CARVEDILOL (COREG) 3.125 MG TABLET    Take 1 tablet (3.125 mg total) by mouth 2 (two) times daily with a meal.   COLLAGENASE (SANTYL) OINTMENT    Apply 1 application topically daily as needed. For wound care.  Applied to necrotic tissue.   FEEDING SUPPLEMENT (ENSURE COMPLETE) LIQD    Take 237 mLs by mouth 2 (two) times daily between meals.   FENTANYL (DURAGESIC - DOSED MCG/HR) 50 MCG/HR    Place 1 patch (50 mcg total) onto the skin every 3 (three) days.   FERROUS GLUCONATE (FERGON) 324 MG TABLET    Take 1 tablet (324 mg total) by mouth 2 (two) times daily.   MAGNESIUM OXIDE (MAG-OX) 400 (241.3 MG) MG TABLET    Take 1 tablet (400 mg total) by mouth daily.   METHOCARBAMOL (ROBAXIN) 500 MG TABLET    Take 1 tablet (500 mg total) by mouth every 8 (eight) hours as needed.   MULTIPLE VITAMIN (MULTIVITAMIN WITH MINERALS) TABS    Take 1 tablet by mouth daily.   ONDANSETRON (ZOFRAN) 4 MG/5ML SOLUTION    Take 5 mLs (4 mg  total) by mouth 3 (three) times daily.   OXYCODONE (ROXICODONE) 5 MG IMMEDIATE RELEASE TABLET    Take 1 tablet (5 mg total) by mouth every 8 (eight) hours as needed for pain.   PANTOPRAZOLE (PROTONIX) 40 MG TABLET    Take 1 tablet (40 mg total) by mouth 2 (two) times daily before a meal.   POLYETHYLENE GLYCOL (MIRALAX / GLYCOLAX) PACKET    Take 34 g by mouth 2 (two) times daily.   SIMVASTATIN (ZOCOR) 40 MG TABLET    Take 1 tablet (40 mg total) by mouth every evening.   VITAMIN E 400 UNIT CAPSULE    Take 1 capsule (400 Units total) by mouth daily.   ZINC SULFATE 220 MG CAPSULE    Take 1 capsule (220 mg total) by mouth daily.  Modified Medications   No medications on file  Discontinued Medications   No medications on file    Subjective: [name] [name] is in for his routine followup visit. 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 The defibrillator was removed and he was treated with 6 weeks of IV ceftriaxone, completing therapy on December 1. His course was also complicated by an  infected sacral decubitus. He underwent diverting colostomy. He states that his colostomy is functioning well and his abdominal incision and left upper chest defibrillator site incision had healed. He has a VAC dressing on his sacral wound which she says has been working. He has been followed [facility] and by his home nurse. He has been told that the size of the wound is decreasing. He believes the plan is to be referred to [facility] for possible wound closure.  Over the past 4 days he has developed some low-grade fevers to 101.5 along with some chills. His also had some low abdominal pain andcloudy and foul-smelling urine compatible with previous urinary tract infections.  He still has his right arm PICC in place. He is being flushed regularly. He states that he plans to keep that in in case  he needs it for upcoming surgery to close his sacral wound.  Objective: Temp: 98.1 F (36.7 C) (01/13 1530) Temp src: Oral (01/13 1530) BP: 106/64 mmHg (01/13 1530) Pulse Rate: 105  (01/13 1530)  General: he is sitting in his wheelchair. He is talkative and in no distress Skin: his left upper chest wound and midline lower abdominal wound have healed. His left lower quadrant colostomy site appears normal. He has a VAC dressing on his sacral wound. Lungs: clear Cor: regular S1 and S2 no murmurs Abdomen: soft and no obvious tendernerness  Assessment: Overall, [name] [name] seems to be improving. I am hopeful that his MSSA bacteremia has been cured with removal of his defibrillator in 6 weeks of IV antibiotic therapy. I suspect that his recent low-grade fevers and chills are related to a urinary tract infection but will repeat blood cultures and urine cultures before making a decision about antibiotic therapy.  Plan: 1. Observe off of antibiotics for now 2. Check CBC, complete metabolic panel, blood cultures, urinalysis and urine culture 3. Followup with him by phone in 48 hours 4. Review situation with Dr. Kelly Splinter at the [facility] 5. Leave the PICC in place pending above results 6. Follow up here within 2 weeks   Cliffton Asters, MD [facility] [phone] pager   [phone] cell 10/23/2012, 3:56 PM

## 2012-10-24 ENCOUNTER — Telehealth: Payer: Self-pay | Admitting: *Deleted

## 2012-10-24 ENCOUNTER — Other Ambulatory Visit: Payer: Self-pay | Admitting: *Deleted

## 2012-10-24 DIAGNOSIS — R110 Nausea: Secondary | ICD-10-CM

## 2012-10-24 LAB — URINALYSIS, MICROSCOPIC ONLY
Casts: NONE SEEN
Crystals: NONE SEEN

## 2012-10-24 LAB — CBC
Hemoglobin: 9.4 g/dL — ABNORMAL LOW (ref 13.0–17.0)
MCH: 26.1 pg (ref 26.0–34.0)
MCHC: 31.3 g/dL (ref 30.0–36.0)
RDW: 13.7 % (ref 11.5–15.5)

## 2012-10-24 LAB — COMPREHENSIVE METABOLIC PANEL
Alkaline Phosphatase: 113 U/L (ref 39–117)
BUN: 8 mg/dL (ref 6–23)
Glucose, Bld: 99 mg/dL (ref 70–99)
Sodium: 136 mEq/L (ref 135–145)
Total Bilirubin: 0.4 mg/dL (ref 0.3–1.2)
Total Protein: 7.5 g/dL (ref 6.0–8.3)

## 2012-10-24 LAB — URINALYSIS, ROUTINE W REFLEX MICROSCOPIC
Glucose, UA: NEGATIVE mg/dL
Nitrite: NEGATIVE
pH: 6 (ref 5.0–8.0)

## 2012-10-24 MED ORDER — ONDANSETRON HCL 4 MG/5ML PO SOLN: 4.0000 mg | Freq: Three times a day (TID) | ORAL | Status: DC

## 2012-10-24 NOTE — Telephone Encounter (Signed)
Please call [name] [name] and let him know that the urine culture results should be available tomorrow by mid day. Asked him if he needs a refill on Zofran. If so, please ask them for what pharmacy he uses and give him one refill. He may also use acetaminophen to control his fever. I will call him tomorrow when I have the urine culture results.

## 2012-10-24 NOTE — Telephone Encounter (Signed)
Patient notified and Rx called to [facility]. [name] [name]

## 2012-10-24 NOTE — Telephone Encounter (Signed)
Mary at [facility] called to report that patient had a fever this AM of 101.3 and is c/o nausea, pain, and decreased appetite. Verbal order given for dressing change. Please advise Wendall Mola CMA

## 2012-10-25 ENCOUNTER — Telehealth: Payer: Self-pay | Admitting: *Deleted

## 2012-10-25 DIAGNOSIS — N390 Urinary tract infection, site not specified: Secondary | ICD-10-CM

## 2012-10-25 LAB — URINE CULTURE

## 2012-10-25 MED ORDER — DEXTROSE 5 % IV SOLN: 1.0000 g | Freq: Two times a day (BID) | INTRAVENOUS | Status: DC

## 2012-10-25 NOTE — Telephone Encounter (Signed)
Pt stated that he experienced chills, sweating and believes he continues to have a fever.  Lower abdominal pain.  Urine continues to be cloudy and "stinking" per the patient.  RN advised the pt that Dr. Orvan Falconer was restarting IV antibiotics and that [facility] will be delivering supplies and medication.

## 2012-10-27 ENCOUNTER — Ambulatory Visit (INDEPENDENT_AMBULATORY_CARE_PROVIDER_SITE_OTHER): Payer: Medicaid Other | Admitting: Cardiovascular Disease

## 2012-10-27 ENCOUNTER — Encounter: Payer: Self-pay | Admitting: Cardiovascular Disease

## 2012-10-27 VITALS — BP 96/64 | HR 96 | Ht 74.0 in | Wt 198.0 lb

## 2012-10-27 DIAGNOSIS — I5022 Chronic systolic (congestive) heart failure: Secondary | ICD-10-CM

## 2012-10-27 DIAGNOSIS — I509 Heart failure, unspecified: Secondary | ICD-10-CM

## 2012-10-27 NOTE — Assessment & Plan Note (Signed)
[name] [name] has a history of ischemic cardiopathy. He's had 2 heart attacks in the past. He has an ejection fraction of 25-30%.  He is on low-dose carvedilol. His blood pressure has been running low for years. He had an ICD placed but this is been removed because of endocarditis involving the  ICD lead.  He needs to have reconstructive plastic surgery to correct his large sacral decubitus ulcer. Plan is for him to have a muscle flap.  I would put him at moderate risk for his upcoming surgery. He does have a history of congestive heart failure but he seems to be well compensated at this time. He denies any PND or orthopnea. He denies any leg edema. I've advised him to quit smoking. I've advised him to avoid eating any extra salt.  I'll see him again in 2 months. We'll try to  increase his carvedilol at that time.
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
## 2012-10-27 NOTE — Patient Instructions (Addendum)
Your physician recommends that you continue on your current medications as directed. Please refer to the Current Medication list given to you today.  Your physician recommends that you schedule a follow-up appointment in: 2 MONTH  Your physician recommends that you return for lab work in: 2 MONTHS (BMP)

## 2012-10-30 ENCOUNTER — Encounter (HOSPITAL_BASED_OUTPATIENT_CLINIC_OR_DEPARTMENT_OTHER): Payer: Medicaid Other | Attending: Plastic Surgery

## 2012-10-30 DIAGNOSIS — L98499 Non-pressure chronic ulcer of skin of other sites with unspecified severity: Secondary | ICD-10-CM | POA: Insufficient documentation

## 2012-10-30 NOTE — Progress Notes (Signed)
[facility]  NAME:  CARMIN, DIBARTOLO NO.:  [id number]  MEDICAL RECORD NO.:  [id number]      DATE OF BIRTH:  Jun 04, 1976  PHYSICIAN:  Wayland Denis, DO       VISIT DATE:  10/30/2012                                  OFFICE VISIT   The patient is a 37 year old gentleman was recently released from the hospital and the stay facility.  He has a very large left ischial ulcer, but it is clean.  There is no fibrous tissue.  There are signs of granulation and epithelialization from the size.  His medications are unchanged except that he is not taking them because he has run out.  His social history is unchanged.  He is at home.  On exam, he is alert, oriented, cooperative, not in any acute distress. His pupils are equal.  His extraocular muscles are intact.  No cervical lymphadenopathy.  His breathing is unlabored.  His heart is regular.  The wound is described above.  Recommend continuing with VAC, switching to KCI, multivitamin, vitamin C, zinc, offloading, Roho cushion, air mattress bed, PCP referral, check a pre-albumin, and see him back in follow up.     Wayland Denis, DO     CS/MEDQ  D:  10/30/2012  T:  10/30/2012  Job:  [id number]

## 2012-11-06 ENCOUNTER — Encounter: Payer: Self-pay | Admitting: Internal Medicine

## 2012-11-06 ENCOUNTER — Ambulatory Visit (INDEPENDENT_AMBULATORY_CARE_PROVIDER_SITE_OTHER): Payer: Medicaid Other | Admitting: Internal Medicine

## 2012-11-06 ENCOUNTER — Encounter: Payer: Self-pay | Admitting: *Deleted

## 2012-11-06 ENCOUNTER — Encounter: Payer: Medicaid Other | Attending: Plastic Surgery | Admitting: *Deleted

## 2012-11-06 VITALS — BP 91/57 | HR 80 | Temp 98.3°F

## 2012-11-06 DIAGNOSIS — A419 Sepsis, unspecified organism: Secondary | ICD-10-CM

## 2012-11-06 DIAGNOSIS — Z713 Dietary counseling and surveillance: Secondary | ICD-10-CM | POA: Insufficient documentation

## 2012-11-06 DIAGNOSIS — L89109 Pressure ulcer of unspecified part of back, unspecified stage: Secondary | ICD-10-CM | POA: Insufficient documentation

## 2012-11-06 DIAGNOSIS — L8994 Pressure ulcer of unspecified site, stage 4: Secondary | ICD-10-CM | POA: Insufficient documentation

## 2012-11-06 DIAGNOSIS — A4101 Sepsis due to Methicillin susceptible Staphylococcus aureus: Secondary | ICD-10-CM

## 2012-11-06 DIAGNOSIS — M869 Osteomyelitis, unspecified: Secondary | ICD-10-CM

## 2012-11-06 DIAGNOSIS — L89154 Pressure ulcer of sacral region, stage 4: Secondary | ICD-10-CM

## 2012-11-06 DIAGNOSIS — R509 Fever, unspecified: Secondary | ICD-10-CM

## 2012-11-06 NOTE — Progress Notes (Addendum)
Medical Nutrition Therapy:  Appt start time: 1030 end time:  1115.  Assessment:  Patient with a stage 4 pressure ulcer. He is wheelchair-bound due to paraplegia. He reports that he needs recommendations to improve protein intake. He needs to improve prealbumin levels to 15 in order to have procedure to close wound. He regularly eats meat and reads food labels for protein content of foods. However, he is unclear about what types of foods have the most protein. He also expresses difficulty keeping sodium intake low. He reports he has a prescription for Ensure, but has not used yet.   Weight at last office visit was 198 pounds. Height 74 inches.   Albumin 2.9 on 10/23/12, which is up from 1.13 August 2012. No results for prealbumin at this time (to be drawn by [facility]).   MEDICATIONS: Mag-Ox, Zofran, protonix, miralax, vitamin C, vitamin E, zinc, Os-Cal, MVI   DIETARY INTAKE:   Usual eating pattern includes 3 meals and 1-2 snacks per day.  24-hr recall:  B ( AM): Deli turkey/chicken, 2 scrambled eggs, 1/2-1 cup rice or grits, croissant/biscuit/toast, diet V8 Splash Snk ( AM): Nonw  L ( PM): [place] sandwich on croissant Snk ( PM): Cheese doodles, cookies D ( PM): Taco salad, or steak, potatoes, salad, garlic bread Snk ( PM): same as above Beverages: water, diet V8 Splash  Usual physical activity: None, patient wheelchair bound  Estimated energy needs: 2300 calories 288 g carbohydrates 120-140 g protein 64 g fat  Progress Towards Goal(s):  In progress.   Nutritional Diagnosis:  NI-5.1 Increased nutrient needs (specify): protein As related to wound healing.  As evidenced by stage 4 sacrum pressure ulcer.    Intervention:  Nutrition counseling. We discussed foods high in protein, including meat, fish, eggs, nuts and seeds. We reviewed how to fit these foods into daily meal plan. We also briefly discussed sodium intake and ways to reduce dietary sodium.   Goals:  1. 120-140 g protein  daily (at least 30 g at meals, 15-20 grams at snacks) 2. Incorporate 2 snacks into daily schedule with protein source at eat snack (recommend protein bar with at least 10-15 g protein at one, Ensure shake at the other) 3. Read food labels for protein and sodium content.   Handouts given during visit include:  Pressure ulcer nutrition therapy  Yellow meal plan card  Monitoring/Evaluation:  Dietary intake, exercise, prealbumin, and body weight prn.

## 2012-11-06 NOTE — Progress Notes (Signed)
Patient ID: [name] [name], male   DOB: January 25, 1976, 37 y.o.   MRN: [id number]    [facility]  Patient Active Problem List  Diagnosis  . Sacral decubitus ulcer, stage IV, s/p debridement & colostomy diversion  . Paraplegia following T10spinal cord injury  . Anemia  . Chronic systolic congestive heart failure  . Acute renal failure  . History of frequent urinary tract infections  . Inguinal hernia, left  . Staphylococcus aureus bacteremia with sepsis  . Osteomyelitis of pelvic region  . ICD (implantable cardiac defibrillator) infection  . Colostomy status  . Fever and chills    Patient's Medications  New Prescriptions   No medications on file  Previous Medications   ASPIRIN 81 MG EC TABLET    Take 1 tablet (81 mg total) by mouth daily. Swallow whole.   CARVEDILOL (COREG) 3.125 MG TABLET    Take 1 tablet (3.125 mg total) by mouth 2 (two) times daily with a meal.   COLLAGENASE (SANTYL) OINTMENT    Apply 1 application topically daily as needed. For wound care.  Applied to necrotic tissue.   FENTANYL (DURAGESIC - DOSED MCG/HR) 50 MCG/HR    Place 1 patch (50 mcg total) onto the skin every 3 (three) days.   FERROUS GLUCONATE (FERGON) 324 MG TABLET    Take 1 tablet (324 mg total) by mouth 2 (two) times daily.   MAGNESIUM OXIDE (MAG-OX) 400 (241.3 MG) MG TABLET    Take 1 tablet (400 mg total) by mouth daily.   METHOCARBAMOL (ROBAXIN) 500 MG TABLET    Take 1 tablet (500 mg total) by mouth every 8 (eight) hours as needed.   MULTIPLE VITAMIN (MULTIVITAMIN WITH MINERALS) TABS    Take 1 tablet by mouth daily.   ONDANSETRON (ZOFRAN) 4 MG/5ML SOLUTION    Take 5 mLs (4 mg total) by mouth 3 (three) times daily.   OXYCODONE (ROXICODONE) 5 MG IMMEDIATE RELEASE TABLET    Take 1 tablet (5 mg total) by mouth every 8 (eight) hours as needed for pain.   PANTOPRAZOLE (PROTONIX) 40 MG TABLET    Take 1 tablet (40 mg total) by mouth 2 (two) times daily before a meal.   POLYETHYLENE  GLYCOL (MIRALAX / GLYCOLAX) PACKET    Take 34 g by mouth 2 (two) times daily.   SIMVASTATIN (ZOCOR) 40 MG TABLET    Take 1 tablet (40 mg total) by mouth every evening.   ZINC SULFATE 220 MG CAPSULE    Take 1 capsule (220 mg total) by mouth daily.  Modified Medications   No medications on file  Discontinued Medications   DEXTROSE 5 % SOLN 50 ML WITH CEFEPIME 1 G SOLR 1 G    Inject 1 g into the vein every 12 (twelve) hours.    Subjective: Where he is feeling much better. He has not had any further fever, chills or low back pain and abdominal pain. He feels like that was altered to a symptomatic urinary tract infection. Since he started the empiric cefepime he started to improve. He completed his last dose yesterday.  He has seen a nutritionist and is working on increasing his caloric and protein intake. He tells me that Dr. Kelly Splinter at the [facility] once his pre-albumin level to be 15 or greater before considering wound closure.  Objective: Temp: 98.3 F (36.8 C) (01/27 1336) Temp src: Oral (01/27 1336) BP: 91/57 mmHg (01/27 1336) Pulse Rate: 80  (01/27 1336)  General: He is  in good spirits and appears to be feeling better; he is sitting in his wheelchair. Skin: Right arm PICC site looks okay  Lungs: Clear Cor: Regular S1-S2 no murmurs  Assessment: His MSSA bacteremia and pacemaker infection last fall were cured following a six-week course of IV antibiotics and removal of his pacemaker. His also treated recently with empiric cefepime for possible symptomatic UTI and his symptoms resolved. At this point he has no evidence of any active infection. I will leave his PICC in for the foreseeable future since he may need further IV therapy his leading up to his planned sacral wound closure.  Plan: 1. Observe off of antibiotics 2. Followup here as needed 3. Nutritional support 4. Wound care   Cliffton Asters, MD [facility] Disease [facility] [phone]  pager   [phone] cell 11/06/2012, 1:49 PM

## 2012-11-15 ENCOUNTER — Telehealth: Payer: Self-pay | Admitting: *Deleted

## 2012-11-15 NOTE — Telephone Encounter (Signed)
Nurse advised that patient complains that his urine is looking cloudy again. Advised no other symptoms but wants to know if we want them to get a sample for culture. After reading doctors note from 11/06/12 advised her to call doctor Avbuere office as we are done with therapy until he has his wound closed and needs further IV antibiotics. We were not treating him for a UTI and his PCP should manage that if there is an infection.

## 2012-11-27 ENCOUNTER — Encounter (HOSPITAL_BASED_OUTPATIENT_CLINIC_OR_DEPARTMENT_OTHER): Payer: Medicaid Other | Attending: Plastic Surgery

## 2012-11-27 DIAGNOSIS — L98499 Non-pressure chronic ulcer of skin of other sites with unspecified severity: Secondary | ICD-10-CM | POA: Insufficient documentation
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
## 2012-12-18 ENCOUNTER — Encounter (HOSPITAL_BASED_OUTPATIENT_CLINIC_OR_DEPARTMENT_OTHER): Payer: Medicaid Other | Attending: Plastic Surgery

## 2012-12-18 ENCOUNTER — Encounter (HOSPITAL_COMMUNITY): Payer: Self-pay | Admitting: Emergency Medicine

## 2012-12-18 ENCOUNTER — Emergency Department (HOSPITAL_COMMUNITY)
Admission: EM | Admit: 2012-12-18 | Discharge: 2012-12-18 | Disposition: A | Discharge status: Home / Self Care | Payer: Medicaid Other | Attending: Emergency Medicine | Admitting: Emergency Medicine

## 2012-12-18 DIAGNOSIS — F17200 Nicotine dependence, unspecified, uncomplicated: Secondary | ICD-10-CM | POA: Insufficient documentation

## 2012-12-18 DIAGNOSIS — Z8719 Personal history of other diseases of the digestive system: Secondary | ICD-10-CM | POA: Insufficient documentation

## 2012-12-18 DIAGNOSIS — I422 Other hypertrophic cardiomyopathy: Secondary | ICD-10-CM | POA: Insufficient documentation

## 2012-12-18 DIAGNOSIS — N390 Urinary tract infection, site not specified: Secondary | ICD-10-CM | POA: Insufficient documentation

## 2012-12-18 DIAGNOSIS — G8220 Paraplegia, unspecified: Secondary | ICD-10-CM | POA: Insufficient documentation

## 2012-12-18 DIAGNOSIS — Z87448 Personal history of other diseases of urinary system: Secondary | ICD-10-CM | POA: Insufficient documentation

## 2012-12-18 DIAGNOSIS — L98499 Non-pressure chronic ulcer of skin of other sites with unspecified severity: Secondary | ICD-10-CM | POA: Insufficient documentation

## 2012-12-18 DIAGNOSIS — Z872 Personal history of diseases of the skin and subcutaneous tissue: Secondary | ICD-10-CM | POA: Insufficient documentation

## 2012-12-18 DIAGNOSIS — Z79899 Other long term (current) drug therapy: Secondary | ICD-10-CM | POA: Insufficient documentation

## 2012-12-18 DIAGNOSIS — H409 Unspecified glaucoma: Secondary | ICD-10-CM | POA: Insufficient documentation

## 2012-12-18 LAB — URINALYSIS, ROUTINE W REFLEX MICROSCOPIC
Hgb urine dipstick: NEGATIVE
Ketones, ur: NEGATIVE mg/dL
Nitrite: POSITIVE — AB
Protein, ur: 100 mg/dL — AB
Specific Gravity, Urine: 1.023 (ref 1.005–1.030)
Urobilinogen, UA: 2 mg/dL — ABNORMAL HIGH (ref 0.0–1.0)

## 2012-12-18 LAB — BASIC METABOLIC PANEL
BUN: 4 mg/dL — ABNORMAL LOW (ref 6–23)
Calcium: 8.8 mg/dL (ref 8.4–10.5)
GFR calc Af Amer: >90 mL/min (ref >90)
GFR calc non Af Amer: >90 mL/min (ref >90)
Glucose, Bld: 86 mg/dL (ref 70–99)
Sodium: 132 mEq/L — ABNORMAL LOW (ref 135–145)

## 2012-12-18 LAB — CBC WITH DIFFERENTIAL/PLATELET
Basophils Relative: 0 % (ref 0–1)
Eosinophils Absolute: 0.1 10*3/uL (ref 0.0–0.7)
Eosinophils Relative: 1 % (ref 0–5)
Lymphs Abs: 1.3 10*3/uL (ref 0.7–4.0)
MCH: 24.7 pg — ABNORMAL LOW (ref 26.0–34.0)
MCHC: 29.2 g/dL — ABNORMAL LOW (ref 30.0–36.0)
MCV: 84.6 fL (ref 78.0–100.0)
Neutrophils Relative %: 78 % — ABNORMAL HIGH (ref 43–77)
Platelets: 386 10*3/uL (ref 150–400)
RBC: 3.76 MIL/uL — ABNORMAL LOW (ref 4.22–5.81)

## 2012-12-18 LAB — URINE MICROSCOPIC-ADD ON

## 2012-12-18 MED ORDER — LEVOFLOXACIN 500 MG PO TABS: 500.0000 mg | ORAL_TABLET | Freq: Every day | ORAL | Status: DC

## 2012-12-18 MED ORDER — NITROFURANTOIN MONOHYD MACRO 100 MG PO CAPS: 100.0000 mg | ORAL_CAPSULE | Freq: Two times a day (BID) | ORAL | Status: DC

## 2012-12-18 MED ORDER — CEFTRIAXONE SODIUM 1 G IJ SOLR: 1.0000 g | Freq: Once | INTRAMUSCULAR | Status: DC
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
## 2012-12-18 NOTE — ED Notes (Signed)
Pt refused IM antibiotics and discharge VS at discharge. Pt had to pick up daughter at daycare.

## 2012-12-18 NOTE — ED Notes (Signed)
Pt presenting to ed with c/o uti symptoms with fever, chills, nausea and vomiting. Pt states his PCP also sent a prescription for him to have his pre-albumin level drawn here.

## 2012-12-19 NOTE — Progress Notes (Signed)
[facility]  NAME:  CURBY, CARSWELL NO.:  [id number]  MEDICAL RECORD NO.:  [id number]      DATE OF BIRTH:  Feb 08, 1976  PHYSICIAN:  Wayland Denis, DO       VISIT DATE:  12/18/2012                                  OFFICE VISIT   Mr. Placencia is a 37 year old male with paraplegia who presents to the office today for followup on his large sacral ulcer.  He has undergone debridement in the past with dressing care.  He is now at home and has limited mobility as well as limited help.  He tries to shift as much as he can but with a regular bed this could be quite difficult. His nutrition status previously showed a prealbumin 17 and we are rechecking that again today.  He has limited ability for daily activity due to his paraplegia and now he need to be off the wound on a regular basis.  It is requiring him to be in the bed for basically most of the day.  His current wound size is 16 x 8.5 x 2.4 cm.  A hospital bed mattress would assist in correcting his position problems and help with offloading.  We will also work on his nutrition.  Today, he is not feeling well and therefore he is going to the emergency room for evaluation of a possible urinary tract infection.  In the meantime, we will continue with wet-to-dry dressings.     Wayland Denis, DO     CS/MEDQ  D:  12/18/2012  T:  12/19/2012  Job:  [id number]

## 2012-12-20 LAB — URINE CULTURE: Colony Count: >=100000

## 2012-12-22 ENCOUNTER — Ambulatory Visit: Payer: Medicaid Other | Admitting: Cardiovascular Disease

## 2012-12-22 ENCOUNTER — Other Ambulatory Visit: Payer: Medicaid Other

## 2012-12-22 NOTE — ED Notes (Signed)
+   Urine Patient treated with Levaquin-sensitive to same-chart appended per protocol MD. 

## 2013-01-05 ENCOUNTER — Ambulatory Visit: Payer: Medicaid Other | Admitting: Cardiovascular Disease

## 2013-01-09 ENCOUNTER — Emergency Department (HOSPITAL_COMMUNITY): Payer: Medicaid Other

## 2013-01-09 ENCOUNTER — Inpatient Hospital Stay (HOSPITAL_COMMUNITY): Payer: Medicaid Other

## 2013-01-09 ENCOUNTER — Inpatient Hospital Stay (HOSPITAL_COMMUNITY)
Admission: EM | Admit: 2013-01-09 | Discharge: 2013-01-16 | DRG: 689 | Disposition: A | Discharge status: Home Health | Payer: Medicaid Other | Attending: Internal Medicine | Admitting: Internal Medicine

## 2013-01-09 ENCOUNTER — Encounter (HOSPITAL_COMMUNITY): Payer: Self-pay | Admitting: *Deleted

## 2013-01-09 DIAGNOSIS — R6510 Systemic inflammatory response syndrome (SIRS) of non-infectious origin without acute organ dysfunction: Secondary | ICD-10-CM

## 2013-01-09 DIAGNOSIS — E876 Hypokalemia: Secondary | ICD-10-CM | POA: Diagnosis present

## 2013-01-09 DIAGNOSIS — G8220 Paraplegia, unspecified: Secondary | ICD-10-CM

## 2013-01-09 DIAGNOSIS — I5022 Chronic systolic (congestive) heart failure: Secondary | ICD-10-CM

## 2013-01-09 DIAGNOSIS — L03317 Cellulitis of buttock: Secondary | ICD-10-CM

## 2013-01-09 DIAGNOSIS — L0231 Cutaneous abscess of buttock: Secondary | ICD-10-CM

## 2013-01-09 DIAGNOSIS — N390 Urinary tract infection, site not specified: Principal | ICD-10-CM

## 2013-01-09 DIAGNOSIS — K4090 Unilateral inguinal hernia, without obstruction or gangrene, not specified as recurrent: Secondary | ICD-10-CM

## 2013-01-09 DIAGNOSIS — A4101 Sepsis due to Methicillin susceptible Staphylococcus aureus: Secondary | ICD-10-CM

## 2013-01-09 DIAGNOSIS — Y33XXXS Other specified events, undetermined intent, sequela: Secondary | ICD-10-CM

## 2013-01-09 DIAGNOSIS — F17200 Nicotine dependence, unspecified, uncomplicated: Secondary | ICD-10-CM | POA: Diagnosis present

## 2013-01-09 DIAGNOSIS — N179 Acute kidney failure, unspecified: Secondary | ICD-10-CM

## 2013-01-09 DIAGNOSIS — L8990 Pressure ulcer of unspecified site, unspecified stage: Secondary | ICD-10-CM

## 2013-01-09 DIAGNOSIS — L89109 Pressure ulcer of unspecified part of back, unspecified stage: Secondary | ICD-10-CM | POA: Diagnosis present

## 2013-01-09 DIAGNOSIS — L8993 Pressure ulcer of unspecified site, stage 3: Secondary | ICD-10-CM | POA: Diagnosis present

## 2013-01-09 DIAGNOSIS — L97429 Non-pressure chronic ulcer of left heel and midfoot with unspecified severity: Secondary | ICD-10-CM | POA: Diagnosis present

## 2013-01-09 DIAGNOSIS — A491 Streptococcal infection, unspecified site: Secondary | ICD-10-CM

## 2013-01-09 DIAGNOSIS — D638 Anemia in other chronic diseases classified elsewhere: Secondary | ICD-10-CM | POA: Diagnosis present

## 2013-01-09 DIAGNOSIS — L89609 Pressure ulcer of unspecified heel, unspecified stage: Secondary | ICD-10-CM | POA: Diagnosis present

## 2013-01-09 DIAGNOSIS — S300XXA Contusion of lower back and pelvis, initial encounter: Secondary | ICD-10-CM

## 2013-01-09 DIAGNOSIS — N319 Neuromuscular dysfunction of bladder, unspecified: Secondary | ICD-10-CM | POA: Diagnosis present

## 2013-01-09 DIAGNOSIS — E871 Hypo-osmolality and hyponatremia: Secondary | ICD-10-CM | POA: Diagnosis present

## 2013-01-09 DIAGNOSIS — Z87440 Personal history of urinary (tract) infections: Secondary | ICD-10-CM

## 2013-01-09 DIAGNOSIS — Z933 Colostomy status: Secondary | ICD-10-CM

## 2013-01-09 DIAGNOSIS — IMO0002 Reserved for concepts with insufficient information to code with codable children: Secondary | ICD-10-CM

## 2013-01-09 DIAGNOSIS — Z1612 Extended spectrum beta lactamase (ESBL) resistance: Secondary | ICD-10-CM

## 2013-01-09 DIAGNOSIS — L8994 Pressure ulcer of unspecified site, stage 4: Secondary | ICD-10-CM

## 2013-01-09 DIAGNOSIS — Z95810 Presence of automatic (implantable) cardiac defibrillator: Secondary | ICD-10-CM

## 2013-01-09 DIAGNOSIS — B954 Other streptococcus as the cause of diseases classified elsewhere: Secondary | ICD-10-CM

## 2013-01-09 DIAGNOSIS — E43 Unspecified severe protein-calorie malnutrition: Secondary | ICD-10-CM | POA: Diagnosis present

## 2013-01-09 DIAGNOSIS — L89154 Pressure ulcer of sacral region, stage 4: Secondary | ICD-10-CM

## 2013-01-09 DIAGNOSIS — A499 Bacterial infection, unspecified: Secondary | ICD-10-CM

## 2013-01-09 DIAGNOSIS — Z79899 Other long term (current) drug therapy: Secondary | ICD-10-CM

## 2013-01-09 DIAGNOSIS — M869 Osteomyelitis, unspecified: Secondary | ICD-10-CM

## 2013-01-09 DIAGNOSIS — A419 Sepsis, unspecified organism: Secondary | ICD-10-CM

## 2013-01-09 DIAGNOSIS — R509 Fever, unspecified: Secondary | ICD-10-CM

## 2013-01-09 DIAGNOSIS — M8668 Other chronic osteomyelitis, other site: Secondary | ICD-10-CM | POA: Diagnosis present

## 2013-01-09 DIAGNOSIS — A4901 Methicillin susceptible Staphylococcus aureus infection, unspecified site: Secondary | ICD-10-CM

## 2013-01-09 DIAGNOSIS — L97409 Non-pressure chronic ulcer of unspecified heel and midfoot with unspecified severity: Secondary | ICD-10-CM

## 2013-01-09 DIAGNOSIS — D649 Anemia, unspecified: Secondary | ICD-10-CM

## 2013-01-09 DIAGNOSIS — Z905 Acquired absence of kidney: Secondary | ICD-10-CM

## 2013-01-09 DIAGNOSIS — G609 Hereditary and idiopathic neuropathy, unspecified: Secondary | ICD-10-CM | POA: Diagnosis present

## 2013-01-09 DIAGNOSIS — F1210 Cannabis abuse, uncomplicated: Secondary | ICD-10-CM | POA: Diagnosis present

## 2013-01-09 DIAGNOSIS — M4628 Osteomyelitis of vertebra, sacral and sacrococcygeal region: Secondary | ICD-10-CM

## 2013-01-09 LAB — COMPREHENSIVE METABOLIC PANEL
ALT: 6 U/L (ref 0–53)
AST: 9 U/L (ref 0–37)
Albumin: 1.5 g/dL — ABNORMAL LOW (ref 3.5–5.2)
Alkaline Phosphatase: 185 U/L — ABNORMAL HIGH (ref 39–117)
BUN: 5 mg/dL — ABNORMAL LOW (ref 6–23)
CO2: 26 mEq/L (ref 19–32)
Calcium: 8.8 mg/dL (ref 8.4–10.5)
Chloride: 92 mEq/L — ABNORMAL LOW (ref 96–112)
Creatinine, Ser: 0.42 mg/dL — ABNORMAL LOW (ref 0.50–1.35)
GFR calc Af Amer: >90 mL/min (ref >90)
GFR calc non Af Amer: >90 mL/min (ref >90)
Glucose, Bld: 76 mg/dL (ref 70–99)
Potassium: 4.4 mEq/L (ref 3.5–5.1)
Sodium: 128 mEq/L — ABNORMAL LOW (ref 135–145)
Total Bilirubin: 1.5 mg/dL — ABNORMAL HIGH (ref 0.3–1.2)
Total Protein: 7.4 g/dL (ref 6.0–8.3)

## 2013-01-09 LAB — CBC
MCH: 24.9 pg — ABNORMAL LOW (ref 26.0–34.0)
MCHC: 31.7 g/dL (ref 30.0–36.0)
MCV: 78.5 fL (ref 78.0–100.0)
Platelets: 286 10*3/uL (ref 150–400)
RDW: 16 % — ABNORMAL HIGH (ref 11.5–15.5)

## 2013-01-09 LAB — LACTIC ACID, PLASMA: Lactic Acid, Venous: 1.1 mmol/L (ref 0.5–2.2)

## 2013-01-09 LAB — TROPONIN I: Troponin I: <0.3 ng/mL (ref <0.30)

## 2013-01-09 MED ORDER — ONDANSETRON HCL 4 MG PO TABS
4.0000 mg | ORAL_TABLET | Freq: Four times a day (QID) | ORAL | Status: DC | PRN
  Administered 2013-01-09: 4 mg via ORAL
  Filled 2013-01-09: qty 1

## 2013-01-09 MED ORDER — SODIUM CHLORIDE 0.9 % IV SOLN
INTRAVENOUS | Status: DC
  Administered 2013-01-09 – 2013-01-11 (×5): via INTRAVENOUS
  Administered 2013-01-12: 125 mL/h via INTRAVENOUS
  Administered 2013-01-13: 07:00:00 via INTRAVENOUS

## 2013-01-09 MED ORDER — HYDROMORPHONE HCL PF 1 MG/ML IJ SOLN
1.0000 mg | INTRAMUSCULAR | Status: DC | PRN
  Administered 2013-01-09 – 2013-01-11 (×6): 1 mg via INTRAVENOUS
  Filled 2013-01-09 (×5): qty 1

## 2013-01-09 MED ORDER — ALUM & MAG HYDROXIDE-SIMETH 200-200-20 MG/5ML PO SUSP: 30.0000 mL | Freq: Four times a day (QID) | ORAL | Status: DC | PRN

## 2013-01-09 MED ORDER — ONDANSETRON HCL 4 MG/2ML IJ SOLN
4.0000 mg | Freq: Four times a day (QID) | INTRAMUSCULAR | Status: DC | PRN
  Administered 2013-01-10 – 2013-01-16 (×6): 4 mg via INTRAVENOUS
  Filled 2013-01-09 (×7): qty 2

## 2013-01-09 MED ORDER — SODIUM CHLORIDE 0.9 % IV SOLN
500.0000 mg | Freq: Once | INTRAVENOUS | Status: AC
  Administered 2013-01-09: 500 mg via INTRAVENOUS
  Filled 2013-01-09: qty 500

## 2013-01-09 MED ORDER — SODIUM CHLORIDE 0.9 % IJ SOLN
3.0000 mL | Freq: Two times a day (BID) | INTRAMUSCULAR | Status: DC
  Administered 2013-01-10 – 2013-01-13 (×3): 3 mL via INTRAVENOUS

## 2013-01-09 MED ORDER — ACETAMINOPHEN 650 MG RE SUPP: 650.0000 mg | Freq: Four times a day (QID) | RECTAL | Status: DC | PRN

## 2013-01-09 MED ORDER — PIPERACILLIN-TAZOBACTAM 3.375 G IVPB
3.3750 g | Freq: Once | INTRAVENOUS | Status: AC
  Administered 2013-01-09: 3.375 g via INTRAVENOUS
  Filled 2013-01-09: qty 50

## 2013-01-09 MED ORDER — VANCOMYCIN HCL IN DEXTROSE 1-5 GM/200ML-% IV SOLN
1000.0000 mg | Freq: Once | INTRAVENOUS | Status: AC
  Administered 2013-01-09: 1000 mg via INTRAVENOUS
  Filled 2013-01-09: qty 200

## 2013-01-09 MED ORDER — SODIUM CHLORIDE 0.9 % IV BOLUS (SEPSIS)
1000.0000 mL | Freq: Once | INTRAVENOUS | Status: AC
  Administered 2013-01-09: 1000 mL via INTRAVENOUS

## 2013-01-09 MED ORDER — ACETAMINOPHEN 325 MG PO TABS
650.0000 mg | ORAL_TABLET | Freq: Four times a day (QID) | ORAL | Status: DC | PRN
  Administered 2013-01-09 – 2013-01-16 (×5): 650 mg via ORAL
  Filled 2013-01-09 (×5): qty 2

## 2013-01-09 MED ORDER — OXYCODONE HCL 5 MG PO TABS
5.0000 mg | ORAL_TABLET | ORAL | Status: DC | PRN
  Administered 2013-01-09: 5 mg via ORAL
  Filled 2013-01-09: qty 1

## 2013-01-09 MED ORDER — VANCOMYCIN HCL IN DEXTROSE 1-5 GM/200ML-% IV SOLN
1000.0000 mg | Freq: Three times a day (TID) | INTRAVENOUS | Status: DC
  Administered 2013-01-10: 1000 mg via INTRAVENOUS
  Filled 2013-01-09 (×3): qty 200

## 2013-01-09 MED ORDER — HYDROMORPHONE HCL PF 1 MG/ML IJ SOLN
1.0000 mg | INTRAMUSCULAR | Status: DC | PRN
  Filled 2013-01-09: qty 1

## 2013-01-09 MED ORDER — SODIUM CHLORIDE 0.9 % IV SOLN
INTRAVENOUS | Status: DC
  Administered 2013-01-09: 21:00:00 via INTRAVENOUS

## 2013-01-09 MED ORDER — ENOXAPARIN SODIUM 40 MG/0.4ML ~~LOC~~ SOLN
40.0000 mg | SUBCUTANEOUS | Status: DC
  Administered 2013-01-09 – 2013-01-15 (×7): 40 mg via SUBCUTANEOUS
  Filled 2013-01-09 (×9): qty 0.4

## 2013-01-09 MED ORDER — ONDANSETRON HCL 4 MG/2ML IJ SOLN: 4.0000 mg | Freq: Three times a day (TID) | INTRAMUSCULAR | Status: DC | PRN

## 2013-01-09 MED ORDER — SODIUM CHLORIDE 0.9 % IV SOLN
500.0000 mg | Freq: Four times a day (QID) | INTRAVENOUS | Status: DC
  Administered 2013-01-10 – 2013-01-12 (×11): 500 mg via INTRAVENOUS
  Filled 2013-01-09 (×15): qty 500

## 2013-01-09 MED ORDER — SODIUM CHLORIDE 0.9 % IV SOLN
Freq: Once | INTRAVENOUS | Status: AC
  Administered 2013-01-09: 22:00:00 via INTRAVENOUS

## 2013-01-09 MED ORDER — HYDROCODONE-ACETAMINOPHEN 5-325 MG PO TABS
1.0000 | ORAL_TABLET | ORAL | Status: DC | PRN
  Filled 2013-01-09: qty 2

## 2013-01-09 MED ORDER — OXYBUTYNIN CHLORIDE 5 MG PO TABS
5.0000 mg | ORAL_TABLET | Freq: Three times a day (TID) | ORAL | Status: DC
  Administered 2013-01-09 – 2013-01-16 (×20): 5 mg via ORAL
  Filled 2013-01-09 (×23): qty 1
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
## 2013-01-09 NOTE — ED Notes (Addendum)
To ED for eval of fever, lower back pain, foul smelling urine with noted blood. Pt has hx of urosepsis. Appears to not feel well. Pt in/out caths self approx 6 times per day.
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
## 2013-01-09 NOTE — ED Notes (Signed)
MD at bedside. 

## 2013-01-09 NOTE — ED Notes (Signed)
Report given to dorothy, rn.  Pt transported to stepdown via stretcher.
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
## 2013-01-10 ENCOUNTER — Inpatient Hospital Stay (HOSPITAL_COMMUNITY): Payer: Medicaid Other

## 2013-01-10 DIAGNOSIS — S300XXA Contusion of lower back and pelvis, initial encounter: Secondary | ICD-10-CM

## 2013-01-10 DIAGNOSIS — M4628 Osteomyelitis of vertebra, sacral and sacrococcygeal region: Secondary | ICD-10-CM | POA: Diagnosis present

## 2013-01-10 DIAGNOSIS — Z1619 Resistance to other specified beta lactam antibiotics: Secondary | ICD-10-CM

## 2013-01-10 DIAGNOSIS — A499 Bacterial infection, unspecified: Secondary | ICD-10-CM | POA: Diagnosis present

## 2013-01-10 LAB — URINALYSIS, ROUTINE W REFLEX MICROSCOPIC
Bilirubin Urine: NEGATIVE
Hgb urine dipstick: NEGATIVE
Ketones, ur: 15 mg/dL — AB
Nitrite: POSITIVE — AB
Protein, ur: 30 mg/dL — AB
Urobilinogen, UA: 1 mg/dL (ref 0.0–1.0)

## 2013-01-10 LAB — CBC
Hemoglobin: 6.6 g/dL — CL (ref 13.0–17.0)
MCH: 24.4 pg — ABNORMAL LOW (ref 26.0–34.0)
MCHC: 30.7 g/dL (ref 30.0–36.0)
MCHC: 32.9 g/dL (ref 30.0–36.0)
MCV: 79.3 fL (ref 78.0–100.0)
Platelets: 304 10*3/uL (ref 150–400)
RDW: 15.9 % — ABNORMAL HIGH (ref 11.5–15.5)

## 2013-01-10 LAB — BASIC METABOLIC PANEL
BUN: 4 mg/dL — ABNORMAL LOW (ref 6–23)
Calcium: 8.3 mg/dL — ABNORMAL LOW (ref 8.4–10.5)
GFR calc non Af Amer: >90 mL/min (ref >90)
Glucose, Bld: 127 mg/dL — ABNORMAL HIGH (ref 70–99)

## 2013-01-10 LAB — URINE MICROSCOPIC-ADD ON

## 2013-01-10 LAB — PREPARE RBC (CROSSMATCH)

## 2013-01-10 LAB — GRAM STAIN

## 2013-01-10 MED ORDER — OXYCODONE HCL 5 MG PO TABS
5.0000 mg | ORAL_TABLET | ORAL | Status: DC | PRN
  Administered 2013-01-10 – 2013-01-11 (×4): 10 mg via ORAL
  Filled 2013-01-10 (×4): qty 2

## 2013-01-10 MED ORDER — VANCOMYCIN HCL IN DEXTROSE 1-5 GM/200ML-% IV SOLN
1000.0000 mg | Freq: Two times a day (BID) | INTRAVENOUS | Status: DC
  Administered 2013-01-10 – 2013-01-16 (×13): 1000 mg via INTRAVENOUS
  Filled 2013-01-10 (×15): qty 200

## 2013-01-10 MED ORDER — SODIUM CHLORIDE 0.9 % IJ SOLN
10.0000 mL | INTRAMUSCULAR | Status: DC | PRN
  Administered 2013-01-14 – 2013-01-15 (×4): 10 mL
  Administered 2013-01-16: 20 mL

## 2013-01-10 MED ORDER — ENSURE COMPLETE PO LIQD
237.0000 mL | Freq: Three times a day (TID) | ORAL | Status: DC
  Administered 2013-01-11 – 2013-01-16 (×9): 237 mL via ORAL

## 2013-01-10 MED ORDER — JUVEN PO PACK
1.0000 | PACK | Freq: Two times a day (BID) | ORAL | Status: DC
  Administered 2013-01-12 (×2): 1 via ORAL
  Filled 2013-01-10 (×5): qty 1

## 2013-01-10 MED ORDER — SODIUM CHLORIDE 0.9 % IJ SOLN
10.0000 mL | Freq: Two times a day (BID) | INTRAMUSCULAR | Status: DC
  Administered 2013-01-10 – 2013-01-13 (×7): 10 mL
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
## 2013-01-10 NOTE — Progress Notes (Signed)
piccPeripherally Inserted Central Catheter/Midline Placement  The IV Nurse has discussed with the patient and/or persons authorized to consent for the patient, the purpose of this procedure and the potential benefits and risks involved with this procedure.  The benefits include less needle sticks, lab draws from the catheter and patient may be discharged home with the catheter.  Risks include, but not limited to, infection, bleeding, blood clot (thrombus formation), and puncture of an artery; nerve damage and irregular heat beat.  Alternatives to this procedure were also discussed.  PICC/Midline Placement Documentation  PICC / Midline Double Lumen 07/30/12 PICC Right Basilic (Active)       [name] [name] 01/10/2013, 9:53 AM
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
## 2013-01-10 NOTE — Consult Note (Signed)
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 now and clinical course.  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 If he were to have need for cure of this area he will need a multidisciplinary approach with optimization of nutrition and assistance from a plastic surgeon  ultimately.  #3 extended spectrum beta-lactamase bruising organism in urine isolated to her recent stay in the emergency room and: Again as he is well covered imipenem continue contact precautions.   Thank you so much for this interesting consult  [facility] [phone] (pager) [phone] (office) 01/10/2013, 5:39 PM  Paulette Blanch Dam 01/10/2013, 5:39 PM

## 2013-01-10 NOTE — Progress Notes (Signed)
Utilization Review Completed. 01/10/2013

## 2013-01-10 NOTE — Consult Note (Addendum)
WOC consult Note Reason for Consult: Consult requested for chronic sacral and left heel wounds.  Pt was assessed by [facility] team in ER yesterday.  They felt that his sacrum wound was fairly clean, according to progress notes.  A new area of abscess was noted and drained to right buttock according to progress notes.  Pt familiar to [facility] nurse from previous assessment.  He is followed by the outpatient wound care center and Dr Kelly Splinter has told him he can have closure of the sacral wound when his prealbumin is adequate.  Wound type:  Sacrum stage 4 wound:exposed bone; 14X12X3 cm with undermining from 12 0'clock to 3 0'clock to 5 cm.Wound 90% red, 10% yellow.  Large amt thick tan-green drainage with odor. Pt states the amount and color of the drainage are unchanged for the past several weeks.   Left heel stage 3 wound: 3X2X.2cm, 20% yellow slough, 80% red, mod yellow drainage, no odor.  Pt has been using collagen dressing as ordered by the outpatient wound care center; this topical treatment is not available in the [facility] formulary. Substitute Aquacel to absorb drainage and provide antimicrobial benefits.   Left anterior foot with unstageable wound 2X2cm dry intact eschar without odor or drainage.  It is best practice to leave dry stable eschar intact.  Foam dressing applied to protect from further injury. Right heel stage 2: .5X.5X.1cm pink and dry without odor or drainage. Foam dressing applied. Plan: Will continue present plan of care which was ordered by the outpatient wound care center with moist gauze packing to sacrum.  He is currently being placed on an air overlay mattress for pressure reduction, but states he does not have an air mattress at home. Prevalon boots on to BLE to reduce pressure. He has home health assistance for dressing changes prior to admission. Educational handout given regarding pressure ulcer information. Discussed pressure ulcer etiology, topical treatment, and preventive measures  with patient. Pt and his mother, who is at the bedside, appear to have a good understanding regarding pressure ulcer and plan of care.    WOC ostomy consult  Stoma type/location: Colostomy to left lower quad.   Stomal assessment/size: Stoma pink and viable, above skin level Peristomal assessment: Intact skin surrounding stoma Output Large amt formed brown stool in pouch. Ostomy pouching: 2pc.  Education provided:  Pt states he is able to apply and empty pouch at home. He denies any problems with pouching routines or questions at this time.  Applied 2 piece pouch and ordered supplies to bedside for staff and patient use.  More than one hour spent on this consult. Cammie Mcgee MSN, RN, CWOCN, [place], CNS [phone]

## 2013-01-10 NOTE — Progress Notes (Signed)
Pt open to setting goals in AM. Seen by wound nurse and consulting teams. Pain controlled with alternating Dilaudid and oxycodone. Pt states his oxy dose is 20 mg, every 6 hours at home.the patient able to sleep between interventions. Will continue to set limits and provide support.
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
## 2013-01-10 NOTE — Progress Notes (Signed)
Lab called stating that pt's blood culture result came back positive, [name] Poag, PA on call had been notified through [email or web address]----Tenlee Wollin, rn
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
## 2013-01-11 DIAGNOSIS — A419 Sepsis, unspecified organism: Secondary | ICD-10-CM

## 2013-01-11 DIAGNOSIS — M869 Osteomyelitis, unspecified: Secondary | ICD-10-CM

## 2013-01-11 DIAGNOSIS — L8994 Pressure ulcer of unspecified site, stage 4: Secondary | ICD-10-CM

## 2013-01-11 DIAGNOSIS — L89109 Pressure ulcer of unspecified part of back, unspecified stage: Secondary | ICD-10-CM

## 2013-01-11 LAB — TYPE AND SCREEN
ABO/RH(D): O POS
Unit division: 0

## 2013-01-11 LAB — BASIC METABOLIC PANEL
BUN: 4 mg/dL — ABNORMAL LOW (ref 6–23)
Creatinine, Ser: 0.42 mg/dL — ABNORMAL LOW (ref 0.50–1.35)
GFR calc Af Amer: >90 mL/min (ref >90)
GFR calc non Af Amer: >90 mL/min (ref >90)

## 2013-01-11 LAB — CBC
HCT: 24.1 % — ABNORMAL LOW (ref 39.0–52.0)
MCHC: 32.4 g/dL (ref 30.0–36.0)
MCV: 79.8 fL (ref 78.0–100.0)
RDW: 15.9 % — ABNORMAL HIGH (ref 11.5–15.5)

## 2013-01-11 LAB — URINE CULTURE: Colony Count: NO GROWTH

## 2013-01-11 MED ORDER — FAMOTIDINE 40 MG/5ML PO SUSR
40.0000 mg | Freq: Two times a day (BID) | ORAL | Status: DC
  Administered 2013-01-11 – 2013-01-16 (×10): 40 mg via ORAL
  Filled 2013-01-11 (×11): qty 5

## 2013-01-11 MED ORDER — DAKINS (1/4 STRENGTH) 0.125 % EX SOLN
Freq: Two times a day (BID) | CUTANEOUS | Status: AC
  Administered 2013-01-11: 1
  Administered 2013-01-11 – 2013-01-12 (×3)
  Filled 2013-01-11: qty 473

## 2013-01-11 MED ORDER — NON FORMULARY: Freq: Two times a day (BID) | Status: DC

## 2013-01-11 MED ORDER — MIRTAZAPINE 30 MG PO TABS
30.0000 mg | ORAL_TABLET | Freq: Every day | ORAL | Status: DC
  Administered 2013-01-11 – 2013-01-15 (×5): 30 mg via ORAL
  Filled 2013-01-11 (×6): qty 1

## 2013-01-11 MED ORDER — POTASSIUM CHLORIDE CRYS ER 20 MEQ PO TBCR
EXTENDED_RELEASE_TABLET | ORAL | Status: AC
  Filled 2013-01-11: qty 2

## 2013-01-11 MED ORDER — OXYCODONE HCL 5 MG PO TABS
10.0000 mg | ORAL_TABLET | ORAL | Status: DC | PRN
  Administered 2013-01-11 (×2): 20 mg via ORAL
  Administered 2013-01-11: 10 mg via ORAL
  Administered 2013-01-12: 20 mg via ORAL
  Administered 2013-01-12: 10 mg via ORAL
  Administered 2013-01-12 (×3): 20 mg via ORAL
  Administered 2013-01-13: 10 mg via ORAL
  Administered 2013-01-13 (×2): 20 mg via ORAL
  Administered 2013-01-13 (×2): 10 mg via ORAL
  Administered 2013-01-13: 5 mg via ORAL
  Administered 2013-01-13: 15 mg via ORAL
  Administered 2013-01-13: 10 mg via ORAL
  Administered 2013-01-14 – 2013-01-16 (×16): 20 mg via ORAL
  Filled 2013-01-11 (×8): qty 4
  Filled 2013-01-11: qty 2
  Filled 2013-01-11: qty 4
  Filled 2013-01-11: qty 1
  Filled 2013-01-11 (×4): qty 4
  Filled 2013-01-11: qty 2
  Filled 2013-01-11 (×2): qty 4
  Filled 2013-01-11: qty 3
  Filled 2013-01-11: qty 2
  Filled 2013-01-11 (×6): qty 4
  Filled 2013-01-11 (×2): qty 2
  Filled 2013-01-11 (×3): qty 4
  Filled 2013-01-11: qty 2

## 2013-01-11 MED ORDER — POTASSIUM CHLORIDE CRYS ER 20 MEQ PO TBCR
40.0000 meq | EXTENDED_RELEASE_TABLET | Freq: Once | ORAL | Status: AC
  Administered 2013-01-11: 40 meq via ORAL

## 2013-01-11 NOTE — Progress Notes (Signed)
[facility]    Subjective: C/o nausea   Antibiotics:  Anti-infectives   Start     Dose/Rate Route Frequency Ordered Stop   01/10/13 1200  vancomycin (VANCOCIN) IVPB 1000 mg/200 mL premix     1,000 mg 200 mL/hr over 60 Minutes Intravenous Every 12 hours 01/10/13 0915     01/10/13 0300  imipenem-cilastatin (PRIMAXIN) 500 mg in sodium chloride 0.9 % 100 mL IVPB     500 mg 200 mL/hr over 30 Minutes Intravenous Every 6 hours 01/09/13 2013     01/10/13 0200  vancomycin (VANCOCIN) IVPB 1000 mg/200 mL premix  Status:  Discontinued     1,000 mg 200 mL/hr over 60 Minutes Intravenous Every 8 hours 01/09/13 2013 01/10/13 0915   01/09/13 2000  imipenem-cilastatin (PRIMAXIN) 500 mg in sodium chloride 0.9 % 100 mL IVPB     500 mg 200 mL/hr over 30 Minutes Intravenous  Once 01/09/13 1911 01/09/13 2107   01/09/13 1645  vancomycin (VANCOCIN) IVPB 1000 mg/200 mL premix     1,000 mg 200 mL/hr over 60 Minutes Intravenous  Once 01/09/13 1644 01/09/13 1919   01/09/13 1530  piperacillin-tazobactam (ZOSYN) IVPB 3.375 g     3.375 g 12.5 mL/hr over 240 Minutes Intravenous  Once 01/09/13 1515 01/09/13 1808      Medications: Scheduled Meds: . enoxaparin (LOVENOX) injection  40 mg Subcutaneous Q24H  . feeding supplement  237 mL Oral TID WC  . imipenem-cilastatin  500 mg Intravenous Q6H  . mirtazapine  30 mg Oral QHS  . nutrition supplement  1 packet Oral BID BM  . oxybutynin  5 mg Oral TID  . sodium chloride  10-40 mL Intracatheter Q12H  . sodium chloride  3 mL Intravenous Q12H  . sodium hypochlorite   Irrigation BID  . vancomycin  1,000 mg Intravenous Q12H   Continuous Infusions: . sodium chloride 125 mL/hr at 01/11/13 0446   PRN Meds:.acetaminophen, acetaminophen, alum & mag hydroxide-simeth, ondansetron (ZOFRAN) IV, ondansetron, oxyCODONE, sodium chloride   Objective: Weight change: 0 lb (0 kg)  Intake/Output Summary (Last 24 hours) at 01/11/13 1708 Last  data filed at 01/11/13 1600  Gross per 24 hour  Intake   3010 ml  Output   2750 ml  Net    260 ml   Blood pressure 93/51, pulse 104, temperature 98.9 F (37.2 C), temperature source Axillary, resp. rate 16, height 6\' 2"  (1.88 m), weight 154 lb 5.2 oz (70 kg), SpO2 100.00%. Temp:  [97.9 F (36.6 C)-102.4 F (39.1 C)] 98.9 F (37.2 C) (04/03 1527) Pulse Rate:  [91-128] 104 (04/03 1527) Resp:  [11-25] 16 (04/03 1527) BP: (79-97)/(39-55) 93/51 mmHg (04/03 1527) SpO2:  [96 %-100 %] 100 % (04/03 1527) Weight:  [154 lb 5.2 oz (70 kg)] 154 lb 5.2 oz (70 kg) (04/03 0513)  Physical Exam: General: Alert and awake, oriented x3, not in any acute distress.  HEENT: anicteric sclera, pupils reactive to light and accommodation, EOMI, oropharynx clear and without exudate  CVS regular rate, normal r, no murmur rubs or gallops  Chest: clear to auscultation bilaterally, no wheezing, rales or rhonchi  Abdomen: soft nontender, nondistended, normal bowel sounds,  Neuro: paraplegic  MSK: skin:bandaged, I did not take down the dressing today.   Lab Results:  Recent Labs  01/10/13 2125 01/11/13 0500  WBC 8.1 10.9*  HGB 7.4* 7.8*  HCT 22.5* 24.1*  PLT 241 253    BMET  Recent Labs  01/10/13 0537  01/11/13 0500  NA 133* 136  K 3.5 3.1*  CL 98 106  CO2 23 24  GLUCOSE 127* 103*  BUN 4* 4*  CREATININE 0.38* 0.42*  CALCIUM 8.3* 7.4*    Micro Results: Recent Results (from the past 240 hour(s))  CULTURE, BLOOD (ROUTINE X 2)     Status: None   Collection Time    01/09/13  4:45 PM      Result Value Range Status   Specimen Description BLOOD RIGHT FOREARM   Final   Special Requests BOTTLES DRAWN AEROBIC AND ANAEROBIC 10CC   Final   Culture  Setup Time 01/09/2013 23:39   Final   Culture     Final   Value: GRAM POSITIVE COCCI IN CHAINS     Note: Gram Stain Report Called to,Read Back By and Verified With: PEACE DORKU ON 01/10/2013 AT 10:26P BY WILEJ   Report Status PENDING   Incomplete    CULTURE, BLOOD (ROUTINE X 2)     Status: None   Collection Time    01/09/13  5:30 PM      Result Value Range Status   Specimen Description BLOOD ARM LEFT   Final   Special Requests BOTTLES DRAWN AEROBIC AND ANAEROBIC 10CC   Final   Culture  Setup Time 01/09/2013 23:39   Final   Culture     Final   Value:        BLOOD CULTURE RECEIVED NO GROWTH TO DATE CULTURE WILL BE HELD FOR 5 DAYS BEFORE ISSUING A FINAL NEGATIVE REPORT   Report Status PENDING   Incomplete  MRSA PCR SCREENING     Status: None   Collection Time    01/09/13  9:41 PM      Result Value Range Status   MRSA by PCR NEGATIVE  NEGATIVE Final   Comment:            The GeneXpert MRSA Assay (FDA     approved for NASAL specimens     only), is one component of a     comprehensive MRSA colonization     surveillance program. It is not     intended to diagnose MRSA     infection nor to guide or     monitor treatment for     MRSA infections.  GRAM STAIN     Status: None   Collection Time    01/10/13 12:46 AM      Result Value Range Status   Specimen Description ABSCESS RIGHT BUTTOCKS   Final   Special Requests Immunocompromised   Final   Gram Stain     Final   Value: FEW WBC PRESENT, PREDOMINANTLY PMN     RARE GRAM POSITIVE COCCI     Results Called toGinette Pitman [id number] [place]T   Report Status 01/10/2013 FINAL   Final  URINE CULTURE     Status: None   Collection Time    01/10/13 12:46 AM      Result Value Range Status   Specimen Description URINE, CATHETERIZED   Final   Special Requests NONE   Final   Culture  Setup Time 01/10/2013 08:50   Final   Colony Count NO GROWTH   Final   Culture NO GROWTH   Final   Report Status 01/11/2013 FINAL   Final  CULTURE, ROUTINE-ABSCESS     Status: None   Collection Time    01/10/13 12:46 AM      Result Value Range Status   Specimen  Description ABSCESS RIGHT BUTTOCKS   Final   Special Requests NONE   Final   Gram Stain     Final   Value: FEW WBC PRESENT, PREDOMINANTLY  PMN     NO SQUAMOUS EPITHELIAL CELLS SEEN     RARE GRAM POSITIVE COCCI     IN PAIRS Gram Stain Report Called to,Read Back By and Verified With: Gram Stain Report Called to,Read Back By and Verified With: K REID RN 01/10/13 0300 BY [place]T Performed at [facility]   Culture FEW STREPTOCOCCUS GROUP C   Final   Report Status PENDING   Incomplete  ANAEROBIC CULTURE     Status: None   Collection Time    01/10/13  3:17 PM      Result Value Range Status   Specimen Description ABSCESS   Final   Special Requests SACRAL   Final   Gram Stain     Final   Value: RARE WBC PRESENT, PREDOMINANTLY PMN     NO SQUAMOUS EPITHELIAL CELLS SEEN     RARE GRAM POSITIVE COCCI     IN PAIRS   Culture     Final   Value: NO ANAEROBES ISOLATED; CULTURE IN PROGRESS FOR 5 DAYS   Report Status PENDING   Incomplete    Studies/Results: Dg Chest Port 1 View  01/10/2013  *RADIOLOGY REPORT*  Clinical Data: PICC line placement  PORTABLE CHEST - 1 VIEW  Comparison: 01/09/2013  Findings: Cardiomediastinal silhouette is stable.  No acute infiltrate or pulmonary edema.  There is a right arm PICC line with tip in SVC right atrium junction.  No diagnostic pneumothorax.  IMPRESSION: Right arm PICC line with tip in SVC right atrium junction.  No diagnostic pneumothorax.   Original Report Authenticated By: Natasha Mead, M.D.    Dg Foot Complete Left  01/10/2013  *RADIOLOGY REPORT*  Clinical Data: Left heel ulcer.  Fever.  Paraplegia.  Rule out osteomyelitis.  LEFT FOOT - COMPLETE 3+ VIEW  Comparison: None.  Findings: Diffuse bone demineralization.  Degenerative changes in the first metatarsophalangeal joint.  Focal soft tissue lucencies suggesting gas over the posterior aspect of the left calcaneus probably representing the area of ulceration.  There is no underlying bone sclerosis, cortical irregularity, periosteal reaction, or bone erosion to suggest osteomyelitis.  No acute fracture or subluxation demonstrated.  IMPRESSION:  Degenerative changes and diffuse demineralization of the left foot. No radiographic changes of osteomyelitis.   Original Report Authenticated By: Burman Nieves, M.D.       Assessment/Plan: [name] [name] is a 37 y.o. male with   is paraplegic with a neurogenic bladder history of methicillin sensitive staph aureus bacteremia and pacemaker infection status post extraction and prolonged IV antibiotic therapy, history of extended spectrum beta-lactamase colonization of urine history of a large sacral decubitus ulcer with chronic osteomyelitis admitted acutely with Sirs/sepsis picture    #1 Sepsis: Urine culture actually sterile now. Blood with 1/2 GPC chains (likely a contaminant) Abscess with GPCC growing. 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    --Agree with Dr. Michaell Cowing that it would be reasonable to consult Orthopedics re possible interventions re his area of chronic osteo. 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 Parent he has seen Dr. Kelly Splinter in the past but because of his nutritional status was not a candidate for plastic surgery at that time.      LOS: 2 days   Acey Lav 01/11/2013, 5:08 PM

## 2013-01-11 NOTE — Progress Notes (Addendum)
Wound examined.  Dimensions per were ostomy nurse.  This is a chronic wound.  Granulation okay.  I would not trust a wound swab.  That only documents colonization.  There is no evidence of cellulitis nor soft tissue necrosis nor abscess.  There is no need for surgical debridement.  I do not think this is the source of his sepsis.  No evidence of cellulitis.  No evidence of significant necrosis.   I think the drainage is due to the significant tunneling/undermining going over the left hip towards the left inner thigh as well to the left upper back.  Needs to be adequately packed.  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  May require further bone debridement.  Defer to orthopedics on that.   The packing & wounds has a green/tan sheen consistent with Pseudomonas colonization.  I think the patient would benefit from decontamination.  I would use Dakin's 1/4 quarter strength solution BID x 48 hours.  The short course should help decrease bacterial colonization.  Then, switch back to normal saline or [place] for a bacteriostatic dressing.  Probably would do twice a day dressing changes to help it clean out faster while an inpatient.  Can consider hydrotherapy if this does not work.  Patient needs better nutrition.  He has poor tolerance of the powders at home.  Would recommend Ensure 4 times a day.  Needs to get a prealbumin greater than 16 to be surgical candidate for muscle flaps according to his reconstructive surgeon, Dr. Meliton Rattan.  See if social work/care management can help him get better outpatient nutritional support.  The patient needs better outpatient management.  He would need an air mattress at home.  That has been difficult to get through [facility].  Our group, [facility],,has had recurrent problems with this HH agency.  Consider switching to a different one.  Ardeth Sportsman, M.D., F.A.C.S. [facility] [address] [place], [place] [address] [phone] Main / Paging

## 2013-01-11 NOTE — Progress Notes (Signed)
SUP   Famotidine ordered twice daily.

## 2013-01-11 NOTE — Progress Notes (Addendum)
NUTRITION FOLLOW UP  DOCUMENTATION CODES  Per approved criteria   -Severe malnutrition in the context of chronic illness   Intervention:   1.  Supplements; d/c Juven.  Order [employer] with meals.  Continue other snacks. 2.  General healthful diet; discussed protein intake and encouraged high protein foods  Nutrition Dx:   Unintended wt change, ongoing  Monitor:   1. Food/Beverage; PO intake sufficient to meet >/=90% estimated needs.  Assessment:   Pt admitted with fever, found to have SIRS possibly related UTI. Pt with Stage 4 wound and diverting colostomy. Pt followed by [facility], however no plans to surgical intervention at this time.   Note [facility] recommendation for Ensure 4 times daily. Pt has started Remeron.  RD met with pt for discussion r/t to kcal and protein intake.  Pt reports he does not like Juven and given recommendation for 4 Ensure's per day pt is at risk for exceeding recommended allowance of Revigor.  RD to d/c Juven.  Pt reports decreased tolerance of food, particularly solids.  Pt reports frequent emesis after intake of meals.  Upon physical assessment pt does have signs of subcutaneous fat mass loss, but no wasting.   Discussed building intake around high protein foods and discussed sources of protein in diet.  Reviewed favorite and well-tolerated foods.  Encouraged small, high calorie, high protein meals with frequent supplementation.  Pt reports increased thirst  Pt qualifies for severe malnutrition of chronic illness based on 21% wt loss in 6 months, and intake insufficient to meet >/=75% estimated increased needs for >1 month.   Nutrition Focused Physical Exam:  Subcutaneous Fat:  Orbital Region: wnl Upper Arm Region: moderate Thoracic and Lumbar Region: moderate  Muscle:  Temple Region: wnl Clavicle Bone Region: wnl Clavicle and Acromion Bone Region: wnl Scapular Bone Region: wnl Dorsal Hand: wnl Patellar Region: not assessed Anterior  Thigh Region: not assessed Posterior Calf Region: not assessed  Edema: none present   Height: Ht Readings from Last 1 Encounters:  01/09/13 6\' 2"  (1.88 m)    Weight Status:   Wt Readings from Last 1 Encounters:  01/11/13 154 lb 5.2 oz (70 kg)    Re-estimated needs:  Kcal: 2100-2350 Protein: 105-126g Fluid: >2.0 L/day  Skin: Stage 4 to sacrum  Diet Order: General   Intake/Output Summary (Last 24 hours) at 01/11/13 1416 Last data filed at 01/11/13 1400  Gross per 24 hour  Intake   3000 ml  Output   2150 ml  Net    850 ml    Last BM: 4/3   Labs:   Recent Labs Lab 01/09/13 1419 01/10/13 0537 01/11/13 0500  NA 128* 133* 136  K 4.4 3.5 3.1*  CL 92* 98 106  CO2 26 23 24   BUN 5* 4* 4*  CREATININE 0.42* 0.38* 0.42*  CALCIUM 8.8 8.3* 7.4*  GLUCOSE 76 127* 103*    CBG (last 3)  No results found for this basename: GLUCAP,  in the last 72 hours  Scheduled Meds: . enoxaparin (LOVENOX) injection  40 mg Subcutaneous Q24H  . feeding supplement  237 mL Oral TID WC  . imipenem-cilastatin  500 mg Intravenous Q6H  . mirtazapine  30 mg Oral QHS  . nutrition supplement  1 packet Oral BID BM  . oxybutynin  5 mg Oral TID  . sodium chloride  10-40 mL Intracatheter Q12H  . sodium chloride  3 mL Intravenous Q12H  . sodium hypochlorite   Irrigation BID  . vancomycin  1,000 mg Intravenous Q12H    Continuous Infusions: . sodium chloride 125 mL/hr at 01/11/13 0446    Loyce Dys, MS RD LDN Clinical Inpatient Dietitian Pager: [phone] Weekend/After hours pager: [phone]

## 2013-01-11 NOTE — Consult Note (Signed)
Wound care follow-up:  [facility] team following for assessment and plan of care to sacral wounds.  Topical treatment has been ordered by this team. Please re-consult if further assistance is needed.  Thank-you,  Cammie Mcgee MSN, RN, CWOCN, [place], CNS [phone]
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
## 2013-01-12 DIAGNOSIS — M869 Osteomyelitis, unspecified: Secondary | ICD-10-CM

## 2013-01-12 DIAGNOSIS — B9689 Other specified bacterial agents as the cause of diseases classified elsewhere: Secondary | ICD-10-CM

## 2013-01-12 DIAGNOSIS — A4901 Methicillin susceptible Staphylococcus aureus infection, unspecified site: Secondary | ICD-10-CM

## 2013-01-12 DIAGNOSIS — A491 Streptococcal infection, unspecified site: Secondary | ICD-10-CM

## 2013-01-12 LAB — CULTURE, BLOOD (ROUTINE X 2)

## 2013-01-12 MED ORDER — SODIUM CHLORIDE 0.9 % IV SOLN
1.0000 g | Freq: Three times a day (TID) | INTRAVENOUS | Status: DC
  Administered 2013-01-12 – 2013-01-16 (×12): 1 g via INTRAVENOUS
  Filled 2013-01-12 (×16): qty 1
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
## 2013-01-12 NOTE — Progress Notes (Signed)
[facility]    Subjective: Feels better   Antibiotics:  Anti-infectives   Start     Dose/Rate Route Frequency Ordered Stop   01/10/13 1200  vancomycin (VANCOCIN) IVPB 1000 mg/200 mL premix     1,000 mg 200 mL/hr over 60 Minutes Intravenous Every 12 hours 01/10/13 0915     01/10/13 0300  imipenem-cilastatin (PRIMAXIN) 500 mg in sodium chloride 0.9 % 100 mL IVPB     500 mg 200 mL/hr over 30 Minutes Intravenous Every 6 hours 01/09/13 2013     01/10/13 0200  vancomycin (VANCOCIN) IVPB 1000 mg/200 mL premix  Status:  Discontinued     1,000 mg 200 mL/hr over 60 Minutes Intravenous Every 8 hours 01/09/13 2013 01/10/13 0915   01/09/13 2000  imipenem-cilastatin (PRIMAXIN) 500 mg in sodium chloride 0.9 % 100 mL IVPB     500 mg 200 mL/hr over 30 Minutes Intravenous  Once 01/09/13 1911 01/09/13 2107   01/09/13 1645  vancomycin (VANCOCIN) IVPB 1000 mg/200 mL premix     1,000 mg 200 mL/hr over 60 Minutes Intravenous  Once 01/09/13 1644 01/09/13 1919   01/09/13 1530  piperacillin-tazobactam (ZOSYN) IVPB 3.375 g     3.375 g 12.5 mL/hr over 240 Minutes Intravenous  Once 01/09/13 1515 01/09/13 1808      Medications: Scheduled Meds: . enoxaparin (LOVENOX) injection  40 mg Subcutaneous Q24H  . famotidine  40 mg Oral BID  . feeding supplement  237 mL Oral TID WC  . imipenem-cilastatin  500 mg Intravenous Q6H  . mirtazapine  30 mg Oral QHS  . oxybutynin  5 mg Oral TID  . sodium chloride  10-40 mL Intracatheter Q12H  . sodium chloride  3 mL Intravenous Q12H  . sodium hypochlorite   Irrigation BID  . vancomycin  1,000 mg Intravenous Q12H   Continuous Infusions: . sodium chloride 125 mL/hr at 01/11/13 2335   PRN Meds:.acetaminophen, acetaminophen, alum & mag hydroxide-simeth, ondansetron (ZOFRAN) IV, ondansetron, oxyCODONE, sodium chloride   Objective: Weight change: 0 lb (0 kg)  Intake/Output Summary (Last 24 hours) at 01/12/13 1624 Last data filed at  01/12/13 0700  Gross per 24 hour  Intake 1752.08 ml  Output   1800 ml  Net -47.92 ml   Blood pressure 111/55, pulse 106, temperature 98.6 F (37 C), temperature source Oral, resp. rate 17, height 6\' 2"  (1.88 m), weight 154 lb 5.2 oz (70 kg), SpO2 95.00%. Temp:  [98.6 F (37 C)-99.1 F (37.3 C)] 98.6 F (37 C) (04/04 0401) Pulse Rate:  [98-112] 106 (04/04 0401) Resp:  [16-27] 17 (04/04 0401) BP: (90-111)/(42-55) 111/55 mmHg (04/04 0401) SpO2:  [95 %-99 %] 95 % (04/04 0401) Weight:  [154 lb 5.2 oz (70 kg)] 154 lb 5.2 oz (70 kg) (04/04 0401)  Physical Exam: General: Alert and awake, oriented x3, not in any acute distress.  HEENT: anicteric sclera, pupils reactive to light and accommodation, EOMI, oropharynx clear and without exudate  CVS regular rate, normal r, no murmur rubs or gallops  Chest: clear to auscultation bilaterally, no wheezing, rales or rhonchi  Abdomen: soft nontender, nondistended, normal bowel sounds,  Neuro: paraplegic  MSK: skin:    Lab Results:  Recent Labs  01/10/13 2125 01/11/13 0500  WBC 8.1 10.9*  HGB 7.4* 7.8*  HCT 22.5* 24.1*  PLT 241 253    BMET  Recent Labs  01/10/13 0537 01/11/13 0500  NA 133* 136  K 3.5 3.1*  CL 98 106  CO2 23 24  GLUCOSE 127* 103*  BUN 4* 4*  CREATININE 0.38* 0.42*  CALCIUM 8.3* 7.4*    Micro Results: Recent Results (from the past 240 hour(s))  CULTURE, BLOOD (ROUTINE X 2)     Status: None   Collection Time    01/09/13  4:45 PM      Result Value Range Status   Specimen Description BLOOD RIGHT FOREARM   Final   Special Requests BOTTLES DRAWN AEROBIC AND ANAEROBIC 10CC   Final   Culture  Setup Time 01/09/2013 23:39   Final   Culture     Final   Value: STREPTOCOCCUS GROUP G     Note: Gram Stain Report Called to,Read Back By and Verified With: PEACE DORKU ON 01/10/2013 AT 10:26P BY WILEJ   Report Status 01/12/2013 FINAL   Final   Organism ID, Bacteria STREPTOCOCCUS GROUP G   Final  CULTURE, BLOOD (ROUTINE X  2)     Status: None   Collection Time    01/09/13  5:30 PM      Result Value Range Status   Specimen Description BLOOD ARM LEFT   Final   Special Requests BOTTLES DRAWN AEROBIC AND ANAEROBIC 10CC   Final   Culture  Setup Time 01/09/2013 23:39   Final   Culture     Final   Value:        BLOOD CULTURE RECEIVED NO GROWTH TO DATE CULTURE WILL BE HELD FOR 5 DAYS BEFORE ISSUING A FINAL NEGATIVE REPORT   Report Status PENDING   Incomplete  MRSA PCR SCREENING     Status: None   Collection Time    01/09/13  9:41 PM      Result Value Range Status   MRSA by PCR NEGATIVE  NEGATIVE Final   Comment:            The GeneXpert MRSA Assay (FDA     approved for NASAL specimens     only), is one component of a     comprehensive MRSA colonization     surveillance program. It is not     intended to diagnose MRSA     infection nor to guide or     monitor treatment for     MRSA infections.  GRAM STAIN     Status: None   Collection Time    01/10/13 12:46 AM      Result Value Range Status   Specimen Description ABSCESS RIGHT BUTTOCKS   Final   Special Requests Immunocompromised   Final   Gram Stain     Final   Value: FEW WBC PRESENT, PREDOMINANTLY PMN     RARE GRAM POSITIVE COCCI     Results Called toGinette Pitman [id number] [place]T   Report Status 01/10/2013 FINAL   Final  URINE CULTURE     Status: None   Collection Time    01/10/13 12:46 AM      Result Value Range Status   Specimen Description URINE, CATHETERIZED   Final   Special Requests NONE   Final   Culture  Setup Time 01/10/2013 08:50   Final   Colony Count NO GROWTH   Final   Culture NO GROWTH   Final   Report Status 01/11/2013 FINAL   Final  CULTURE, ROUTINE-ABSCESS     Status: None   Collection Time    01/10/13 12:46 AM      Result Value Range Status   Specimen Description ABSCESS RIGHT BUTTOCKS  Final   Special Requests NONE   Final   Gram Stain     Final   Value: FEW WBC PRESENT, PREDOMINANTLY PMN     NO SQUAMOUS  EPITHELIAL CELLS SEEN     RARE GRAM POSITIVE COCCI     IN PAIRS Gram Stain Report Called to,Read Back By and Verified With: Gram Stain Report Called to,Read Back By and Verified With: K REID RN 01/10/13 0300 BY C[place]T Performed at [facility]   Culture     Final   Value: FEW STAPHYLOCOCCUS AUREUS     Note: RIFAMPIN AND GENTAMICIN SHOULD NOT BE USED AS SINGLE DRUGS FOR TREATMENT OF STAPH INFECTIONS.     FEW STREPTOCOCCUS GROUP C   Report Status PENDING   Incomplete  ANAEROBIC CULTURE     Status: None   Collection Time    01/10/13  3:17 PM      Result Value Range Status   Specimen Description ABSCESS   Final   Special Requests SACRAL   Final   Gram Stain     Final   Value: RARE WBC PRESENT, PREDOMINANTLY PMN     NO SQUAMOUS EPITHELIAL CELLS SEEN     RARE GRAM POSITIVE COCCI     IN PAIRS   Culture     Final   Value: NO ANAEROBES ISOLATED; CULTURE IN PROGRESS FOR 5 DAYS   Report Status PENDING   Incomplete    Studies/Results: No results found.    Assessment/Plan: [name] [name] is a 38 y.o. male with   is paraplegic with a neurogenic bladder history of methicillin sensitive staph aureus bacteremia and pacemaker infection status post extraction and prolonged IV antibiotic therapy, history of extended spectrum beta-lactamase colonization of urine history of a large sacral decubitus ulcer with chronic osteomyelitis admitted acutely with Sirs/sepsis picture    #1 Sepsis: Urine culture actually sterile now. Blood with 1/2 Group  D. streptococcus sensitive to penicillin (could be contaminant) Abscess with Dr. [place] Muta aureus and group C Streptococcus growing growing. 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   He is going to be seen by Orthopedics today. I had started with him today about possibility of interventional radiologist G.  aspirating his hip for cell count differential and culture. He did not want to have this done and would not change any once opinions with regards to ability to pursue surgery now. Discussed his case he would like to proceed with IV antibiotics for 6 weeks at this point in time and I think this is reasonable. At emphasized to him that the most optimal way for his wound to heal would be in a skilled nursing facility with optimal wound care air mattress nutrition and IV antibiotics. Unfortunately this man had a very negative experience in his last skilled nursing facility and is very much against going to a skilled nursing facility again at this point in time.  We're waiting for formal recommendations from orthopedic surgery but it is not anticipated that any surgeries when performed on him during this hospitalization. Assuming that to be the case  I will change his imipenem to meropenem for better dosing and simplification and ask case management to arrange home IV antibiotic therapy.   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  he has seen Dr. Kelly Splinter in  the past but because of his nutritional status was not a candidate for plastic surgery at that time. I will reorder prealbumin now although it may be low in the setting of acute illness.  I spent greater than 45 minutes with the patient including greater than 50% of time in face to face counsel of the patient and in coordination of their care.  Dr. Ninetta Lights is  available on the weekend for questions.       LOS: 3 days   Acey Lav 01/12/2013, 4:24 PM

## 2013-01-12 NOTE — Progress Notes (Signed)
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 If he were to have need for cure of this area he will need a multidisciplinary approach with optimization of nutrition and assistance from a plastic surgeon ultimately. - has seen Dr. Kelly Splinter in the past and thinks he has an appointment with her this month -appreciate surgery -plan to order air mattress for d/c -appreciate wound care -?psuedomonas -prealbumin would have to be >16 for flap surgery by Dr. Kelly Splinter -called ortho for help with possible intervention in the hip area with chronic osteo  Pain- states he takes 20 mg oxycodone at home as well as PRN percocet  Appetite- added remeron for help with appetite  Hypokalemia- replete  Code Status: full Family Communication: patient at bedside Disposition Plan: tx out step down in AM if labs are good   Consultants:  none  Procedures:  none  Antibiotics:  vanc/imipenem  HPI/Subjective: hungry this AM Feeling better   Objective: Filed Vitals:   01/11/13 1527 01/11/13 2008 01/12/13 0100 01/12/13 0401  BP: 93/51 105/54 90/42 111/55  Pulse: 104 98 112 106  Temp: 98.9 F (37.2 C) 99.1 F (37.3 C) 99.1 F (37.3 C) 98.6 F (37 C)  TempSrc: Axillary Axillary Oral Oral  Resp: 16 27 16 17   Height:      Weight:    70 kg (154 lb 5.2 oz)  SpO2: 100% 99% 99% 95%    Intake/Output Summary (Last 24 hours) at 01/12/13 0817 Last data filed at 01/12/13 0700  Gross per 24 hour  Intake 3532.08 ml  Output   2800 ml  Net 732.08 ml   Filed Weights   01/10/13 0357 01/11/13 0513 01/12/13 0401  Weight: 70 kg (154 lb 5.2 oz) 70 kg (154 lb 5.2 oz) 70 kg (154 lb 5.2 oz)    Exam:   General:  A+Ox3, NAD  Cardiovascular: rrr  Respiratory: clear anterior  Abdomen: +BS, soft, NT  Musculoskeletal: multiple areas of wounds   Data Reviewed: Basic Metabolic Panel:  Recent Labs Lab 01/09/13 1419 01/10/13 0537 01/11/13 0500  NA 128* 133* 136  K 4.4 3.5 3.1*  CL 92* 98 106  CO2 26 23 24   GLUCOSE 76 127* 103*  BUN 5* 4* 4*  CREATININE 0.42* 0.38* 0.42*  CALCIUM 8.8 8.3* 7.4*   Liver Function Tests:  Recent Labs Lab 01/09/13 1419  AST 9  ALT 6  ALKPHOS 185*  BILITOT 1.5*  PROT 7.4  ALBUMIN 1.5*   No results found for this basename: LIPASE, AMYLASE,  in the last 168 hours No results found for this basename: AMMONIA,  in the last 168 hours CBC:  Recent Labs Lab 01/09/13 1419 01/10/13 0537 01/10/13 2125 01/11/13 0500  WBC 10.8* 9.8 8.1 10.9*  HGB 8.0* 6.6* 7.4* 7.8*  HCT 25.2* 21.5* 22.5* 24.1*  MCV 78.5 79.3 79.5 79.8  PLT 286 304 241 253   Cardiac Enzymes:  Recent Labs Lab 01/09/13 1527  TROPONINI <0.30   BNP (last 3 results) No results found for this basename: PROBNP,  in the last 8760 hours CBG: No  results found for this basename: GLUCAP,  in the last 168 hours  Recent Results (from the past 240 hour(s))  CULTURE, BLOOD (ROUTINE X 2)     Status: None   Collection Time    01/09/13  4:45 PM      Result Value Range Status   Specimen Description BLOOD RIGHT FOREARM   Final   Special Requests BOTTLES DRAWN AEROBIC AND ANAEROBIC 10CC   Final   Culture  Setup Time 01/09/2013 23:39   Final   Culture     Final   Value: GRAM POSITIVE COCCI IN CHAINS     Note: Gram Stain Report Called to,Read Back By and Verified With: PEACE DORKU ON 01/10/2013 AT 10:26P BY WILEJ   Report Status PENDING   Incomplete  CULTURE, BLOOD (ROUTINE X 2)     Status: None   Collection Time    01/09/13  5:30 PM      Result Value  Range Status   Specimen Description BLOOD ARM LEFT   Final   Special Requests BOTTLES DRAWN AEROBIC AND ANAEROBIC 10CC   Final   Culture  Setup Time 01/09/2013 23:39   Final   Culture     Final   Value:        BLOOD CULTURE RECEIVED NO GROWTH TO DATE CULTURE WILL BE HELD FOR 5 DAYS BEFORE ISSUING A FINAL NEGATIVE REPORT   Report Status PENDING   Incomplete  MRSA PCR SCREENING     Status: None   Collection Time    01/09/13  9:41 PM      Result Value Range Status   MRSA by PCR NEGATIVE  NEGATIVE Final   Comment:            The GeneXpert MRSA Assay (FDA     approved for NASAL specimens     only), is one component of a     comprehensive MRSA colonization     surveillance program. It is not     intended to diagnose MRSA     infection nor to guide or     monitor treatment for     MRSA infections.  GRAM STAIN     Status: None   Collection Time    01/10/13 12:46 AM      Result Value Range Status   Specimen Description ABSCESS RIGHT BUTTOCKS   Final   Special Requests Immunocompromised   Final   Gram Stain     Final   Value: FEW WBC PRESENT, PREDOMINANTLY PMN     RARE GRAM POSITIVE COCCI     Results Called toGinette Pitman [id number] [place]   Report Status 01/10/2013 FINAL   Final  URINE CULTURE     Status: None   Collection Time    01/10/13 12:46 AM      Result Value Range Status   Specimen Description URINE, CATHETERIZED   Final   Special Requests NONE   Final   Culture  Setup Time 01/10/2013 08:50   Final   Colony Count NO GROWTH   Final   Culture NO GROWTH   Final   Report Status 01/11/2013 FINAL   Final  CULTURE, ROUTINE-ABSCESS     Status: None   Collection Time    01/10/13 12:46 AM      Result Value Range Status   Specimen Description ABSCESS RIGHT BUTTOCKS   Final   Special Requests NONE   Final   Gram Stain     Final   Value:  FEW WBC PRESENT, PREDOMINANTLY PMN     NO SQUAMOUS EPITHELIAL CELLS SEEN     RARE GRAM POSITIVE COCCI     IN PAIRS Gram Stain Report  Called to,Read Back By and Verified With: Gram Stain Report Called to,Read Back By and Verified With: K REID RN 01/10/13 0300 BY [place]T Performed at [facility]   Culture FEW STREPTOCOCCUS GROUP C   Final   Report Status PENDING   Incomplete  ANAEROBIC CULTURE     Status: None   Collection Time    01/10/13  3:17 PM      Result Value Range Status   Specimen Description ABSCESS   Final   Special Requests SACRAL   Final   Gram Stain     Final   Value: RARE WBC PRESENT, PREDOMINANTLY PMN     NO SQUAMOUS EPITHELIAL CELLS SEEN     RARE GRAM POSITIVE COCCI     IN PAIRS   Culture     Final   Value: NO ANAEROBES ISOLATED; CULTURE IN PROGRESS FOR 5 DAYS   Report Status PENDING   Incomplete     Studies: Dg Chest Port 1 View  01/10/2013  *RADIOLOGY REPORT*  Clinical Data: PICC line placement  PORTABLE CHEST - 1 VIEW  Comparison: 01/09/2013  Findings: Cardiomediastinal silhouette is stable.  No acute infiltrate or pulmonary edema.  There is a right arm PICC line with tip in SVC right atrium junction.  No diagnostic pneumothorax.  IMPRESSION: Right arm PICC line with tip in SVC right atrium junction.  No diagnostic pneumothorax.   Original Report Authenticated By: Natasha Mead, M.D.     Scheduled Meds: . enoxaparin (LOVENOX) injection  40 mg Subcutaneous Q24H  . famotidine  40 mg Oral BID  . feeding supplement  237 mL Oral TID WC  . imipenem-cilastatin  500 mg Intravenous Q6H  . mirtazapine  30 mg Oral QHS  . nutrition supplement  1 packet Oral BID BM  . oxybutynin  5 mg Oral TID  . sodium chloride  10-40 mL Intracatheter Q12H  . sodium chloride  3 mL Intravenous Q12H  . sodium hypochlorite   Irrigation BID  . vancomycin  1,000 mg Intravenous Q12H   Continuous Infusions: . sodium chloride 125 mL/hr at 01/11/13 2335    Principal Problem:   SIRS (systemic inflammatory response syndrome) Active Problems:   Sacral decubitus ulcer, stage IV, s/p debridement & colostomy diversion    Paraplegia following T10spinal cord injury   Anemia   UTI (lower urinary tract infection)   Ulcer of left heel   Hematoma of right buttock s/p aspiration 01/09/2013   ESBL (extended spectrum beta-lactamase) producing bacteria infection   Sacral osteomyelitis    Time spent: 35    [facility], Elieser Tetrick  [facility] Pager [phone]. If 7PM-7AM, please contact night-coverage at [email or web address], password [facility] 01/12/2013, 8:17 AM  LOS: 3 days

## 2013-01-12 NOTE — Progress Notes (Signed)
Pt very upset with rn about his dressing change. Pt stated that rn doesn't know how to properly change his dressing and next time he will do the dressing hisself. Pt proceeded to take parts of the tape of dressing off and retape dressing and instructed rn not to never change his dressing again.

## 2013-01-12 NOTE — Progress Notes (Signed)
ANTIBIOTIC CONSULT NOTE - FOLLOW UP  Pharmacy Consult for Merrem Indication: Sepsis  Allergies  Allergen Reactions  . Other Hives    squash  . Vicodin (Hydrocodone-Acetaminophen)     Hot flushes,nausea and vomiting    Patient Measurements: Height: 6\' 2"  (188 cm) Weight: 154 lb 5.2 oz (70 kg) IBW/kg (Calculated) : 82.2  Vital Signs:   Intake/Output from previous day: 04/03 0701 - 04/04 0700 In: 3657.1 [P.O.:360; I.V.:2697.1; IV Piggyback:600] Out: 2800 [Urine:2800] Intake/Output from this shift:    Labs:  Recent Labs  01/10/13 0537 01/10/13 2125 01/11/13 0500  WBC 9.8 8.1 10.9*  HGB 6.6* 7.4* 7.8*  PLT 304 241 253  CREATININE 0.38*  --  0.42*   Estimated Creatinine Clearance: 124 ml/min (by C-G formula based on Cr of 0.42). No results found for this basename: Rolm Gala, VANCORANDOM, GENTTROUGH, GENTPEAK, GENTRANDOM, TOBRATROUGH, TOBRAPEAK, TOBRARND, AMIKACINPEAK, AMIKACINTROU, AMIKACIN,  in the last 72 hours   Assessment: Mr. Decatur is known to pharmacy from abx management for hx of recurrent UTIs, current sacral decub and ? Bacteremia. Received orders to change Primaxin to Merrem for ease of dosing at home. Low grd fever noted, WBC slightly elevated- UOP is good (SCr low, unreliable estimate of renal fn with paraplegia). Noted Primaxin was given at 1530 today.  Plan:  - Start Merrem 1gm IV q8h today at 2200 - Will monitor cx/spec/sens, renal fn and clinical status daily. - If plans are to cont Vanc, will plan to check Css trough prior to d/c home.  Thanks, Wilbert Schouten K. Allena Katz, PharmD, BCPS.  Clinical Pharmacist Pager [phone]. 01/12/2013 4:46 PM

## 2013-01-13 LAB — BASIC METABOLIC PANEL
CO2: 24 mEq/L (ref 19–32)
Calcium: 8.5 mg/dL (ref 8.4–10.5)
Chloride: 104 mEq/L (ref 96–112)
Sodium: 136 mEq/L (ref 135–145)

## 2013-01-13 LAB — CBC
Platelets: 325 10*3/uL (ref 150–400)
RBC: 3.41 MIL/uL — ABNORMAL LOW (ref 4.22–5.81)
WBC: 9.6 10*3/uL (ref 4.0–10.5)

## 2013-01-13 LAB — PREALBUMIN: Prealbumin: 3.6 mg/dL — ABNORMAL LOW (ref 17.0–34.0)

## 2013-01-13 NOTE — Progress Notes (Signed)
ANTIBIOTIC CONSULT NOTE - FOLLOW UP  Pharmacy Consult for Vanc/Merrem Indication: Sepsis  Allergies  Allergen Reactions  . Other Hives    squash  . Vicodin (Hydrocodone-Acetaminophen)     Hot flushes,nausea and vomiting    Patient Measurements: Height: 6\' 2"  (188 cm) Weight: 163 lb 2.3 oz (74 kg) IBW/kg (Calculated) : 82.2  Vital Signs: Temp: 98.1 F (36.7 C) (04/05 0906) Temp src: Oral (04/05 0906) BP: 110/59 mmHg (04/05 0906) Pulse Rate: 87 (04/05 0906) Intake/Output from previous day: 04/04 0701 - 04/05 0700 In: 3720 [I.V.:3020; IV Piggyback:700] Out: 3600 [Urine:3600] Intake/Output from this shift: Total I/O In: 598 [P.O.:220; I.V.:378] Out: 650 [Urine:650]  Labs:  Recent Labs  01/10/13 2125 01/11/13 0500 01/13/13 0236  WBC 8.1 10.9* 9.6  HGB 7.4* 7.8* 8.8*  PLT 241 253 325  CREATININE  --  0.42* 0.58   Estimated Creatinine Clearance: 131 ml/min (by C-G formula based on Cr of 0.58). No results found for this basename: Rolm Gala, VANCORANDOM, GENTTROUGH, GENTPEAK, GENTRANDOM, TOBRATROUGH, TOBRAPEAK, TOBRARND, AMIKACINPEAK, AMIKACINTROU, AMIKACIN,  in the last 72 hours   Assessment: [name] [name] is known to pharmacy from abx management for hx of recurrent UTIs, current sacral decub and ? Bacteremia. Currently on Vanc and Merrem. Primaxin changed to Merrem for ease of dosing at home. Low grd fever noted, WBC wnl- UOP is good (SCr low, unreliable estimate of renal fn with paraplegia).  Abx: LVQ 3/10 >> 3/20 primaxin 4/1 pm >>4/4 Merream 4/4>> Zosyn x 1 4/1 @ 5pm vanc 4/1 pm >>   Plan:  - Continue Merrem 1gm IV q8h  - Continue Vancomycin 1gm IV q12h - Obtain Vanc trough 30 minutes prior to tonight's dose (~2330) - Will monitor cx/spec/sens, renal fn and clinical status daily.  Thanks, [employer], Pharm.D. Clinical Pharmacist   Pager: [phone] 01/13/2013 11:07 AM
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
## 2013-01-14 DIAGNOSIS — B954 Other streptococcus as the cause of diseases classified elsewhere: Secondary | ICD-10-CM

## 2013-01-14 DIAGNOSIS — N179 Acute kidney failure, unspecified: Secondary | ICD-10-CM

## 2013-01-14 LAB — VANCOMYCIN, TROUGH: Vancomycin Tr: 15.3 ug/mL (ref 10.0–20.0)

## 2013-01-14 NOTE — Progress Notes (Signed)
ANTIBIOTIC CONSULT NOTE - FOLLOW UP  Pharmacy Consult for Vancomycin Indication: Sepsis  Allergies  Allergen Reactions  . Other Hives    squash  . Vicodin (Hydrocodone-Acetaminophen)     Hot flushes,nausea and vomiting    Patient Measurements: Height: 6\' 2"  (188 cm) Weight: 163 lb 2.3 oz (74 kg) IBW/kg (Calculated) : 82.2  Vital Signs: Temp: 98.6 F (37 C) (04/05 2104) Temp src: Oral (04/05 2104) BP: 111/68 mmHg (04/05 2104) Pulse Rate: 97 (04/05 2104) Intake/Output from previous day: 04/05 0701 - 04/06 0700 In: 598 [P.O.:220; I.V.:378] Out: 2500 [Urine:2500] Intake/Output from this shift: Total I/O In: -  Out: 1350 [Urine:1350]  Labs:  Recent Labs  01/11/13 0500 01/13/13 0236  WBC 10.9* 9.6  HGB 7.8* 8.8*  PLT 253 325  CREATININE 0.42* 0.58   Estimated Creatinine Clearance: 131 ml/min (by C-G formula based on Cr of 0.58).  Recent Labs  01/13/13 2330  VANCOTROUGH 15.3     Assessment: [name] [name] is known to pharmacy from abx management for hx of recurrent UTIs, current sacral decub and ? Bacteremia. Currently on Vanc and Merrem. Primaxin changed to Merrem for ease of dosing at home.   Vancomycin trough (15.3) is at-goal on 1gm IV Q12H.   Abx: LVQ 3/10 >> 3/20 Primaxin 4/1 pm >>4/4 Merrem 4/4>> Zosyn x 1 4/1 @ 5pm Vanc 4/1 pm >>  Plan:  1. Continue IV vancomycin 1gm IV Q12H.    Lorre Munroe, PharmD 01/14/2013 1:25 AM

## 2013-01-14 NOTE — Progress Notes (Signed)
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 If he were to have need for cure of this area he will need a multidisciplinary approach with optimization of nutrition and assistance from a plastic surgeon ultimately. - has seen Dr. Kelly Splinter in the past and thinks he has an appointment with her this month -plan to order air mattress for d/c -appreciate wound care -prealbumin would have to be >16 for flap surgery by Dr. Kelly Splinter- improved to 3.6 -called ortho for help with possible intervention in the hip area with chronic osteo- patient refused the recommended CT guided aspiration of hip  Pain- states he takes 20 mg oxycodone at home as well as PRN percocet  Appetite- added remeron for help with appetite  Hypokalemia- replete  Spoke at length with patient regarding intervention and need for long term abx- initially he refused SNF but is not agreeable to see as he does not think he mother would give him abx at home.  Spoke with her   Code Status: full Family Communication: patient at bedside Disposition Plan: home health abx vs SNF   Consultants:  none  Procedures:  none  Antibiotics:  vanc/imipenem  HPI/Subjective: wanting to go home and get abx   Objective: Filed Vitals:   01/13/13 1756 01/13/13 2104 01/14/13 0140 01/14/13 0554  BP: 110/56 111/68 99/58 113/56  Pulse: 90 97 97 88  Temp: 98.5 F (36.9 C) 98.6 F (37 C) 99.8 F (37.7 C) 98.6 F (37 C)  TempSrc:  Oral  Oral Oral  Resp: 20 18 18 16   Height:      Weight:      SpO2: 100% 100% 100% 100%    Intake/Output Summary (Last 24 hours) at 01/14/13 0944 Last data filed at 01/14/13 0554  Gross per 24 hour  Intake    348 ml  Output   3650 ml  Net  -3302 ml   Filed Weights   01/11/13 0513 01/12/13 0401 01/13/13 0445  Weight: 70 kg (154 lb 5.2 oz) 70 kg (154 lb 5.2 oz) 74 kg (163 lb 2.3 oz)    Exam:   General:  A+Ox3, NAD  Cardiovascular: rrr  Respiratory: clear anterior  Abdomen: +BS, soft, NT  Musculoskeletal: multiple areas of wounds   Data Reviewed: Basic Metabolic Panel:  Recent Labs Lab 01/09/13 1419 01/10/13 0537 01/11/13 0500 01/13/13 0236  NA 128* 133* 136 136  K 4.4 3.5 3.1* 4.1  CL 92* 98 106 104  CO2 26 23 24 24   GLUCOSE 76 127* 103* 99  BUN 5* 4* 4* 5*  CREATININE 0.42* 0.38* 0.42* 0.58  CALCIUM 8.8 8.3* 7.4* 8.5   Liver Function Tests:  Recent Labs Lab 01/09/13 1419  AST 9  ALT 6  ALKPHOS 185*  BILITOT 1.5*  PROT 7.4  ALBUMIN 1.5*   No results found for this basename: LIPASE, AMYLASE,  in the last 168 hours No results found for this basename: AMMONIA,  in the last 168 hours CBC:  Recent Labs  Lab 01/09/13 1419 01/10/13 0537 01/10/13 2125 01/11/13 0500 01/13/13 0236  WBC 10.8* 9.8 8.1 10.9* 9.6  HGB 8.0* 6.6* 7.4* 7.8* 8.8*  HCT 25.2* 21.5* 22.5* 24.1* 27.6*  MCV 78.5 79.3 79.5 79.8 80.9  PLT 286 304 241 253 325   Cardiac Enzymes:  Recent Labs Lab 01/09/13 1527  TROPONINI <0.30   BNP (last 3 results) No results found for this basename: PROBNP,  in the last 8760 hours CBG: No results found for this basename: GLUCAP,  in the last 168 hours  Recent Results (from the past 240 hour(s))  CULTURE, BLOOD (ROUTINE X 2)     Status: None   Collection Time    01/09/13  4:45 PM      Result Value Range Status   Specimen Description BLOOD RIGHT FOREARM   Final   Special Requests BOTTLES DRAWN AEROBIC AND ANAEROBIC 10CC   Final   Culture   Setup Time 01/09/2013 23:39   Final   Culture     Final   Value: STREPTOCOCCUS GROUP G     Note: Gram Stain Report Called to,Read Back By and Verified With: PEACE DORKU ON 01/10/2013 AT 10:26P BY WILEJ   Report Status 01/12/2013 FINAL   Final   Organism ID, Bacteria STREPTOCOCCUS GROUP G   Final  CULTURE, BLOOD (ROUTINE X 2)     Status: None   Collection Time    01/09/13  5:30 PM      Result Value Range Status   Specimen Description BLOOD ARM LEFT   Final   Special Requests BOTTLES DRAWN AEROBIC AND ANAEROBIC 10CC   Final   Culture  Setup Time 01/09/2013 23:39   Final   Culture     Final   Value:        BLOOD CULTURE RECEIVED NO GROWTH TO DATE CULTURE WILL BE HELD FOR 5 DAYS BEFORE ISSUING A FINAL NEGATIVE REPORT   Report Status PENDING   Incomplete  MRSA PCR SCREENING     Status: None   Collection Time    01/09/13  9:41 PM      Result Value Range Status   MRSA by PCR NEGATIVE  NEGATIVE Final   Comment:            The GeneXpert MRSA Assay (FDA     approved for NASAL specimens     only), is one component of a     comprehensive MRSA colonization     surveillance program. It is not     intended to diagnose MRSA     infection nor to guide or     monitor treatment for     MRSA infections.  GRAM STAIN     Status: None   Collection Time    01/10/13 12:46 AM      Result Value Range Status   Specimen Description ABSCESS RIGHT BUTTOCKS   Final   Special Requests Immunocompromised   Final   Gram Stain     Final   Value: FEW WBC PRESENT, PREDOMINANTLY PMN     RARE GRAM POSITIVE COCCI     Results Called toGinette Pitman [id number] C[place]T   Report Status 01/10/2013 FINAL   Final  URINE CULTURE     Status: None   Collection Time    01/10/13 12:46 AM      Result Value Range Status   Specimen Description URINE, CATHETERIZED   Final   Special Requests NONE   Final   Culture  Setup  Time 01/10/2013 08:50   Final   Colony Count NO GROWTH   Final   Culture NO GROWTH   Final   Report  Status 01/11/2013 FINAL   Final  CULTURE, ROUTINE-ABSCESS     Status: None   Collection Time    01/10/13 12:46 AM      Result Value Range Status   Specimen Description ABSCESS RIGHT BUTTOCKS   Final   Special Requests NONE   Final   Gram Stain     Final   Value: FEW WBC PRESENT, PREDOMINANTLY PMN     NO SQUAMOUS EPITHELIAL CELLS SEEN     RARE GRAM POSITIVE COCCI     IN PAIRS Gram Stain Report Called to,Read Back By and Verified With: Gram Stain Report Called to,Read Back By and Verified With: K REID RN 01/10/13 0300 BY C[place]T Performed at [facility]   Culture     Final   Value: FEW STAPHYLOCOCCUS AUREUS     Note: RIFAMPIN AND GENTAMICIN SHOULD NOT BE USED AS SINGLE DRUGS FOR TREATMENT OF STAPH INFECTIONS.     FEW STREPTOCOCCUS GROUP C   Report Status PENDING   Incomplete  ANAEROBIC CULTURE     Status: None   Collection Time    01/10/13  3:17 PM      Result Value Range Status   Specimen Description ABSCESS   Final   Special Requests SACRAL   Final   Gram Stain     Final   Value: RARE WBC PRESENT, PREDOMINANTLY PMN     NO SQUAMOUS EPITHELIAL CELLS SEEN     RARE GRAM POSITIVE COCCI     IN PAIRS   Culture     Final   Value: NO ANAEROBES ISOLATED; CULTURE IN PROGRESS FOR 5 DAYS   Report Status PENDING   Incomplete     Studies: No results found.  Scheduled Meds: . enoxaparin (LOVENOX) injection  40 mg Subcutaneous Q24H  . famotidine  40 mg Oral BID  . feeding supplement  237 mL Oral TID WC  . meropenem (MERREM) IV  1 g Intravenous Q8H  . mirtazapine  30 mg Oral QHS  . oxybutynin  5 mg Oral TID  . sodium chloride  10-40 mL Intracatheter Q12H  . sodium chloride  3 mL Intravenous Q12H  . vancomycin  1,000 mg Intravenous Q12H   Continuous Infusions:    Principal Problem:   SIRS (systemic inflammatory response syndrome) Active Problems:   Sacral decubitus ulcer, stage IV, s/p debridement & colostomy diversion   Paraplegia following T10spinal cord injury    Anemia   UTI (lower urinary tract infection)   Ulcer of left heel   Hematoma of right buttock s/p aspiration 01/09/2013   ESBL (extended spectrum beta-lactamase) producing bacteria infection   Sacral osteomyelitis    Time spent: 35    [facility], Rudolpho Claxton  [facility] Pager [phone]. If 7PM-7AM, please contact night-coverage at [email or web address], password [facility] 01/14/2013, 9:44 AM  LOS: 5 days

## 2013-01-14 NOTE — Evaluation (Signed)
Physical Therapy Evaluation Patient Details Name: [name] [name] MRN: [id number] DOB: 06-30-1976 Today's Date: 01/14/2013 Time: 4098-1191 PT Time Calculation (min): 16 min  PT Assessment / Plan / Recommendation Clinical Impression  Pt is a 37 yo male who is a T10 paraplegic from [facility] 15 years ago. 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 Spoke at length with patient and mother regarding patient physical changes over the last 15 years, current equip, home set up and new medical patient required to assist in managing patients skin breakdown including his stage 4 sacral decub. Pt with original w/c from 15 yrs ago and only a foam cushion. Due to patients decub ulcer patient requies a ROHO cushion. Patient should also be re-fitted for an updated w/c due to physical changes and skin breakdown over the last 15 years. Pt with no skilled acute care PT needs at this time as he is functioning at baseline from a mobility standpoint; however he does need update equipment to address his skin break down needs. Pt also to benefit from an air mattress for hospital bed at home. Pt to benefit from HHPT assessment due to mother reporting in accessible bathroom and small house. HHPT to addresss home set up and provide recommendation for optimal set up for patient. Spoke with Dr. Benjamine Mola regarding PT recommendations and left message with case manager. Acute PT signing off at this time. Please re-consult if needed in future.    PT Assessment  Patent does not need any further PT services    Follow Up Recommendations  Home health PT;Supervision - Intermittent (to address home set up and optimal equip)    Does the patient have the potential to tolerate intense rehabilitation      Barriers to Discharge        Equipment Recommendations  Wheelchair cushion (measurements PT);Wheelchair (measurements PT) (ROHO cushion - pt needs to be re-fitted for w/c, air mattress for hospital bed at home)    Recommendations  for Other Services     Frequency      Precautions / Restrictions Precautions Precautions:  (stage 4 sacral decub - awaiting surgery) Restrictions Weight Bearing Restrictions: No   Pertinent Vitals/Pain Denies pain      Mobility  Bed Mobility Bed Mobility: Supine to Sit ((long sit)) Supine to Sit: 6: Modified independent (Device/Increase time);With rails Transfers Transfers: Not assessed (pt refused to get to chair, and w/c not avail) Ambulation/Gait Ambulation/Gait Assistance:  (pt non-ambulatory) Wheelchair Mobility Wheelchair Mobility: No (his w/c not present and defered using one of the hospitals)    Exercises     PT Diagnosis:    PT Problem List:   PT Treatment Interventions:     PT Goals Acute Rehab PT Goals PT Goal Formulation:  (n/a)  Visit Information  Last PT Received On: 01/14/13 Assistance Needed: +1    Subjective Data  Subjective: Pt received supine in bed with report "I need a roho cushion." Patient Stated Goal: home   Prior Functioning  Home Living Lives With: Family (mother) Available Help at Discharge: Family;Available PRN/intermittently Type of Home: House Home Access: Stairs to enter [employer] of Steps: 1 Home Layout: One level Bathroom Shower/Tub: Engineer, manufacturing systems: Standard Bathroom Accessibility: No Home Adaptive Equipment: Wheelchair - manual;Hospital bed Additional Comments: pt was incarcerated and recently let out 06/2012 Prior Function Level of Independence: Independent with assistive device(s) Able to Take Stairs?: No Driving: No Vocation:  (recently incarcerate) Communication Communication: No difficulties Dominant Hand: Right  Cognition  Cognition Overall Cognitive Status: Appears within functional limits for tasks assessed/performed Arousal/Alertness: Awake/alert Orientation Level: Appears intact for tasks assessed Behavior During Session: [facility] for tasks performed    Extremity/Trunk  Assessment Right Upper Extremity Assessment RUE ROM/Strength/Tone: [facility] for tasks assessed Left Upper Extremity Assessment LUE ROM/Strength/Tone: WFL for tasks assessed Right Lower Extremity Assessment RLE ROM/Strength/Tone:  (paraplegic ) RLE Sensation:  (paralyzed T-10 below) Left Lower Extremity Assessment LLE ROM/Strength/Tone:  (pt paraplegic) LLE Sensation:  (paralyzed T10 and below) Trunk Assessment Trunk Assessment: Normal   Balance    End of Session PT - End of Session Activity Tolerance: Treatment limited secondary to agitation Patient left: in bed;with call bell/phone within reach;with family/visitor present Nurse Communication: Mobility status  GP     Marcene Brawn 01/14/2013, 4:32 PM  Lewis Shock, PT, DPT Pager #: [phone] Office #: [phone]

## 2013-01-15 ENCOUNTER — Encounter (HOSPITAL_BASED_OUTPATIENT_CLINIC_OR_DEPARTMENT_OTHER): Payer: Medicaid Other | Attending: Plastic Surgery

## 2013-01-15 LAB — CULTURE, ROUTINE-ABSCESS

## 2013-01-15 LAB — ANAEROBIC CULTURE

## 2013-01-15 LAB — CULTURE, BLOOD (ROUTINE X 2): Culture: NO GROWTH

## 2013-01-15 NOTE — Progress Notes (Signed)
Agree with above, back to saline dressings, would also be helpful to minimize tape by possibly using mesh underwear to hold in place also

## 2013-01-15 NOTE — Progress Notes (Signed)
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 If he were to have need for cure of this area he will need a multidisciplinary approach with optimization of nutrition and assistance from a plastic surgeon ultimately. - has seen Dr. Kelly Splinter in the past and thinks he has an appointment with her this month -plan to order air mattress for d/c -appreciate wound care -prealbumin would have to be >16 for flap surgery by Dr. Kelly Splinter- improved to 3.6 -called ortho for help with possible intervention in the hip area with chronic osteo- patient refused the recommended CT guided aspiration of hip  Pain- states he takes 20 mg oxycodone at home as well as PRN percocet  Appetite- added remeron for help with appetite  Hypokalemia- replete  Spoke at length with patient regarding intervention and need for long term abx- he refused SNF.  Asked case manager to make arrangements for IV abx at home.  Will need mattress overlay and ROHO cushion  Code Status: full Family Communication: patient at bedside Disposition Plan: home health abx vs SNF   Consultants:  none  Procedures:  none  Antibiotics:  vanc/imipenem  HPI/Subjective: wanting to go home and get abx   Objective: Filed Vitals:   01/14/13 0554 01/14/13 1345 01/14/13 2130 01/15/13 0516  BP: 113/56 96/53 107/65 112/64  Pulse: 88 92 110 108  Temp: 98.6 F (37 C) 98.7 F (37.1 C) 100.4 F (38 C) 97.8 F (36.6 C)  TempSrc: Oral Oral Oral Oral  Resp: 16 17 16 16   Height:       Weight:      SpO2: 100% 100% 98% 100%    Intake/Output Summary (Last 24 hours) at 01/15/13 1053 Last data filed at 01/15/13 0900  Gross per 24 hour  Intake    240 ml  Output   2950 ml  Net  -2710 ml   Filed Weights   01/11/13 0513 01/12/13 0401 01/13/13 0445  Weight: 70 kg (154 lb 5.2 oz) 70 kg (154 lb 5.2 oz) 74 kg (163 lb 2.3 oz)    Exam:   General:  A+Ox3, NAD  Cardiovascular: rrr  Respiratory: clear anterior  Abdomen: +BS, soft, NT  Musculoskeletal: multiple areas of wounds   Data Reviewed: Basic Metabolic Panel:  Recent Labs Lab 01/09/13 1419 01/10/13 0537 01/11/13 0500 01/13/13 0236  NA 128* 133* 136 136  K 4.4 3.5 3.1* 4.1  CL 92* 98 106 104  CO2 26 23 24 24   GLUCOSE 76 127* 103* 99  BUN 5* 4* 4* 5*  CREATININE 0.42* 0.38* 0.42* 0.58  CALCIUM 8.8 8.3* 7.4* 8.5   Liver Function Tests:  Recent Labs Lab 01/09/13 1419  AST 9  ALT 6  ALKPHOS 185*  BILITOT 1.5*  PROT 7.4  ALBUMIN 1.5*   No results found for this basename: LIPASE, AMYLASE,  in the last 168 hours No results found for this basename: AMMONIA,  in the last 168 hours CBC:  Recent Labs Lab 01/09/13 1419 01/10/13 0537 01/10/13 2125 01/11/13 0500 01/13/13 0236  WBC 10.8*  9.8 8.1 10.9* 9.6  HGB 8.0* 6.6* 7.4* 7.8* 8.8*  HCT 25.2* 21.5* 22.5* 24.1* 27.6*  MCV 78.5 79.3 79.5 79.8 80.9  PLT 286 304 241 253 325   Cardiac Enzymes:  Recent Labs Lab 01/09/13 1527  TROPONINI <0.30   BNP (last 3 results) No results found for this basename: PROBNP,  in the last 8760 hours CBG: No results found for this basename: GLUCAP,  in the last 168 hours  Recent Results (from the past 240 hour(s))  CULTURE, BLOOD (ROUTINE X 2)     Status: None   Collection Time    01/09/13  4:45 PM      Result Value Range Status   Specimen Description BLOOD RIGHT FOREARM   Final   Special Requests BOTTLES DRAWN AEROBIC AND ANAEROBIC 10CC   Final   Culture  Setup Time 01/09/2013 23:39   Final    Culture     Final   Value: STREPTOCOCCUS GROUP G     Note: Gram Stain Report Called to,Read Back By and Verified With: PEACE DORKU ON 01/10/2013 AT 10:26P BY WILEJ   Report Status 01/12/2013 FINAL   Final   Organism ID, Bacteria STREPTOCOCCUS GROUP G   Final  CULTURE, BLOOD (ROUTINE X 2)     Status: None   Collection Time    01/09/13  5:30 PM      Result Value Range Status   Specimen Description BLOOD ARM LEFT   Final   Special Requests BOTTLES DRAWN AEROBIC AND ANAEROBIC 10CC   Final   Culture  Setup Time 01/09/2013 23:39   Final   Culture NO GROWTH 5 DAYS   Final   Report Status 01/15/2013 FINAL   Final  MRSA PCR SCREENING     Status: None   Collection Time    01/09/13  9:41 PM      Result Value Range Status   MRSA by PCR NEGATIVE  NEGATIVE Final   Comment:            The GeneXpert MRSA Assay (FDA     approved for NASAL specimens     only), is one component of a     comprehensive MRSA colonization     surveillance program. It is not     intended to diagnose MRSA     infection nor to guide or     monitor treatment for     MRSA infections.  GRAM STAIN     Status: None   Collection Time    01/10/13 12:46 AM      Result Value Range Status   Specimen Description ABSCESS RIGHT BUTTOCKS   Final   Special Requests Immunocompromised   Final   Gram Stain     Final   Value: FEW WBC PRESENT, PREDOMINANTLY PMN     RARE GRAM POSITIVE COCCI     Results Called toGinette Pitman [id number] CLEVELANDT   Report Status 01/10/2013 FINAL   Final  URINE CULTURE     Status: None   Collection Time    01/10/13 12:46 AM      Result Value Range Status   Specimen Description URINE, CATHETERIZED   Final   Special Requests NONE   Final   Culture  Setup Time 01/10/2013 08:50   Final   Colony Count NO GROWTH   Final   Culture NO GROWTH   Final   Report Status 01/11/2013 FINAL   Final  CULTURE, ROUTINE-ABSCESS     Status: None  Collection Time    01/10/13 12:46 AM      Result Value Range Status    Specimen Description ABSCESS RIGHT BUTTOCKS   Final   Special Requests NONE   Final   Gram Stain     Final   Value: FEW WBC PRESENT, PREDOMINANTLY PMN     NO SQUAMOUS EPITHELIAL CELLS SEEN     RARE GRAM POSITIVE COCCI     IN PAIRS Gram Stain Report Called to,Read Back By and Verified With: Gram Stain Report Called to,Read Back By and Verified With: K REID RN 01/10/13 0300 BY CLEVELANDT Performed at [facility]   Culture     Final   Value: FEW METHICILLIN RESISTANT STAPHYLOCOCCUS AUREUS     Note: RIFAMPIN AND GENTAMICIN SHOULD NOT BE USED AS SINGLE DRUGS FOR TREATMENT OF STAPH INFECTIONS. This organism DOES NOT demonstrate inducible Clindamycin resistance in vitro. CRITICAL RESULT CALLED TO, READ BACK BY AND VERIFIED WITH: TASHLYN @ 10:45AM       01/13/13 BY DWEEKS     FEW STREPTOCOCCUS GROUP G     Note: CORRECTED RESULTS CALLED TO: TASHLYN @ 10:54AM  01/13/13 BY DWEEKS PREVIOUSLY REPORTED AS STREPTOCOCCUS GROUP C   Report Status 01/15/2013 FINAL   Final   Organism ID, Bacteria METHICILLIN RESISTANT STAPHYLOCOCCUS AUREUS   Final  ANAEROBIC CULTURE     Status: None   Collection Time    01/10/13  3:17 PM      Result Value Range Status   Specimen Description ABSCESS   Final   Special Requests SACRAL   Final   Gram Stain     Final   Value: RARE WBC PRESENT, PREDOMINANTLY PMN     NO SQUAMOUS EPITHELIAL CELLS SEEN     RARE GRAM POSITIVE COCCI     IN PAIRS   Culture NO ANAEROBES ISOLATED   Final   Report Status 01/15/2013 FINAL   Final  CULTURE, BLOOD (ROUTINE X 2)     Status: None   Collection Time    01/13/13  2:55 AM      Result Value Range Status   Specimen Description BLOOD LEFT ARM   Final   Special Requests BOTTLES DRAWN AEROBIC AND ANAEROBIC 10CC   Final   Culture  Setup Time 01/13/2013 12:43   Final   Culture     Final   Value:        BLOOD CULTURE RECEIVED NO GROWTH TO DATE CULTURE WILL BE HELD FOR 5 DAYS BEFORE ISSUING A FINAL NEGATIVE REPORT   Report Status PENDING    Incomplete  CULTURE, BLOOD (ROUTINE X 2)     Status: None   Collection Time    01/13/13  3:05 AM      Result Value Range Status   Specimen Description BLOOD LEFT ARM   Final   Special Requests BOTTLES DRAWN AEROBIC ONLY 3CC   Final   Culture  Setup Time 01/13/2013 12:43   Final   Culture     Final   Value:        BLOOD CULTURE RECEIVED NO GROWTH TO DATE CULTURE WILL BE HELD FOR 5 DAYS BEFORE ISSUING A FINAL NEGATIVE REPORT   Report Status PENDING   Incomplete     Studies: No results found.  Scheduled Meds: . enoxaparin (LOVENOX) injection  40 mg Subcutaneous Q24H  . famotidine  40 mg Oral BID  . feeding supplement  237 mL Oral TID WC  . meropenem (MERREM) IV  1 g  Intravenous Q8H  . mirtazapine  30 mg Oral QHS  . oxybutynin  5 mg Oral TID  . sodium chloride  10-40 mL Intracatheter Q12H  . sodium chloride  3 mL Intravenous Q12H  . vancomycin  1,000 mg Intravenous Q12H   Continuous Infusions:    Principal Problem:   SIRS (systemic inflammatory response syndrome) Active Problems:   Sacral decubitus ulcer, stage IV, s/p debridement & colostomy diversion   Paraplegia following T10spinal cord injury   Anemia   UTI (lower urinary tract infection)   Ulcer of left heel   Hematoma of right buttock s/p aspiration 01/09/2013   ESBL (extended spectrum beta-lactamase) producing bacteria infection   Sacral osteomyelitis    Time spent: 35    [facility], JESSICA  [facility] Pager [phone]. If 7PM-7AM, please contact night-coverage at [email or web address], password [facility] 01/15/2013, 10:53 AM  LOS: 6 days

## 2013-01-15 NOTE — Progress Notes (Signed)
Subjective: Pt. Anxious to go home.  Nurses brought to our attention that he has not been allowing the nurses to change his sacral wound.  Appetite is fair.  No chest pains, shortness of breath.    Objective: Vital signs in last 24 hours: Temp:  [97.8 F (36.6 C)-100.4 F (38 C)] 97.8 F (36.6 C) (04/07 0516) Pulse Rate:  [92-110] 108 (04/07 0516) Resp:  [16-17] 16 (04/07 0516) BP: (96-112)/(53-65) 112/64 mmHg (04/07 0516) SpO2:  [98 %-100 %] 100 % (04/07 0516) Last BM Date: 01/14/13  Intake/Output from previous day: 04/06 0701 - 04/07 0700 In: -  Out: 2950 [Urine:2950] Intake/Output this shift: Total I/O In: 240 [P.O.:240] Out: -   General appearance: alert, cooperative and no distress Resp: clear to auscultation bilaterally Cardio: regular rate and rhythm, S1, S2 normal, no murmur, click, rub or gallop GI: soft, non-tender; bowel sounds normal; no masses,  no organomegaly Extremities: extremities normal, atraumatic, no cyanosis or edema Skin: Skin color, texture, turgor normal. No rashes or lesions or sacrum, tunneling left posterior thigh, no signs of colonization, pink, good granulation, moderate amount of serosanginous discharge on dressing.  Dressing changed, packed with curlix, covered with abd pain and tape  Incision/Wound:  Lab Results:   Recent Labs  01/13/13 0236  WBC 9.6  HGB 8.8*  HCT 27.6*  PLT 325   BMET  Recent Labs  01/13/13 0236  NA 136  K 4.1  CL 104  CO2 24  GLUCOSE 99  BUN 5*  CREATININE 0.58  CALCIUM 8.5    Studies/Results: No results found.  Anti-infectives: Anti-infectives   Start     Dose/Rate Route Frequency Ordered Stop   01/12/13 2200  meropenem (MERREM) 1 g in sodium chloride 0.9 % 100 mL IVPB     1 g 200 mL/hr over 30 Minutes Intravenous 3 times per day 01/12/13 1641     01/10/13 1200  vancomycin (VANCOCIN) IVPB 1000 mg/200 mL premix     1,000 mg 200 mL/hr over 60 Minutes Intravenous Every 12 hours 01/10/13 0915      01/10/13 0300  imipenem-cilastatin (PRIMAXIN) 500 mg in sodium chloride 0.9 % 100 mL IVPB  Status:  Discontinued     500 mg 200 mL/hr over 30 Minutes Intravenous Every 6 hours 01/09/13 2013 01/12/13 1630   01/10/13 0200  vancomycin (VANCOCIN) IVPB 1000 mg/200 mL premix  Status:  Discontinued     1,000 mg 200 mL/hr over 60 Minutes Intravenous Every 8 hours 01/09/13 2013 01/10/13 0915   01/09/13 2000  imipenem-cilastatin (PRIMAXIN) 500 mg in sodium chloride 0.9 % 100 mL IVPB     500 mg 200 mL/hr over 30 Minutes Intravenous  Once 01/09/13 1911 01/09/13 2107   01/09/13 1645  vancomycin (VANCOCIN) IVPB 1000 mg/200 mL premix     1,000 mg 200 mL/hr over 60 Minutes Intravenous  Once 01/09/13 1644 01/09/13 1919   01/09/13 1530  piperacillin-tazobactam (ZOSYN) IVPB 3.375 g     3.375 g 12.5 mL/hr over 240 Minutes Intravenous  Once 01/09/13 1515 01/09/13 1808      Assessment/Plan: Chronic Sacral Wound:  S/p debridement. 1. Improved, no further colonization.  Good granulation tissue.  Discontinue dakin.   2. Continue with packing, dressing changes twice daily. 3. Maximize nutirtion, Ensure 4x per day.   4. Air mattress at home and care management following discharge. 5. Consider hydrotherapy if above therapies fail 6. VTE propphylaxis- lovenox, SCDs.  Active problems managed by Internal Medicine SIRS (systemic inflammatory response  syndrome)   Paraplegia following T10 spinal cord injury  Anemia  UTI (lower urinary tract infection)  Ulcer of left heel  Hematoma of right buttock s/p aspiration 01/09/2013  ESBL (extended spectrum beta-lactamase) producing bacteria infection  Sacral osteomyelitis    LOS: 6 days    Oluwatimilehin Balfour ANP-BC 01/15/2013

## 2013-01-16 MED ORDER — MIRTAZAPINE 30 MG PO TABS: 30.0000 mg | ORAL_TABLET | Freq: Every day | ORAL | Status: DC

## 2013-01-16 MED ORDER — SODIUM CHLORIDE 0.9 % IV SOLN: INTRAVENOUS | Status: DC

## 2013-01-16 MED ORDER — VANCOMYCIN HCL IN DEXTROSE 1-5 GM/200ML-% IV SOLN: 1000.0000 mg | Freq: Two times a day (BID) | INTRAVENOUS | Status: DC

## 2013-01-16 MED ORDER — HEPARIN SOD (PORK) LOCK FLUSH 100 UNIT/ML IV SOLN
250.0000 [IU] | INTRAVENOUS | Status: AC | PRN
  Administered 2013-01-16: 500 [IU]

## 2013-01-16 MED ORDER — OXYCODONE HCL 10 MG PO TABS: 10.0000 mg | ORAL_TABLET | ORAL | Status: DC | PRN
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
## 2013-01-16 NOTE — Clinical Social Work Note (Signed)
Clinical Social Worker received inappropriate referral for SNF placement for patient. Patient was evaluated by PT and recommended home health. CSW will sign off, as social work intervention is no longer needed.   Rozetta Nunnery MSW, [employer] [phone]

## 2013-01-19 LAB — CULTURE, BLOOD (ROUTINE X 2): Culture: NO GROWTH

## 2013-01-30 ENCOUNTER — Encounter (HOSPITAL_COMMUNITY): Payer: Self-pay | Admitting: Nurse Practitioner

## 2013-01-30 ENCOUNTER — Other Ambulatory Visit: Payer: Self-pay

## 2013-01-30 ENCOUNTER — Inpatient Hospital Stay (HOSPITAL_COMMUNITY)
Admission: EM | Admit: 2013-01-30 | Discharge: 2013-02-02 | DRG: 811 | Disposition: A | Discharge status: Home / Self Care | Payer: Medicaid Other | Attending: Internal Medicine | Admitting: Internal Medicine

## 2013-01-30 ENCOUNTER — Emergency Department (HOSPITAL_COMMUNITY): Payer: Medicaid Other

## 2013-01-30 DIAGNOSIS — L8994 Pressure ulcer of unspecified site, stage 4: Secondary | ICD-10-CM | POA: Diagnosis present

## 2013-01-30 DIAGNOSIS — Z905 Acquired absence of kidney: Secondary | ICD-10-CM

## 2013-01-30 DIAGNOSIS — L89109 Pressure ulcer of unspecified part of back, unspecified stage: Secondary | ICD-10-CM | POA: Diagnosis present

## 2013-01-30 DIAGNOSIS — M869 Osteomyelitis, unspecified: Secondary | ICD-10-CM

## 2013-01-30 DIAGNOSIS — R0602 Shortness of breath: Secondary | ICD-10-CM

## 2013-01-30 DIAGNOSIS — E860 Dehydration: Secondary | ICD-10-CM | POA: Diagnosis present

## 2013-01-30 DIAGNOSIS — N179 Acute kidney failure, unspecified: Secondary | ICD-10-CM | POA: Diagnosis present

## 2013-01-30 DIAGNOSIS — E876 Hypokalemia: Secondary | ICD-10-CM | POA: Diagnosis present

## 2013-01-30 DIAGNOSIS — L89154 Pressure ulcer of sacral region, stage 4: Secondary | ICD-10-CM | POA: Diagnosis present

## 2013-01-30 DIAGNOSIS — I959 Hypotension, unspecified: Secondary | ICD-10-CM

## 2013-01-30 DIAGNOSIS — I428 Other cardiomyopathies: Secondary | ICD-10-CM | POA: Diagnosis present

## 2013-01-30 DIAGNOSIS — G8220 Paraplegia, unspecified: Secondary | ICD-10-CM | POA: Diagnosis present

## 2013-01-30 DIAGNOSIS — F17200 Nicotine dependence, unspecified, uncomplicated: Secondary | ICD-10-CM | POA: Diagnosis present

## 2013-01-30 DIAGNOSIS — M4628 Osteomyelitis of vertebra, sacral and sacrococcygeal region: Secondary | ICD-10-CM

## 2013-01-30 DIAGNOSIS — Z95810 Presence of automatic (implantable) cardiac defibrillator: Secondary | ICD-10-CM

## 2013-01-30 DIAGNOSIS — I5022 Chronic systolic (congestive) heart failure: Secondary | ICD-10-CM

## 2013-01-30 DIAGNOSIS — W3301XA Accidental discharge of shotgun, initial encounter: Secondary | ICD-10-CM

## 2013-01-30 DIAGNOSIS — Z933 Colostomy status: Secondary | ICD-10-CM

## 2013-01-30 DIAGNOSIS — D649 Anemia, unspecified: Secondary | ICD-10-CM

## 2013-01-30 DIAGNOSIS — R000 Tachycardia, unspecified: Secondary | ICD-10-CM

## 2013-01-30 DIAGNOSIS — S300XXA Contusion of lower back and pelvis, initial encounter: Secondary | ICD-10-CM | POA: Diagnosis present

## 2013-01-30 DIAGNOSIS — D509 Iron deficiency anemia, unspecified: Secondary | ICD-10-CM | POA: Diagnosis present

## 2013-01-30 DIAGNOSIS — F1210 Cannabis abuse, uncomplicated: Secondary | ICD-10-CM | POA: Diagnosis present

## 2013-01-30 DIAGNOSIS — M8668 Other chronic osteomyelitis, other site: Secondary | ICD-10-CM | POA: Diagnosis present

## 2013-01-30 LAB — URINALYSIS, ROUTINE W REFLEX MICROSCOPIC
Glucose, UA: NEGATIVE mg/dL
Hgb urine dipstick: NEGATIVE
Ketones, ur: NEGATIVE mg/dL
pH: 6 (ref 5.0–8.0)

## 2013-01-30 LAB — COMPREHENSIVE METABOLIC PANEL
ALT: 6 U/L (ref 0–53)
AST: 10 U/L (ref 0–37)
CO2: 25 mEq/L (ref 19–32)
Chloride: 95 mEq/L — ABNORMAL LOW (ref 96–112)
Creatinine, Ser: 1.57 mg/dL — ABNORMAL HIGH (ref 0.50–1.35)
GFR calc non Af Amer: 54 mL/min — ABNORMAL LOW (ref >90)
Glucose, Bld: 103 mg/dL — ABNORMAL HIGH (ref 70–99)
Total Bilirubin: 0.7 mg/dL (ref 0.3–1.2)

## 2013-01-30 LAB — CBC WITH DIFFERENTIAL/PLATELET
Eosinophils Relative: 1 % (ref 0–5)
HCT: 21.2 % — ABNORMAL LOW (ref 39.0–52.0)
Hemoglobin: 6.8 g/dL — CL (ref 13.0–17.0)
Lymphocytes Relative: 13 % (ref 12–46)
Lymphs Abs: 1.3 10*3/uL (ref 0.7–4.0)
MCH: 25.6 pg — ABNORMAL LOW (ref 26.0–34.0)
MCV: 79.7 fL (ref 78.0–100.0)
Monocytes Absolute: 0.9 10*3/uL (ref 0.1–1.0)
Monocytes Relative: 9 % (ref 3–12)
Platelets: 323 10*3/uL (ref 150–400)
RBC: 2.66 MIL/uL — ABNORMAL LOW (ref 4.22–5.81)
WBC: 9.7 10*3/uL (ref 4.0–10.5)

## 2013-01-30 LAB — RETICULOCYTES
Retic Count, Absolute: 16.4 10*3/uL — ABNORMAL LOW (ref 19.0–186.0)
Retic Ct Pct: 0.6 % (ref 0.4–3.1)

## 2013-01-30 LAB — URINE MICROSCOPIC-ADD ON

## 2013-01-30 LAB — LIPASE, BLOOD: Lipase: 6 U/L — ABNORMAL LOW (ref 11–59)

## 2013-01-30 LAB — OCCULT BLOOD, POC DEVICE: Fecal Occult Bld: NEGATIVE

## 2013-01-30 LAB — CG4 I-STAT (LACTIC ACID): Lactic Acid, Venous: 0.57 mmol/L (ref 0.5–2.2)

## 2013-01-30 MED ORDER — SODIUM CHLORIDE 0.9 % IV BOLUS (SEPSIS)
1000.0000 mL | Freq: Once | INTRAVENOUS | Status: AC
  Administered 2013-01-30: 1000 mL via INTRAVENOUS

## 2013-01-30 MED ORDER — MORPHINE SULFATE 4 MG/ML IJ SOLN
4.0000 mg | Freq: Once | INTRAMUSCULAR | Status: AC
Start: 2013-01-30 — End: 2013-01-30
  Administered 2013-01-30: 4 mg via INTRAVENOUS
  Filled 2013-01-30: qty 1

## 2013-01-30 MED ORDER — ALTEPLASE 2 MG IJ SOLR
2.0000 mg | Freq: Once | INTRAMUSCULAR | Status: AC
  Administered 2013-01-30: 2 mg
  Filled 2013-01-30: qty 2

## 2013-01-30 MED ORDER — MEROPENEM 1 G IV SOLR
1.0000 g | Freq: Three times a day (TID) | INTRAVENOUS | Status: DC
  Administered 2013-01-30 – 2013-02-02 (×9): 1 g via INTRAVENOUS
  Filled 2013-01-30 (×13): qty 1

## 2013-01-30 MED ORDER — MORPHINE SULFATE 4 MG/ML IJ SOLN
6.0000 mg | Freq: Once | INTRAMUSCULAR | Status: AC
  Administered 2013-01-30: 6 mg via INTRAVENOUS
  Filled 2013-01-30: qty 2

## 2013-01-30 MED ORDER — VANCOMYCIN HCL IN DEXTROSE 750-5 MG/150ML-% IV SOLN
750.0000 mg | Freq: Two times a day (BID) | INTRAVENOUS | Status: DC
  Administered 2013-01-31 – 2013-02-01 (×2): 750 mg via INTRAVENOUS
  Filled 2013-01-30 (×6): qty 150

## 2013-01-30 NOTE — ED Notes (Signed)
Lab notified patient has add on blood work.

## 2013-01-30 NOTE — ED Notes (Signed)
Patient eating hot dinner plate at this time.

## 2013-01-30 NOTE — ED Notes (Signed)
Patient cleaned and changed, patient able to assist.
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
## 2013-01-30 NOTE — ED Notes (Signed)
Iv team at bedside attempting to declot PICC

## 2013-01-30 NOTE — ED Notes (Signed)
Patient cleaned out colostomy bag and did an in and out cath on self. Patient requesting more pain medication at this time. Home health nurse in room with patient. Will continue to monitor.

## 2013-01-30 NOTE — ED Notes (Signed)
Critical care paged [phone]

## 2013-01-30 NOTE — ED Notes (Signed)
Patient sitting on stretcher, eating at this time. Patient asking about plan of care, discussed with patient. Went to hang fluid on patient and IV was infiltrated. Attempted to start another IV and unsuccessful. Lab in room at this time to obtain blood cultures. Patient also requesting pain medication. Will continue to monitor patient. IV team paged again to attempt another IV.
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
## 2013-01-30 NOTE — ED Notes (Signed)
Unable to flush or draw blood from PICC line, IV team called.
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
## 2013-01-31 ENCOUNTER — Encounter (HOSPITAL_COMMUNITY): Payer: Self-pay | Admitting: Internal Medicine

## 2013-01-31 DIAGNOSIS — L89109 Pressure ulcer of unspecified part of back, unspecified stage: Secondary | ICD-10-CM

## 2013-01-31 DIAGNOSIS — I5022 Chronic systolic (congestive) heart failure: Secondary | ICD-10-CM

## 2013-01-31 DIAGNOSIS — N179 Acute kidney failure, unspecified: Secondary | ICD-10-CM

## 2013-01-31 DIAGNOSIS — M869 Osteomyelitis, unspecified: Secondary | ICD-10-CM

## 2013-01-31 DIAGNOSIS — I509 Heart failure, unspecified: Secondary | ICD-10-CM

## 2013-01-31 DIAGNOSIS — D649 Anemia, unspecified: Secondary | ICD-10-CM

## 2013-01-31 LAB — CBC
MCH: 26.7 pg (ref 26.0–34.0)
MCHC: 32.8 g/dL (ref 30.0–36.0)
MCV: 81.3 fL (ref 78.0–100.0)
Platelets: 293 10*3/uL (ref 150–400)
RDW: 16 % — ABNORMAL HIGH (ref 11.5–15.5)

## 2013-01-31 LAB — BASIC METABOLIC PANEL
BUN: 14 mg/dL (ref 6–23)
CO2: 25 mEq/L (ref 19–32)
Calcium: 7.9 mg/dL — ABNORMAL LOW (ref 8.4–10.5)
Creatinine, Ser: 1.37 mg/dL — ABNORMAL HIGH (ref 0.50–1.35)
GFR calc non Af Amer: 64 mL/min — ABNORMAL LOW (ref >90)
Glucose, Bld: 112 mg/dL — ABNORMAL HIGH (ref 70–99)
Sodium: 135 mEq/L (ref 135–145)

## 2013-01-31 LAB — URINE CULTURE
Colony Count: NO GROWTH
Culture: NO GROWTH

## 2013-01-31 LAB — IRON AND TIBC: UIBC: 98 ug/dL — ABNORMAL LOW (ref 125–400)

## 2013-01-31 LAB — MRSA PCR SCREENING: MRSA by PCR: NEGATIVE

## 2013-01-31 LAB — FERRITIN: Ferritin: 1058 ng/mL — ABNORMAL HIGH (ref 22–322)

## 2013-01-31 MED ORDER — ONDANSETRON HCL 4 MG/2ML IJ SOLN: 4.0000 mg | Freq: Four times a day (QID) | INTRAMUSCULAR | Status: DC | PRN

## 2013-01-31 MED ORDER — CARVEDILOL 3.125 MG PO TABS
3.1250 mg | ORAL_TABLET | Freq: Two times a day (BID) | ORAL | Status: DC
  Administered 2013-01-31: 3.125 mg via ORAL
  Filled 2013-01-31 (×3): qty 1

## 2013-01-31 MED ORDER — ENSURE PLUS PO LIQD
237.0000 mL | Freq: Two times a day (BID) | ORAL | Status: DC
  Filled 2013-01-31 (×6): qty 237

## 2013-01-31 MED ORDER — POTASSIUM CHLORIDE CRYS ER 20 MEQ PO TBCR
40.0000 meq | EXTENDED_RELEASE_TABLET | Freq: Once | ORAL | Status: DC
  Filled 2013-01-31: qty 2

## 2013-01-31 MED ORDER — SIMVASTATIN 40 MG PO TABS
40.0000 mg | ORAL_TABLET | Freq: Every evening | ORAL | Status: DC
  Administered 2013-01-31 – 2013-02-01 (×2): 40 mg via ORAL
  Filled 2013-01-31 (×3): qty 1

## 2013-01-31 MED ORDER — ACETAMINOPHEN 650 MG RE SUPP: 650.0000 mg | Freq: Four times a day (QID) | RECTAL | Status: DC | PRN

## 2013-01-31 MED ORDER — OXYBUTYNIN CHLORIDE 5 MG PO TABS
5.0000 mg | ORAL_TABLET | Freq: Three times a day (TID) | ORAL | Status: DC
  Administered 2013-01-31 – 2013-02-02 (×7): 5 mg via ORAL
  Filled 2013-01-31 (×10): qty 1

## 2013-01-31 MED ORDER — SODIUM CHLORIDE 0.9 % IV SOLN
INTRAVENOUS | Status: DC
  Administered 2013-01-31: 10 mL/h via INTRAVENOUS
  Administered 2013-02-01 – 2013-02-02 (×2): via INTRAVENOUS

## 2013-01-31 MED ORDER — MIRTAZAPINE 30 MG PO TABS
30.0000 mg | ORAL_TABLET | Freq: Every day | ORAL | Status: DC
  Administered 2013-01-31 – 2013-02-01 (×3): 30 mg via ORAL
  Filled 2013-01-31 (×4): qty 1

## 2013-01-31 MED ORDER — SODIUM CHLORIDE 0.9 % IJ SOLN
3.0000 mL | Freq: Two times a day (BID) | INTRAMUSCULAR | Status: DC
  Administered 2013-02-01: 3 mL via INTRAVENOUS

## 2013-01-31 MED ORDER — FERROUS SULFATE 325 (65 FE) MG PO TABS
325.0000 mg | ORAL_TABLET | Freq: Three times a day (TID) | ORAL | Status: DC
  Administered 2013-01-31 – 2013-02-02 (×6): 325 mg via ORAL
  Filled 2013-01-31 (×8): qty 1

## 2013-01-31 MED ORDER — OXYCODONE HCL 5 MG PO TABS
10.0000 mg | ORAL_TABLET | Freq: Once | ORAL | Status: AC
  Administered 2013-01-31: 10 mg via ORAL
  Filled 2013-01-31: qty 2

## 2013-01-31 MED ORDER — ONDANSETRON HCL 4 MG PO TABS: 4.0000 mg | ORAL_TABLET | Freq: Four times a day (QID) | ORAL | Status: DC | PRN

## 2013-01-31 MED ORDER — ACETAMINOPHEN 325 MG PO TABS: 650.0000 mg | ORAL_TABLET | Freq: Four times a day (QID) | ORAL | Status: DC | PRN

## 2013-01-31 MED ORDER — OXYCODONE HCL 5 MG PO TABS
10.0000 mg | ORAL_TABLET | ORAL | Status: DC | PRN
  Administered 2013-01-31 – 2013-02-01 (×6): 20 mg via ORAL
  Administered 2013-02-01 (×2): 10 mg via ORAL
  Administered 2013-02-01 – 2013-02-02 (×4): 20 mg via ORAL
  Filled 2013-01-31 (×2): qty 4
  Filled 2013-01-31 (×2): qty 2
  Filled 2013-01-31 (×8): qty 4

## 2013-01-31 NOTE — Progress Notes (Signed)
Utilization Review Completed. 01/31/2013
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
## 2013-01-31 NOTE — Progress Notes (Signed)
Unless there is overt external bleeding, the woudn is not the source for anemia  [name] [name]. [name] Bon, MD, FACS [phone] [facility] Surgery
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
## 2013-01-31 NOTE — ED Notes (Signed)
hospitalist in room  
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
## 2013-01-31 NOTE — Progress Notes (Signed)
[facility]  Patient Status: Active (receiving services up to time of hospitalization)  [facility] is providing the following services: RN and Home Infusion Services (teaching and education will be done by nurse in the home with patient and caregiver)  If patient discharges after hours, please call [phone].   [name] [name] 01/31/2013, 9:53 AM

## 2013-01-31 NOTE — ED Notes (Signed)
Critical care doctor down and states he feels patient does not need ICU bed, Dr. Herma Carson to speak with EDP about patient.

## 2013-01-31 NOTE — Progress Notes (Signed)
Blood transfusion complete, transfusion started in the ED, no s/s of a reaction noted, will continue to monitor pt. Adria Dill RN

## 2013-01-31 NOTE — ED Notes (Signed)
Patient given in and out cath kit to perform on self.

## 2013-01-31 NOTE — ED Notes (Signed)
Patient report called to Selena Batten for admission, nurse voiced understanding of report and had no further questions. Patient is in stable condition at this time for transport. Patient will be going to room via stretcher to room 2604. Belongings and wheelchair with patient.

## 2013-01-31 NOTE — ED Provider Notes (Signed)
  Medical screening examination/treatment/procedure(s) were performed by non-physician practitioner and as supervising physician I was immediately available for consultation/collaboration.  On my exam the patient was in no distress.  He describes feeling generally better than his last presentation here, though with the concerns listed by Dr. Beverely Pace  I saw the relevant labs and studies - I agree with the interpretation. Also reviewed the patient's prior admission records, and prior vital signs.  Following initial evaluation, labs demonstrating acute kidney injury, anemia, we discussed the case with critical care.  They evaluated the patient, deamed the patient is appropriate for noncritical care. bed   Gerhard Munch, MD 01/31/13 [phone]

## 2013-01-31 NOTE — Consult Note (Addendum)
WOC consult Note Reason for Consult: Consult requested for chronic sacrum/ischium wound and left foot wounds.  Pt familiar to WOC team from previous admission, refer to previous progress notes and [facility] previous admission notes. He has home health assistance from [facility] and has been evaluated in the past by Dr Kelly Splinter of the plastics team.  Wound type: Sacrum ischium wound stage 4, bone palpable with swab.14X12X3cm, tunneling from 12 o'clock to 3:00 o'clock to 5 cm, swab comes out bloody.  Unable to visualize in tract.  Large amt green drainage, when repositioned there is a continuous stream of clear fluid from unknown source.  Strong odor, visible wound bed fairly clean and beefy red. Continue present plan of care packing with gauze and covering with abd pads Q day to absorb drainage. Pressure Ulcer POA: Yes  Measurement: Left plantar heel with unstageable wound, 3X3cm 100% dry eschar Left toe with dry callous .5X.5cm without odor or drainage Left outer heel stage 3 wound 3X2X.2cm, 100% red, mod yellow drainage, no odor. It is best practice to leave dry stable eschar intact. No topical care needed for callous.  Pt states he uss collagen dressing to left outer heel, this topical treatment is not available in the [facility] formulary.  Will substitute a similar product of alginate dressing to absorb drainage and promote healing.  Pt has a colostomy which he denies any assistance with pouch change.  Stoma red and viable when visualized thought the pouch, intact with good seal.  Supplies ordered to bedside for pt and staff use.  Discussed plan of care with primary team at bedside.  Recommend [facility] consult and another CT scan or MRI to determine source of drainage and bleeding from area which tunnels. Float left heel and air mattress to reduce pressure.  Nutrition consult to optimize nutrition. Cammie Mcgee MSN, RN, CWOCN, [place], CNS [phone]

## 2013-01-31 NOTE — Progress Notes (Signed)
Dr. Gonzella Lex  called and aware of pts refusal to take po potassium, no new orders, Berle Mull RN

## 2013-01-31 NOTE — ED Notes (Signed)
Patient does have wound cover on buttocks and left heel. Dressings are clean dry and intact.

## 2013-02-01 ENCOUNTER — Inpatient Hospital Stay (HOSPITAL_COMMUNITY): Payer: Medicaid Other

## 2013-02-01 DIAGNOSIS — L89109 Pressure ulcer of unspecified part of back, unspecified stage: Secondary | ICD-10-CM

## 2013-02-01 DIAGNOSIS — L8994 Pressure ulcer of unspecified site, stage 4: Secondary | ICD-10-CM

## 2013-02-01 LAB — TYPE AND SCREEN
Antibody Screen: NEGATIVE
Unit division: 0

## 2013-02-01 LAB — CBC
HCT: 23.8 % — ABNORMAL LOW (ref 39.0–52.0)
MCHC: 31.9 g/dL (ref 30.0–36.0)
Platelets: 312 10*3/uL (ref 150–400)
RDW: 16.5 % — ABNORMAL HIGH (ref 11.5–15.5)
WBC: 8 10*3/uL (ref 4.0–10.5)

## 2013-02-01 LAB — BASIC METABOLIC PANEL
BUN: 13 mg/dL (ref 6–23)
Chloride: 105 mEq/L (ref 96–112)
GFR calc Af Amer: 71 mL/min — ABNORMAL LOW (ref >90)
GFR calc non Af Amer: 61 mL/min — ABNORMAL LOW (ref >90)
Potassium: 3.5 mEq/L (ref 3.5–5.1)
Sodium: 137 mEq/L (ref 135–145)

## 2013-02-01 LAB — VANCOMYCIN, TROUGH: Vancomycin Tr: 24.7 ug/mL — ABNORMAL HIGH (ref 10.0–20.0)

## 2013-02-01 MED ORDER — SODIUM CHLORIDE 0.9 % IJ SOLN
10.0000 mL | INTRAMUSCULAR | Status: DC | PRN
  Administered 2013-02-01 (×2): 10 mL
  Administered 2013-02-02: 20 mL
  Administered 2013-02-02: 10 mL

## 2013-02-01 MED ORDER — VANCOMYCIN HCL 500 MG IV SOLR
500.0000 mg | Freq: Two times a day (BID) | INTRAVENOUS | Status: DC
  Filled 2013-02-01: qty 500

## 2013-02-01 MED ORDER — VANCOMYCIN HCL 500 MG IV SOLR
500.0000 mg | Freq: Two times a day (BID) | INTRAVENOUS | Status: DC
  Administered 2013-02-01 – 2013-02-02 (×3): 500 mg via INTRAVENOUS
  Filled 2013-02-01 (×4): qty 500

## 2013-02-01 MED ORDER — ENSURE COMPLETE PO LIQD
237.0000 mL | Freq: Two times a day (BID) | ORAL | Status: DC
  Administered 2013-02-01: 237 mL via ORAL

## 2013-02-01 MED ORDER — GI COCKTAIL ~~LOC~~
30.0000 mL | Freq: Once | ORAL | Status: AC
  Administered 2013-02-01: 30 mL via ORAL
  Filled 2013-02-01: qty 30

## 2013-02-01 MED ORDER — IOHEXOL 300 MG/ML  SOLN
25.0000 mL | INTRAMUSCULAR | Status: AC
  Administered 2013-02-01 (×2): 25 mL via ORAL

## 2013-02-01 MED ORDER — MAGIC MOUTHWASH
5.0000 mL | Freq: Four times a day (QID) | ORAL | Status: DC | PRN
  Filled 2013-02-01: qty 5

## 2013-02-01 MED ORDER — PANTOPRAZOLE SODIUM 40 MG PO TBEC
40.0000 mg | DELAYED_RELEASE_TABLET | Freq: Every day | ORAL | Status: DC
  Administered 2013-02-01: 40 mg via ORAL
  Filled 2013-02-01: qty 1
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
## 2013-02-01 NOTE — Progress Notes (Signed)
Pt episode of 6 beat run of V-tach, no S/S, BP 96/58, HR 98, on call MD notified, will continue to monitor, Thanks, Lavonda Jumbo RN

## 2013-02-01 NOTE — Progress Notes (Signed)
ANTIBIOTIC CONSULT NOTE-PROGRESS NOTE  Pharmacy Consult for Vancomycin, Merrem Indication: Group G Strep bacteremia, polymicrobial sacral decubitus ulcer  Hospital Problems Principal Problem:   Anemia Active Problems:   Sacral decubitus ulcer, stage IV, s/p debridement & colostomy diversion   Paraplegia following T10spinal cord injury   Acute renal failure   Sacral osteomyelitis *  Vitals: BP 96/58  Pulse 98  Temp(Src) 97.4 F (36.3 C) (Oral)  Resp 20  Ht 6\' 2"  (1.88 m)  Wt 159 lb 9.6 oz (72.394 kg)  BMI 20.48 kg/m2  SpO2 96%  Labs:  Recent Labs  01/30/13 1712 01/31/13 0445 02/01/13 0422  WBC 9.7 9.7 8.0  HGB 6.8* 8.4* 7.6*  PLT 323 293 312  CREATININE 1.57* 1.37* 1.43*   Estimated Creatinine Clearance: 71.7 ml/min (by C-G formula based on Cr of 1.43).  Vancomycin Trough = 24.7 mcg/ml   Microbiology: Recent Results (from the past 720 hour(s))  CULTURE, BLOOD (ROUTINE X 2)     Status: None   Collection Time    01/09/13  4:45 PM      Result Value Range Status   Specimen Description BLOOD RIGHT FOREARM   Final   Special Requests BOTTLES DRAWN AEROBIC AND ANAEROBIC 10CC   Final   Culture  Setup Time 01/09/2013 23:39   Final   Culture     Final   Value: STREPTOCOCCUS GROUP G     Note: Gram Stain Report Called to,Read Back By and Verified With: PEACE DORKU ON 01/10/2013 AT 10:26P BY WILEJ   Report Status 01/12/2013 FINAL   Final   Organism ID, Bacteria STREPTOCOCCUS GROUP G   Final  CULTURE, BLOOD (ROUTINE X 2)     Status: None   Collection Time    01/09/13  5:30 PM      Result Value Range Status   Specimen Description BLOOD ARM LEFT   Final   Special Requests BOTTLES DRAWN AEROBIC AND ANAEROBIC 10CC   Final   Culture  Setup Time 01/09/2013 23:39   Final   Culture NO GROWTH 5 DAYS   Final   Report Status 01/15/2013 FINAL   Final  MRSA PCR SCREENING     Status: None   Collection Time    01/09/13  9:41 PM      Result Value Range Status   MRSA by PCR NEGATIVE   NEGATIVE Final   Comment:            The GeneXpert MRSA Assay (FDA     approved for NASAL specimens     only), is one component of a     comprehensive MRSA colonization     surveillance program. It is not     intended to diagnose MRSA     infection nor to guide or     monitor treatment for     MRSA infections.  GRAM STAIN     Status: None   Collection Time    01/10/13 12:46 AM      Result Value Range Status   Specimen Description ABSCESS RIGHT BUTTOCKS   Final   Special Requests Immunocompromised   Final   Gram Stain     Final   Value: FEW WBC PRESENT, PREDOMINANTLY PMN     RARE GRAM POSITIVE COCCI     Results Called toGinette Pitman [id number] [place]   Report Status 01/10/2013 FINAL   Final  URINE CULTURE     Status: None   Collection Time    01/10/13 12:46  AM      Result Value Range Status   Specimen Description URINE, CATHETERIZED   Final   Special Requests NONE   Final   Culture  Setup Time 01/10/2013 08:50   Final   Colony Count NO GROWTH   Final   Culture NO GROWTH   Final   Report Status 01/11/2013 FINAL   Final  CULTURE, ROUTINE-ABSCESS     Status: None   Collection Time    01/10/13 12:46 AM      Result Value Range Status   Specimen Description ABSCESS RIGHT BUTTOCKS   Final   Special Requests NONE   Final   Gram Stain     Final   Value: FEW WBC PRESENT, PREDOMINANTLY PMN     NO SQUAMOUS EPITHELIAL CELLS SEEN     RARE GRAM POSITIVE COCCI     IN PAIRS Gram Stain Report Called to,Read Back By and Verified With: Gram Stain Report Called to,Read Back By and Verified With: K REID RN 01/10/13 0300 BY [place] Performed at [facility]   Culture     Final   Value: FEW METHICILLIN RESISTANT STAPHYLOCOCCUS AUREUS     Note: RIFAMPIN AND GENTAMICIN SHOULD NOT BE USED AS SINGLE DRUGS FOR TREATMENT OF STAPH INFECTIONS. This organism DOES NOT demonstrate inducible Clindamycin resistance in vitro. CRITICAL RESULT CALLED TO, READ BACK BY AND VERIFIED WITH: TASHLYN @  10:45AM       01/13/13 BY DWEEKS     FEW STREPTOCOCCUS GROUP G     Note: CORRECTED RESULTS CALLED TO: TASHLYN @ 10:54AM  01/13/13 BY DWEEKS PREVIOUSLY REPORTED AS STREPTOCOCCUS GROUP C   Report Status 01/15/2013 FINAL   Final   Organism ID, Bacteria METHICILLIN RESISTANT STAPHYLOCOCCUS AUREUS   Final  ANAEROBIC CULTURE     Status: None   Collection Time    01/10/13  3:17 PM      Result Value Range Status   Specimen Description ABSCESS   Final   Special Requests SACRAL   Final   Gram Stain     Final   Value: RARE WBC PRESENT, PREDOMINANTLY PMN     NO SQUAMOUS EPITHELIAL CELLS SEEN     RARE GRAM POSITIVE COCCI     IN PAIRS   Culture NO ANAEROBES ISOLATED   Final   Report Status 01/15/2013 FINAL   Final  CULTURE, BLOOD (ROUTINE X 2)     Status: None   Collection Time    01/13/13  2:55 AM      Result Value Range Status   Specimen Description BLOOD LEFT ARM   Final   Special Requests BOTTLES DRAWN AEROBIC AND ANAEROBIC 10CC   Final   Culture  Setup Time 01/13/2013 12:43   Final   Culture NO GROWTH 5 DAYS   Final   Report Status 01/19/2013 FINAL   Final  CULTURE, BLOOD (ROUTINE X 2)     Status: None   Collection Time    01/13/13  3:05 AM      Result Value Range Status   Specimen Description BLOOD LEFT ARM   Final   Special Requests BOTTLES DRAWN AEROBIC ONLY 3CC   Final   Culture  Setup Time 01/13/2013 12:43   Final   Culture NO GROWTH 5 DAYS   Final   Report Status 01/19/2013 FINAL   Final  URINE CULTURE     Status: None   Collection Time    01/30/13  6:06 PM      Result  Value Range Status   Specimen Description URINE, RANDOM   Final   Special Requests NONE   Final   Culture  Setup Time 01/30/2013 21:27   Final   Colony Count NO GROWTH   Final   Culture NO GROWTH   Final   Report Status 01/31/2013 FINAL   Final  CULTURE, BLOOD (ROUTINE X 2)     Status: None   Collection Time    01/30/13  7:50 PM      Result Value Range Status   Specimen Description BLOOD ARM RIGHT   Final    Special Requests BOTTLES DRAWN AEROBIC AND ANAEROBIC 10CC   Final   Culture  Setup Time 01/31/2013 03:10   Final   Culture     Final   Value:        BLOOD CULTURE RECEIVED NO GROWTH TO DATE CULTURE WILL BE HELD FOR 5 DAYS BEFORE ISSUING A FINAL NEGATIVE REPORT   Report Status PENDING   Incomplete  CULTURE, BLOOD (ROUTINE X 2)     Status: None   Collection Time    01/30/13  8:45 PM      Result Value Range Status   Specimen Description BLOOD ARM RIGHT   Final   Special Requests BOTTLES DRAWN AEROBIC AND ANAEROBIC 10CC   Final   Culture  Setup Time 01/31/2013 03:10   Final   Culture     Final   Value:        BLOOD CULTURE RECEIVED NO GROWTH TO DATE CULTURE WILL BE HELD FOR 5 DAYS BEFORE ISSUING A FINAL NEGATIVE REPORT   Report Status PENDING   Incomplete  MRSA PCR SCREENING     Status: None   Collection Time    01/31/13  1:58 AM      Result Value Range Status   MRSA by PCR NEGATIVE  NEGATIVE Final    Anti-infectives Anti-infectives   Start     Dose/Rate Route Frequency Ordered Stop   01/30/13 2300  vancomycin (VANCOCIN) IVPB 750 mg/150 ml premix     750 mg 150 mL/hr over 60 Minutes Intravenous Every 12 hours 01/30/13 1801     01/30/13 1900  meropenem (MERREM) 1 g in sodium chloride 0.9 % 100 mL IVPB     1 g 200 mL/hr over 30 Minutes Intravenous Every 8 hours 01/30/13 1753       Assessment:  37 y/o male with T10 paraplegia from [place] who is on Vancomycin and Merrem for Group G Strep bacteremia and polymicrobial sacral decubitus ulcer  Vancomycin trough 24.7 mcg/ml on 750 mg IV q 12 hours.  Patient's Scr has risen significantly over last 3 weeks [ 0.42 > 1.43].  UOP currently 0.7 ml/kg/hr.  Goal of Therapy:   Vancomycin trough level 15-20 mcg/ml  Plan:   Reduce Vancomycin to 500 mg IV q 12 hours. Follow up SCr, UOP, cultures, clinical course and adjust as clinically indicated. Will follow-up Vancomycin trough level at steady state.  Laurena Bering, Pharm.D.  02/01/2013  12:28 PM

## 2013-02-02 DIAGNOSIS — D509 Iron deficiency anemia, unspecified: Secondary | ICD-10-CM | POA: Diagnosis present

## 2013-02-02 DIAGNOSIS — M869 Osteomyelitis, unspecified: Secondary | ICD-10-CM

## 2013-02-02 LAB — CBC
MCH: 26.6 pg (ref 26.0–34.0)
MCHC: 32.1 g/dL (ref 30.0–36.0)
MCV: 82.9 fL (ref 78.0–100.0)
Platelets: 329 10*3/uL (ref 150–400)
RDW: 17 % — ABNORMAL HIGH (ref 11.5–15.5)

## 2013-02-02 LAB — BASIC METABOLIC PANEL
BUN: 13 mg/dL (ref 6–23)
CO2: 24 mEq/L (ref 19–32)
Calcium: 8.2 mg/dL — ABNORMAL LOW (ref 8.4–10.5)
Creatinine, Ser: 1.38 mg/dL — ABNORMAL HIGH (ref 0.50–1.35)
GFR calc non Af Amer: 64 mL/min — ABNORMAL LOW (ref >90)
Glucose, Bld: 89 mg/dL (ref 70–99)

## 2013-02-02 MED ORDER — HEPARIN SOD (PORK) LOCK FLUSH 100 UNIT/ML IV SOLN
250.0000 [IU] | INTRAVENOUS | Status: AC | PRN
  Administered 2013-02-02: 14:00:00

## 2013-02-02 MED ORDER — MAGIC MOUTHWASH: 5.0000 mL | Freq: Four times a day (QID) | ORAL | Status: DC | PRN

## 2013-02-02 MED ORDER — FERROUS SULFATE 325 (65 FE) MG PO TABS: 325.0000 mg | ORAL_TABLET | Freq: Three times a day (TID) | ORAL | Status: DC

## 2013-02-02 MED ORDER — VANCOMYCIN HCL IN DEXTROSE 1-5 GM/200ML-% IV SOLN: 500.0000 mg | Freq: Two times a day (BID) | INTRAVENOUS | Status: DC

## 2013-02-02 MED ORDER — POTASSIUM CHLORIDE CRYS ER 20 MEQ PO TBCR
40.0000 meq | EXTENDED_RELEASE_TABLET | Freq: Once | ORAL | Status: DC
  Filled 2013-02-02: qty 2

## 2013-02-02 NOTE — Progress Notes (Signed)
1445 diischarge instructions and prescription  Given to pt and mother . Picc line flushed by IV team .. Wound care by pt with i n assistance  As per pt's preference. To lob by wheelchair with mother
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
## 2013-02-02 NOTE — Progress Notes (Signed)
1339 vancomycin 500 mgs ivpb . unablle to edit as not given

## 2013-02-02 NOTE — Progress Notes (Signed)
1000  Self catherization done under aseptic technique . Tolerated well  . Voided clear yellow urine

## 2013-02-06 LAB — CULTURE, BLOOD (ROUTINE X 2): Culture: NO GROWTH

## 2013-02-08 ENCOUNTER — Encounter: Payer: Self-pay | Admitting: Cardiovascular Disease

## 2013-02-08 ENCOUNTER — Ambulatory Visit (INDEPENDENT_AMBULATORY_CARE_PROVIDER_SITE_OTHER): Payer: Medicaid Other | Admitting: Cardiovascular Disease

## 2013-02-08 ENCOUNTER — Other Ambulatory Visit (INDEPENDENT_AMBULATORY_CARE_PROVIDER_SITE_OTHER): Payer: Medicaid Other

## 2013-02-08 VITALS — BP 90/60 | HR 96 | Ht 74.0 in | Wt 158.0 lb

## 2013-02-08 DIAGNOSIS — R0989 Other specified symptoms and signs involving the circulatory and respiratory systems: Secondary | ICD-10-CM

## 2013-02-08 DIAGNOSIS — I5022 Chronic systolic (congestive) heart failure: Secondary | ICD-10-CM

## 2013-02-08 DIAGNOSIS — I509 Heart failure, unspecified: Secondary | ICD-10-CM

## 2013-02-08 DIAGNOSIS — E876 Hypokalemia: Secondary | ICD-10-CM

## 2013-02-08 LAB — BASIC METABOLIC PANEL
CO2: 25 mEq/L (ref 19–32)
Calcium: 7.8 mg/dL — ABNORMAL LOW (ref 8.4–10.5)
Creatinine, Ser: 1 mg/dL (ref 0.4–1.5)
GFR: 107.23 mL/min (ref >60.00)

## 2013-02-08 NOTE — Patient Instructions (Addendum)
Your physician recommends that you return for lab work in: today//bmet  Your physician wants you to follow-up in: 6 months  You will receive a reminder letter in the mail two months in advance. If you don't receive a letter, please call our office to schedule the follow-up appointment.  Your physician recommends that you continue on your current medications as directed. Please refer to the Current Medication list given to you today.    
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
## 2013-02-08 NOTE — Assessment & Plan Note (Signed)
[name] [name] has a history of chronic systolic congestive heart failure. Unfortunately, that is not his main problem at this point. He said recurrent infections as wel hopefully we will be increase carvedilol to later time. I encouraged him to eat more and tol as anemia. He has chronic osteomyelitis. His appetite has been poor and he has lost a lot of weight since I last saw him.  At this point his blood pressure is low but his heart rate remains high. We will not be able to increase his dose of carvedilol because of his low blood pressure. Encouraged him to eat more to try to regain some weight.  I will see him in 6 months.

## 2013-02-19 ENCOUNTER — Encounter (HOSPITAL_BASED_OUTPATIENT_CLINIC_OR_DEPARTMENT_OTHER): Payer: Medicaid Other | Attending: Plastic Surgery

## 2013-02-19 DIAGNOSIS — L8994 Pressure ulcer of unspecified site, stage 4: Secondary | ICD-10-CM | POA: Insufficient documentation

## 2013-02-19 DIAGNOSIS — L89109 Pressure ulcer of unspecified part of back, unspecified stage: Secondary | ICD-10-CM | POA: Insufficient documentation

## 2013-02-20 NOTE — Progress Notes (Signed)
[facility]  NAME:  [name] [name], [name] [name] NO.:  [id number]  MEDICAL RECORD NO.:  [id number]      DATE OF BIRTH:  [birth year]  PHYSICIAN:  Wayland Denis, DO            VISIT DATE:                                  OFFICE VISIT   The patient is a 37 year old gentleman, who was recently discharged from the hospital with concerns of sepsis.  He was started on vancomycin.  He has shown improvement, however, the vanc has affected his appetite significantly and he is having a lot of trouble eating.  He is trying to do protein shakes but that is about all he is eating at this point.  He is otherwise without complaints.  He has lost likely around 20 pounds.  PHYSICAL EXAMINATION:  He is alert, oriented, cooperative, not in any acute distress.  The PICC lines in the right arm and he is getting the vancomycin.  The wounds are stable and clean but large.  He has 1 sacral flap scar that was done many years ago.  He would be a candidate for another flap if we can get his pre-albumin up.  We will check that in 3 weeks.  In the meantime, we will continue with the wet-to-dry and we will see him back in 3 weeks' time.     Wayland Denis, DO     CS/MEDQ  D:  02/19/2013  T:  02/20/2013  Job:  [id number]

## 2013-02-21 ENCOUNTER — Encounter: Payer: Self-pay | Admitting: Infectious Disease

## 2013-02-22 ENCOUNTER — Encounter: Payer: Self-pay | Admitting: Infectious Disease

## 2013-02-22 ENCOUNTER — Ambulatory Visit (INDEPENDENT_AMBULATORY_CARE_PROVIDER_SITE_OTHER): Payer: Medicaid Other | Admitting: Infectious Disease

## 2013-02-22 VITALS — BP 88/51 | HR 109 | Temp 98.3°F | Ht 74.0 in | Wt 158.0 lb

## 2013-02-22 DIAGNOSIS — M009 Pyogenic arthritis, unspecified: Secondary | ICD-10-CM

## 2013-02-22 DIAGNOSIS — A4901 Methicillin susceptible Staphylococcus aureus infection, unspecified site: Secondary | ICD-10-CM

## 2013-02-22 DIAGNOSIS — M869 Osteomyelitis, unspecified: Secondary | ICD-10-CM

## 2013-02-22 DIAGNOSIS — Z5189 Encounter for other specified aftercare: Secondary | ICD-10-CM

## 2013-02-22 DIAGNOSIS — R7881 Bacteremia: Secondary | ICD-10-CM

## 2013-02-22 DIAGNOSIS — T827XXD Infection and inflammatory reaction due to other cardiac and vascular devices, implants and grafts, subsequent encounter: Secondary | ICD-10-CM

## 2013-02-22 DIAGNOSIS — F5000 Anorexia nervosa, unspecified: Secondary | ICD-10-CM

## 2013-02-22 DIAGNOSIS — R630 Anorexia: Secondary | ICD-10-CM

## 2013-02-22 DIAGNOSIS — L8994 Pressure ulcer of unspecified site, stage 4: Secondary | ICD-10-CM

## 2013-02-22 DIAGNOSIS — L8990 Pressure ulcer of unspecified site, unspecified stage: Secondary | ICD-10-CM

## 2013-02-22 MED ORDER — MEGESTROL ACETATE 800 MG/20ML PO SUSP: 800.0000 mg | Freq: Every day | ORAL | Status: DC

## 2013-02-22 NOTE — Progress Notes (Signed)
[facility]    Subjective: Co of nausea, anorexia. Says he saw Dr. Kelly Splinter but his nutrition is not optimal and no surgical intevention with plastics to be pursued. He claims wound looks worse but he did not want me to examine it today not wanting packing to come out. He denies fevers, chills or malaise   Medications: Vancomycin and meropenem   Objective: Weight change: 0 lb (0 kg)  Intake/Output Summary (Last 24 hours) at 01/12/13 1624 Last data filed at 01/12/13 0700  Gross per 24 hour  Intake 1752.08 ml  Output   1800 ml  Net -47.92 ml   Blood pressure 88/51, pulse 109, temperature 98.3 F (36.8 C), temperature source Oral, height 6\' 2"  (1.88 m), weight 158 lb (71.668 kg). Temp:  [98.6 F (37 C)-99.1 F (37.3 C)] 98.6 F (37 C) (04/04 0401) Pulse Rate:  [98-112] 106 (04/04 0401) Resp:  [16-27] 17 (04/04 0401) BP: (90-111)/(42-55) 111/55 mmHg (04/04 0401) SpO2:  [95 %-99 %] 95 % (04/04 0401) Weight:  [154 lb 5.2 oz (70 kg)] 154 lb 5.2 oz (70 kg) (04/04 0401)  Physical Exam: General: Alert and awake, oriented x3, not in any acute distress.  HEENT: anicteric sclera,, EOMI, oropharynx clear and without exudate  CVS regular rate, normal r, no murmur rubs or gallops  Chest: clear to auscultation bilaterally, no wheezing, rales or rhonchi  Abdomen: soft nontender, nondistended, normal bowel sounds,  Neuro: paraplegic  MSK: skin: He would not let me examine his decubitus ulcer his PICC line was clean both some blood he had dried in the dressing.    Lab Results: No results found for this basename: WBC, HGB, HCT, PLT,  in the last 72 hours  BMET No results found for this basename: NA, K, CL, CO2, GLUCOSE, BUN, CREATININE, CALCIUM,  in the last 72 hours  Micro Results: No results found for this or any previous visit (from the past 240 hour(s)).  Studies/Results: No results found.    Assessment/Plan: [name] [name] is a 37 y.o.  male with   is paraplegic with a neurogenic bladder history of methicillin sensitive staph aureus bacteremia and pacemaker infection status post extraction and prolonged IV antibiotic therapy, history of extended spectrum beta-lactamase colonization of urine history of a large sacral decubitus ulcer with chronic osteomyelitis admitted acutely with Sirs/sepsis picture  In early April, then readmitted for possible UTI and SIRS now > 6 weeks into broad spectrum abx course currently with Meropenem and vancomycin   #1large stage IV sacral decubitus ulcer with ischial osteomyelitis chronically and septic hip:  -He has finished 6 weeks of broad spectrum antibiotics. Though this certainly has not solved his problem. Right now his principal issue is in his mind his poor appetite and lack of nutrition.  I do not see a point and continued protracted IV antibiotics I will stop them for now to allow him to have improvement in his appetite.  I am skeptical that oral antibiotics are also to be terribly effective either. And for now would prefer to keep him off antibiotics while he tries to optimize his nutrition and intervening again if he worsens. He would do better in a skilled nursing facility but as described previously he had a bad experience with [name] before.  #2 Poor appetite: I rx megace for him   #3 Septic hip: refused aspiration of hip at last time  #4 S. aureus bacteremia with PM infection: sp removal and 6 weeks of  effective therapy    LOS: 3 days   Acey Lav 02/22/2013, 5:25 PM

## 2013-02-28 ENCOUNTER — Encounter: Payer: Self-pay | Admitting: Plastic Surgery

## 2013-02-28 ENCOUNTER — Encounter: Payer: Self-pay | Admitting: Infectious Disease

## 2013-03-12 ENCOUNTER — Encounter (HOSPITAL_BASED_OUTPATIENT_CLINIC_OR_DEPARTMENT_OTHER): Payer: Medicaid Other

## 2013-03-12 ENCOUNTER — Encounter: Payer: Self-pay | Admitting: Infectious Disease

## 2013-03-13 ENCOUNTER — Encounter (HOSPITAL_COMMUNITY): Payer: Self-pay

## 2013-03-13 ENCOUNTER — Inpatient Hospital Stay (HOSPITAL_COMMUNITY)
Admission: EM | Admit: 2013-03-13 | Discharge: 2013-03-17 | DRG: 871 | Payer: Medicaid Other | Attending: Internal Medicine | Admitting: Internal Medicine

## 2013-03-13 ENCOUNTER — Emergency Department (HOSPITAL_COMMUNITY): Payer: Medicaid Other

## 2013-03-13 DIAGNOSIS — Z681 Body mass index (BMI) 19 or less, adult: Secondary | ICD-10-CM

## 2013-03-13 DIAGNOSIS — K921 Melena: Secondary | ICD-10-CM

## 2013-03-13 DIAGNOSIS — Z87440 Personal history of urinary (tract) infections: Secondary | ICD-10-CM

## 2013-03-13 DIAGNOSIS — R000 Tachycardia, unspecified: Secondary | ICD-10-CM | POA: Diagnosis present

## 2013-03-13 DIAGNOSIS — D638 Anemia in other chronic diseases classified elsewhere: Secondary | ICD-10-CM | POA: Diagnosis present

## 2013-03-13 DIAGNOSIS — F1210 Cannabis abuse, uncomplicated: Secondary | ICD-10-CM | POA: Diagnosis present

## 2013-03-13 DIAGNOSIS — M869 Osteomyelitis, unspecified: Secondary | ICD-10-CM | POA: Diagnosis present

## 2013-03-13 DIAGNOSIS — K922 Gastrointestinal hemorrhage, unspecified: Secondary | ICD-10-CM | POA: Diagnosis present

## 2013-03-13 DIAGNOSIS — A419 Sepsis, unspecified organism: Principal | ICD-10-CM | POA: Diagnosis present

## 2013-03-13 DIAGNOSIS — L8993 Pressure ulcer of unspecified site, stage 3: Secondary | ICD-10-CM | POA: Diagnosis present

## 2013-03-13 DIAGNOSIS — L89109 Pressure ulcer of unspecified part of back, unspecified stage: Secondary | ICD-10-CM | POA: Diagnosis present

## 2013-03-13 DIAGNOSIS — G609 Hereditary and idiopathic neuropathy, unspecified: Secondary | ICD-10-CM | POA: Diagnosis present

## 2013-03-13 DIAGNOSIS — I509 Heart failure, unspecified: Secondary | ICD-10-CM | POA: Diagnosis present

## 2013-03-13 DIAGNOSIS — I5022 Chronic systolic (congestive) heart failure: Secondary | ICD-10-CM | POA: Diagnosis present

## 2013-03-13 DIAGNOSIS — B3781 Candidal esophagitis: Secondary | ICD-10-CM | POA: Diagnosis present

## 2013-03-13 DIAGNOSIS — Z933 Colostomy status: Secondary | ICD-10-CM

## 2013-03-13 DIAGNOSIS — M009 Pyogenic arthritis, unspecified: Secondary | ICD-10-CM | POA: Diagnosis present

## 2013-03-13 DIAGNOSIS — L89154 Pressure ulcer of sacral region, stage 4: Secondary | ICD-10-CM

## 2013-03-13 DIAGNOSIS — F17200 Nicotine dependence, unspecified, uncomplicated: Secondary | ICD-10-CM | POA: Diagnosis present

## 2013-03-13 DIAGNOSIS — Z95810 Presence of automatic (implantable) cardiac defibrillator: Secondary | ICD-10-CM

## 2013-03-13 DIAGNOSIS — L89609 Pressure ulcer of unspecified heel, unspecified stage: Secondary | ICD-10-CM | POA: Diagnosis present

## 2013-03-13 DIAGNOSIS — L8995 Pressure ulcer of unspecified site, unstageable: Secondary | ICD-10-CM | POA: Diagnosis present

## 2013-03-13 DIAGNOSIS — E43 Unspecified severe protein-calorie malnutrition: Secondary | ICD-10-CM | POA: Diagnosis present

## 2013-03-13 DIAGNOSIS — I959 Hypotension, unspecified: Secondary | ICD-10-CM

## 2013-03-13 DIAGNOSIS — L8994 Pressure ulcer of unspecified site, stage 4: Secondary | ICD-10-CM

## 2013-03-13 DIAGNOSIS — G8220 Paraplegia, unspecified: Secondary | ICD-10-CM | POA: Diagnosis present

## 2013-03-13 DIAGNOSIS — D649 Anemia, unspecified: Secondary | ICD-10-CM

## 2013-03-13 LAB — URINALYSIS, ROUTINE W REFLEX MICROSCOPIC
Bilirubin Urine: NEGATIVE
Nitrite: POSITIVE — AB
Specific Gravity, Urine: 1.015 (ref 1.005–1.030)
Urobilinogen, UA: 0.2 mg/dL (ref 0.0–1.0)

## 2013-03-13 LAB — CBC WITH DIFFERENTIAL/PLATELET
Eosinophils Relative: 0 % (ref 0–5)
HCT: 16.9 % — ABNORMAL LOW (ref 39.0–52.0)
Lymphocytes Relative: 16 % (ref 12–46)
Lymphs Abs: 1.4 10*3/uL (ref 0.7–4.0)
MCV: 84.1 fL (ref 78.0–100.0)
Monocytes Absolute: 0.7 10*3/uL (ref 0.1–1.0)
Monocytes Relative: 8 % (ref 3–12)
RBC: 2.01 MIL/uL — ABNORMAL LOW (ref 4.22–5.81)
WBC: 9.2 10*3/uL (ref 4.0–10.5)

## 2013-03-13 LAB — COMPREHENSIVE METABOLIC PANEL
ALT: 5 U/L (ref 0–53)
CO2: 22 mEq/L (ref 19–32)
Calcium: 7.7 mg/dL — ABNORMAL LOW (ref 8.4–10.5)
Creatinine, Ser: 0.49 mg/dL — ABNORMAL LOW (ref 0.50–1.35)
GFR calc Af Amer: >90 mL/min (ref >90)
GFR calc non Af Amer: >90 mL/min (ref >90)
Glucose, Bld: 104 mg/dL — ABNORMAL HIGH (ref 70–99)

## 2013-03-13 LAB — PREPARE RBC (CROSSMATCH)

## 2013-03-13 LAB — POCT I-STAT, CHEM 8
Hemoglobin: 5.4 g/dL — CL (ref 13.0–17.0)
Sodium: 133 mEq/L — ABNORMAL LOW (ref 135–145)
TCO2: 24 mmol/L (ref 0–100)

## 2013-03-13 LAB — POCT I-STAT TROPONIN I: Troponin i, poc: 0.03 ng/mL (ref 0.00–0.08)

## 2013-03-13 LAB — CG4 I-STAT (LACTIC ACID): Lactic Acid, Venous: 0.7 mmol/L (ref 0.5–2.2)

## 2013-03-13 LAB — PROCALCITONIN: Procalcitonin: 38.2 ng/mL

## 2013-03-13 LAB — URINE MICROSCOPIC-ADD ON

## 2013-03-13 MED ORDER — MORPHINE SULFATE 2 MG/ML IJ SOLN
1.0000 mg | INTRAMUSCULAR | Status: DC | PRN
  Administered 2013-03-14 (×3): 1 mg via INTRAVENOUS
  Filled 2013-03-13 (×3): qty 1

## 2013-03-13 MED ORDER — ACETAMINOPHEN 650 MG RE SUPP: 650.0000 mg | Freq: Four times a day (QID) | RECTAL | Status: DC | PRN

## 2013-03-13 MED ORDER — SODIUM CHLORIDE 0.9 % IV SOLN
80.0000 mg | Freq: Once | INTRAVENOUS | Status: AC
  Administered 2013-03-13: 80 mg via INTRAVENOUS
  Filled 2013-03-13: qty 80

## 2013-03-13 MED ORDER — VANCOMYCIN HCL 500 MG IV SOLR
500.0000 mg | Freq: Two times a day (BID) | INTRAVENOUS | Status: DC
  Administered 2013-03-14 – 2013-03-17 (×7): 500 mg via INTRAVENOUS
  Filled 2013-03-13 (×10): qty 500

## 2013-03-13 MED ORDER — FENTANYL CITRATE 0.05 MG/ML IJ SOLN
INTRAMUSCULAR | Status: AC
  Filled 2013-03-13: qty 4

## 2013-03-13 MED ORDER — FENTANYL CITRATE 0.05 MG/ML IJ SOLN
100.0000 ug | Freq: Once | INTRAMUSCULAR | Status: AC
  Administered 2013-03-13: 100 ug via INTRAVENOUS
  Filled 2013-03-13: qty 2

## 2013-03-13 MED ORDER — SODIUM CHLORIDE 0.9 % IV BOLUS (SEPSIS): 1000.0000 mL | Freq: Once | INTRAVENOUS | Status: DC

## 2013-03-13 MED ORDER — ACETAMINOPHEN 325 MG PO TABS: 650.0000 mg | ORAL_TABLET | Freq: Four times a day (QID) | ORAL | Status: DC | PRN

## 2013-03-13 MED ORDER — VANCOMYCIN HCL IN DEXTROSE 1-5 GM/200ML-% IV SOLN: 1000.0000 mg | Freq: Three times a day (TID) | INTRAVENOUS | Status: DC

## 2013-03-13 MED ORDER — ONDANSETRON HCL 4 MG/2ML IJ SOLN
4.0000 mg | Freq: Once | INTRAMUSCULAR | Status: AC
  Administered 2013-03-13: 4 mg via INTRAVENOUS
  Filled 2013-03-13 (×2): qty 2

## 2013-03-13 MED ORDER — PIPERACILLIN-TAZOBACTAM 3.375 G IVPB
3.3750 g | Freq: Three times a day (TID) | INTRAVENOUS | Status: DC
  Administered 2013-03-13: 3.375 g via INTRAVENOUS
  Filled 2013-03-13: qty 50

## 2013-03-13 MED ORDER — ONDANSETRON HCL 4 MG PO TABS: 4.0000 mg | ORAL_TABLET | Freq: Four times a day (QID) | ORAL | Status: DC | PRN

## 2013-03-13 MED ORDER — SODIUM CHLORIDE 0.9 % IJ SOLN
3.0000 mL | Freq: Two times a day (BID) | INTRAMUSCULAR | Status: DC
  Administered 2013-03-14 – 2013-03-17 (×7): 3 mL via INTRAVENOUS

## 2013-03-13 MED ORDER — SODIUM CHLORIDE 0.9 % IV SOLN
8.0000 mg/h | INTRAVENOUS | Status: DC
  Administered 2013-03-14 – 2013-03-17 (×6): 8 mg/h via INTRAVENOUS
  Filled 2013-03-13 (×16): qty 80

## 2013-03-13 MED ORDER — SODIUM CHLORIDE 0.9 % IV SOLN
INTRAVENOUS | Status: DC
  Administered 2013-03-14 (×2): via INTRAVENOUS

## 2013-03-13 MED ORDER — VANCOMYCIN HCL IN DEXTROSE 1-5 GM/200ML-% IV SOLN
1000.0000 mg | Freq: Once | INTRAVENOUS | Status: AC
  Administered 2013-03-13: 1000 mg via INTRAVENOUS
  Filled 2013-03-13: qty 200

## 2013-03-13 MED ORDER — SODIUM CHLORIDE 0.9 % IV SOLN
1.0000 g | Freq: Three times a day (TID) | INTRAVENOUS | Status: DC
  Administered 2013-03-14 – 2013-03-17 (×10): 1 g via INTRAVENOUS
  Filled 2013-03-13 (×16): qty 1

## 2013-03-13 MED ORDER — SODIUM CHLORIDE 0.9 % IV BOLUS (SEPSIS)
500.0000 mL | Freq: Once | INTRAVENOUS | Status: AC
  Administered 2013-03-13: 500 mL via INTRAVENOUS

## 2013-03-13 MED ORDER — SODIUM CHLORIDE 0.9 % IV BOLUS (SEPSIS)
1000.0000 mL | Freq: Once | INTRAVENOUS | Status: DC
Start: 2013-03-13 — End: 2013-03-13

## 2013-03-13 MED ORDER — FENTANYL CITRATE 0.05 MG/ML IJ SOLN
150.0000 ug | Freq: Once | INTRAMUSCULAR | Status: AC
  Administered 2013-03-13: 150 ug via INTRAVENOUS

## 2013-03-13 MED ORDER — ONDANSETRON HCL 4 MG/2ML IJ SOLN: 4.0000 mg | Freq: Four times a day (QID) | INTRAMUSCULAR | Status: DC | PRN

## 2013-03-13 MED ORDER — PIPERACILLIN-TAZOBACTAM 3.375 G IVPB 30 MIN
3.3750 g | Freq: Once | INTRAVENOUS | Status: AC
  Administered 2013-03-13: 3.375 g via INTRAVENOUS
  Filled 2013-03-13: qty 50

## 2013-03-13 MED ORDER — OXYCODONE HCL 5 MG PO TABS
10.0000 mg | ORAL_TABLET | ORAL | Status: DC | PRN
  Administered 2013-03-14 – 2013-03-17 (×15): 20 mg via ORAL
  Filled 2013-03-13 (×15): qty 4

## 2013-03-13 NOTE — ED Notes (Signed)
EDP attempt EJ IV, unsuccessful. Will attempt central line insertion.
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
## 2013-03-13 NOTE — ED Notes (Signed)
Released PRBCs but unable to scan in [facility].   RBC Infusion #1 Blood Bank # [id number] Unit # [id number] ABO/Rh 0-Pos Exp Date 03/29/2013 Verified by Lurline Idol, RN

## 2013-03-13 NOTE — ED Notes (Signed)
Infusion started @ 50 mL/hr for 1st 15 minutes.
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
## 2013-03-13 NOTE — ED Notes (Signed)
Critical lab results was reported to Doctor.

## 2013-03-13 NOTE — ED Notes (Signed)
EMS IV not working any longer. Will restart IV. Pt uncooperative.

## 2013-03-13 NOTE — ED Notes (Signed)
Antibiotics started, phlelbotomy at bedside trying to obtain blood cultures. Pt is a difficult stick.

## 2013-03-13 NOTE — ED Notes (Addendum)
PER EMS: pt states he was not feeling well, pt is a paraplegic. Pt not wanting to share details of illness to EMS personnel. VS: BP-94/50. HR-116, CBG-90.

## 2013-03-13 NOTE — ED Notes (Signed)
The patient is negative for a blood transfusion reaction.

## 2013-03-13 NOTE — ED Notes (Signed)
2nd blood culture obtained from central line. 1st was obtained by phlebotomy prior.
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
## 2013-03-13 NOTE — ED Notes (Signed)
Resident at bedside beginning central line placement. Consent obtained.

## 2013-03-14 DIAGNOSIS — I5022 Chronic systolic (congestive) heart failure: Secondary | ICD-10-CM

## 2013-03-14 DIAGNOSIS — I509 Heart failure, unspecified: Secondary | ICD-10-CM

## 2013-03-14 LAB — GLUCOSE, CAPILLARY
Glucose-Capillary: 80 mg/dL (ref 70–99)
Glucose-Capillary: 92 mg/dL (ref 70–99)
Glucose-Capillary: 119 mg/dL — ABNORMAL HIGH (ref 70–99)

## 2013-03-14 LAB — CBC WITH DIFFERENTIAL/PLATELET
Basophils Absolute: 0 10*3/uL (ref 0.0–0.1)
Eosinophils Absolute: 0.1 10*3/uL (ref 0.0–0.7)
Lymphocytes Relative: 19 % (ref 12–46)
Lymphs Abs: 1.7 10*3/uL (ref 0.7–4.0)
Neutrophils Relative %: 71 % (ref 43–77)
Platelets: 497 10*3/uL — ABNORMAL HIGH (ref 150–400)
RBC: 2.67 MIL/uL — ABNORMAL LOW (ref 4.22–5.81)
RDW: 16.6 % — ABNORMAL HIGH (ref 11.5–15.5)
WBC: 8.6 10*3/uL (ref 4.0–10.5)

## 2013-03-14 LAB — COMPREHENSIVE METABOLIC PANEL
ALT: <5 U/L (ref 0–53)
AST: 5 U/L (ref 0–37)
Alkaline Phosphatase: 149 U/L — ABNORMAL HIGH (ref 39–117)
CO2: 23 mEq/L (ref 19–32)
Calcium: 7.9 mg/dL — ABNORMAL LOW (ref 8.4–10.5)
GFR calc Af Amer: >90 mL/min (ref >90)
Glucose, Bld: 80 mg/dL (ref 70–99)
Potassium: 3.3 mEq/L — ABNORMAL LOW (ref 3.5–5.1)
Sodium: 134 mEq/L — ABNORMAL LOW (ref 135–145)
Total Protein: 6.1 g/dL (ref 6.0–8.3)

## 2013-03-14 LAB — CBC
HCT: 22.8 % — ABNORMAL LOW (ref 39.0–52.0)
Hemoglobin: 7.3 g/dL — ABNORMAL LOW (ref 13.0–17.0)
MCH: 26.6 pg (ref 26.0–34.0)
MCHC: 32 g/dL (ref 30.0–36.0)

## 2013-03-14 MED ORDER — SODIUM CHLORIDE 0.9 % IV SOLN
INTRAVENOUS | Status: DC
  Administered 2013-03-14 – 2013-03-17 (×5): via INTRAVENOUS

## 2013-03-14 MED ORDER — HYDROMORPHONE HCL PF 1 MG/ML IJ SOLN
1.0000 mg | INTRAMUSCULAR | Status: DC | PRN
  Administered 2013-03-14 – 2013-03-15 (×8): 1 mg via INTRAVENOUS
  Filled 2013-03-14 (×9): qty 1
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
## 2013-03-14 NOTE — Progress Notes (Addendum)
Encouraged pt to strictly adhere to full liquid diet. Pt states, "I haven't ate in two days and I need a [place] sandwich". Nurse further reinforced the need to adhere to diet. Pt will need further reinforcement and monitoring.
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
## 2013-03-14 NOTE — Consult Note (Signed)
WOC consult Note Reason for Consult: Pt familiar to WOC from multiple previous visits.  Had bleeding and osteomyelitis from sacrum/ischium wound during last admission. Pt is followed as outpatient by Dr Kelly Splinter of plastics team. 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  Bone palpable Wound bed: 20% yellow, 80% red Drainage (amount, consistency, odor): Large amount of blood tinged drainage noted to old dressing, no foul odor present Periwound: Intact Dressing procedure/placement/frequency: Wound packed with gauze, covered with ABD pad and secured with tape to be changed daily.  Will order air mattress to assist with pressure redistribution and preventing further pressure.  Wound type: Unstageable to left heel Pressure Ulcer POA: Yes Measurement: 4cm X 3.5cm Wound bed: 100% dry, hard black skin Drainage (amount, consistency, odor): No drainage, no odor Periwound: Intact.  Scar tissue to posterior heel, 100% pink, 2.5cm X 1cm X 0.2 Dressing procedure/placement/frequency: It is best practice to leave black eschar tissue in place. Foam dressing placed to protect from further pressure, change every three days.   Wound type:  Unstageable to right lateral heel Pressure Ulcer POA: Yes Measurement: 1cm X 0.5cm X 0.3cm Wound bed: 90% yellow, 10% red Drainage (amount, consistency, odor): Small amount of yellow drainage Periwound: Intact Dressing procedure/placement/frequency: Calcium alginate to wound bed, secure with tape. To be changed two times a week, (Wednesday and Saturday) due Saturday.  Colostomy in place to LLQ, greenish, dark stool noted in bag, Bag intact, supplies in room, patient states he will change pouch himself. Norva Karvonen RN, MSN Student Please consider consult to ortho service or surgery if wound continues to have bloody drainage.  Consider ID team involvement since they were previously following for  osteomyelitis. Cammie Mcgee MSN, RN, CWOCN, [place], CNS [phone]

## 2013-03-14 NOTE — Progress Notes (Signed)
Pt refuses to get his sacral wound changed and measured. Pt dressing that's in place reinforced. Will continue to monitor pt. Adria Dill RN
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
## 2013-03-14 NOTE — ED Provider Notes (Addendum)
I saw and evaluated the patient, reviewed the resident's note and I agree with the findings and plan. If applicable, I agree with the resident's interpretation of the EKG.  If applicable, I was present for critical portions of any procedures performed.  Paraplegic with fatigue and black stool from ostomy x 2 days.  HR 120s, BP 80s-90s.  Denies fever. Noncooperative with history taking.  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 Also known septic arthritis of hips.  Code sepsis called. IVF, antibiotics started, cultures obtained. Cautious hydration with low EF. Likely sepsis from UTI/ulcer with GI bleed. protonix gtt and packed RBCs. D/w critical care who feel patient is appropriate for hospitalist service on stepdown.  Date: 03/14/2013  Rate: 118  Rhythm: sinus tachycardia  QRS Axis: normal  Intervals: normal  ST/T Wave abnormalities: nonspecific ST/T changes  Conduction Disutrbances:none  Narrative Interpretation:   Old EKG Reviewed: unchanged  CRITICAL CARE Performed by: Glynn Octave Total critical care time: 30 Critical care time was exclusive of separately billable procedures and treating other patients. Critical care was necessary to treat or prevent imminent or life-threatening deterioration. Critical care was time spent personally by me on the following activities: development of treatment plan with patient and/or surrogate as well as nursing, discussions with consultants, evaluation of patient's response to treatment, examination of patient, obtaining history from patient or surrogate, ordering and performing treatments and interventions, ordering and review of laboratory studies, ordering and review of radiographic studies, pulse oximetry and re-evaluation of patient's condition.   Glynn Octave, MD 03/14/13 1230  Glynn Octave, MD 03/14/13 [phone]

## 2013-03-15 ENCOUNTER — Ambulatory Visit: Payer: Medicaid Other | Admitting: Physical Therapy

## 2013-03-15 ENCOUNTER — Encounter (HOSPITAL_COMMUNITY): Payer: Self-pay

## 2013-03-15 ENCOUNTER — Inpatient Hospital Stay (HOSPITAL_COMMUNITY): Payer: Medicaid Other

## 2013-03-15 ENCOUNTER — Inpatient Hospital Stay (HOSPITAL_COMMUNITY): Payer: Medicaid Other | Admitting: Anesthesiology

## 2013-03-15 ENCOUNTER — Encounter (HOSPITAL_COMMUNITY): Payer: Self-pay | Admitting: Anesthesiology

## 2013-03-15 ENCOUNTER — Encounter (HOSPITAL_COMMUNITY)
Admission: EM | Disposition: A | Discharge status: Other Acute Inpatient Hospital | Payer: Self-pay | Source: Home / Self Care | Attending: Internal Medicine

## 2013-03-15 DIAGNOSIS — D649 Anemia, unspecified: Secondary | ICD-10-CM

## 2013-03-15 DIAGNOSIS — I5022 Chronic systolic (congestive) heart failure: Secondary | ICD-10-CM

## 2013-03-15 DIAGNOSIS — K922 Gastrointestinal hemorrhage, unspecified: Secondary | ICD-10-CM

## 2013-03-15 DIAGNOSIS — G8220 Paraplegia, unspecified: Secondary | ICD-10-CM

## 2013-03-15 DIAGNOSIS — E43 Unspecified severe protein-calorie malnutrition: Secondary | ICD-10-CM | POA: Diagnosis present

## 2013-03-15 DIAGNOSIS — A419 Sepsis, unspecified organism: Secondary | ICD-10-CM

## 2013-03-15 HISTORY — PX: ESOPHAGOGASTRODUODENOSCOPY: SHX5428

## 2013-03-15 LAB — CBC
HCT: 28.1 % — ABNORMAL LOW (ref 39.0–52.0)
Hemoglobin: 9 g/dL — ABNORMAL LOW (ref 13.0–17.0)
MCH: 26.5 pg (ref 26.0–34.0)
MCH: 26.5 pg (ref 26.0–34.0)
MCHC: 31.8 g/dL (ref 30.0–36.0)
MCHC: 32 g/dL (ref 30.0–36.0)
MCV: 83.3 fL (ref 78.0–100.0)
Platelets: 425 10*3/uL — ABNORMAL HIGH (ref 150–400)
RDW: 17.4 % — ABNORMAL HIGH (ref 11.5–15.5)

## 2013-03-15 LAB — BASIC METABOLIC PANEL
BUN: 7 mg/dL (ref 6–23)
Chloride: 108 mEq/L (ref 96–112)
GFR calc Af Amer: >90 mL/min (ref >90)
Glucose, Bld: 90 mg/dL (ref 70–99)
Potassium: 3.3 mEq/L — ABNORMAL LOW (ref 3.5–5.1)

## 2013-03-15 LAB — GLUCOSE, CAPILLARY: Glucose-Capillary: 71 mg/dL (ref 70–99)

## 2013-03-15 SURGERY — EGD (ESOPHAGOGASTRODUODENOSCOPY)
Anesthesia: Monitor Anesthesia Care | Laterality: Left

## 2013-03-15 MED ORDER — LACTATED RINGERS IV SOLN
INTRAVENOUS | Status: DC | PRN
  Administered 2013-03-15: 14:00:00 via INTRAVENOUS

## 2013-03-15 MED ORDER — HYDROMORPHONE HCL PF 1 MG/ML IJ SOLN
1.0000 mg | INTRAMUSCULAR | Status: DC | PRN
  Administered 2013-03-15 (×3): 2 mg via INTRAVENOUS
  Administered 2013-03-15: 1 mg via INTRAVENOUS
  Administered 2013-03-16 (×2): 2 mg via INTRAVENOUS
  Administered 2013-03-16: 1 mg via INTRAVENOUS
  Administered 2013-03-16 (×3): 2 mg via INTRAVENOUS
  Administered 2013-03-17 (×5): 1 mg via INTRAVENOUS
  Filled 2013-03-15 (×2): qty 2
  Filled 2013-03-15: qty 1
  Filled 2013-03-15: qty 2
  Filled 2013-03-15 (×2): qty 1
  Filled 2013-03-15: qty 2
  Filled 2013-03-15: qty 1
  Filled 2013-03-15: qty 2
  Filled 2013-03-15: qty 1
  Filled 2013-03-15 (×2): qty 2

## 2013-03-15 MED ORDER — FLUCONAZOLE 100 MG PO TABS
100.0000 mg | ORAL_TABLET | Freq: Every day | ORAL | Status: DC
  Administered 2013-03-16 – 2013-03-17 (×2): 100 mg via ORAL
  Filled 2013-03-15 (×4): qty 1

## 2013-03-15 MED ORDER — ENSURE COMPLETE PO LIQD
237.0000 mL | Freq: Three times a day (TID) | ORAL | Status: DC
  Administered 2013-03-16 – 2013-03-17 (×6): 237 mL via ORAL
  Filled 2013-03-15 (×5): qty 237

## 2013-03-15 MED ORDER — POTASSIUM CHLORIDE 10 MEQ/100ML IV SOLN
10.0000 meq | INTRAVENOUS | Status: AC
  Administered 2013-03-15 (×4): 10 meq via INTRAVENOUS
  Filled 2013-03-15 (×2): qty 200

## 2013-03-15 MED ORDER — BUTAMBEN-TETRACAINE-BENZOCAINE 2-2-14 % EX AERO
INHALATION_SPRAY | CUTANEOUS | Status: DC | PRN
  Administered 2013-03-15: 2 via TOPICAL

## 2013-03-15 MED ORDER — PROPOFOL INFUSION 10 MG/ML OPTIME
INTRAVENOUS | Status: DC | PRN
  Administered 2013-03-15: 180 ug/kg/min via INTRAVENOUS

## 2013-03-15 MED ORDER — HYDROMORPHONE HCL PF 1 MG/ML IJ SOLN
1.0000 mg | Freq: Once | INTRAMUSCULAR | Status: DC
  Filled 2013-03-15 (×4): qty 1

## 2013-03-15 MED ORDER — MIDAZOLAM HCL 5 MG/5ML IJ SOLN
INTRAMUSCULAR | Status: DC | PRN
  Administered 2013-03-15: 2 mg via INTRAVENOUS

## 2013-03-15 MED ORDER — KETAMINE HCL 10 MG/ML IJ SOLN
INTRAMUSCULAR | Status: DC | PRN
  Administered 2013-03-15: 20 mg via INTRAVENOUS

## 2013-03-15 MED ORDER — FLUCONAZOLE 200 MG PO TABS
200.0000 mg | ORAL_TABLET | Freq: Once | ORAL | Status: AC
  Administered 2013-03-15: 200 mg via ORAL
  Filled 2013-03-15 (×2): qty 1

## 2013-03-15 NOTE — Anesthesia Postprocedure Evaluation (Signed)
  Anesthesia Post-op Note  Patient: [name] [name]  Procedure(s) Performed: Procedure(s) (LRB): ESOPHAGOGASTRODUODENOSCOPY (EGD) (Left)  Patient Location: PACU  Anesthesia Type: MAC  Level of Consciousness: awake and alert   Airway and Oxygen Therapy: Patient Spontanous Breathing  Post-op Pain: mild  Post-op Assessment: Post-op Vital signs reviewed, Patient's Cardiovascular Status Stable, Respiratory Function Stable, Patent Airway and No signs of Nausea or vomiting  Last Vitals:  Filed Vitals:   03/15/13 1450  BP: 96/48  Pulse: 105  Temp:   Resp: 16    Post-op Vital Signs: stable   Complications: No apparent anesthesia complications

## 2013-03-15 NOTE — Transfer of Care (Signed)
Immediate Anesthesia Transfer of Care Note  Patient: [name] [name]  Procedure(s) Performed: Procedure(s): ESOPHAGOGASTRODUODENOSCOPY (EGD) (Left)  Patient Location: PACU and Endoscopy Unit  Anesthesia Type:MAC  Level of Consciousness: awake and alert   Airway & Oxygen Therapy: Patient Spontanous Breathing and Patient connected to nasal cannula oxygen  Post-op Assessment: Report given to PACU RN and Post -op Vital signs reviewed and stable  Post vital signs: Reviewed and stable  Complications: No apparent anesthesia complications

## 2013-03-15 NOTE — Progress Notes (Signed)
CRITICAL VALUE ALERT  Critical value received:  Hemoglobin 6.2     Date of notification:  03/15/13   Time of notification:  0302  Critical value read back:yes  Nurse who received alert:  Ferd Hibbs   MD notified (1st page):  Donnamarie Poag, NP   Time of first page:  0304  MD notified (2nd page):  Time of second page:  Responding MD:  Donnamarie Poag, NP  Time MD responded:  [phone]

## 2013-03-15 NOTE — Plan of Care (Signed)
Problem: Phase I Progression Outcomes Goal: Voiding-avoid urinary catheter unless indicated Outcome: Progressing Pt does in/out cath q4hrs.   Goal: Hemodynamically stable Outcome: Progressing HOTN- Dr. Eather Colas (pt normally runs hypotensive)
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
## 2013-03-15 NOTE — Interval H&P Note (Signed)
History and Physical Interval Note:  03/15/2013 1:56 PM  [name] [name]  has presented today for surgery, with the diagnosis of anemia, black stool via colostomy  The various methods of treatment have been discussed with the patient and family. After consideration of risks, benefits and other options for treatment, the patient has consented to  Procedure(s): ESOPHAGOGASTRODUODENOSCOPY (EGD) (Left) as a surgical intervention .  The patient's history has been reviewed, patient examined, no change in status, stable for surgery.  I have reviewed the patient's chart and labs.  Questions were answered to the patient's satisfaction.     [name] [name] M  Assessment:  1.  Anemia.  Suspect mostly anemia chronic disease and oozing from decubitus ulcer site. 2.  Dark stool through colostomy.  Suspect medication (peptobismol, iron) related.  Can't rule out peptic ulcer, etc.  Plan:  1.  Endoscopy. 2.  Risks (bleeding, infection, bowel perforation that could require surgery, sedation-related changes in cardiopulmonary systems), benefits (identification and possible treatment of source of symptoms, exclusion of certain causes of symptoms), and alternatives (watchful waiting, radiographic imaging studies, empiric medical treatment) of upper endoscopy (EGD) were explained to patient in detail and patient wishes to proceed.
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
## 2013-03-15 NOTE — Progress Notes (Signed)
Patient transferred to [facility] via critical care transport for EGD procedure.  VS stable prior to transfer.  Dr. Jomarie Longs ordered 1mg  Dilaudid to be administered prior to transport in addition to already given PRN dose.

## 2013-03-16 ENCOUNTER — Inpatient Hospital Stay (HOSPITAL_COMMUNITY): Payer: Medicaid Other

## 2013-03-16 ENCOUNTER — Encounter (HOSPITAL_COMMUNITY): Payer: Self-pay | Admitting: Radiology

## 2013-03-16 DIAGNOSIS — K922 Gastrointestinal hemorrhage, unspecified: Secondary | ICD-10-CM

## 2013-03-16 DIAGNOSIS — D649 Anemia, unspecified: Secondary | ICD-10-CM

## 2013-03-16 DIAGNOSIS — G8220 Paraplegia, unspecified: Secondary | ICD-10-CM

## 2013-03-16 DIAGNOSIS — A419 Sepsis, unspecified organism: Secondary | ICD-10-CM

## 2013-03-16 DIAGNOSIS — M869 Osteomyelitis, unspecified: Secondary | ICD-10-CM

## 2013-03-16 LAB — BASIC METABOLIC PANEL
BUN: 6 mg/dL (ref 6–23)
CO2: 20 mEq/L (ref 19–32)
Chloride: 109 mEq/L (ref 96–112)
Creatinine, Ser: 0.55 mg/dL (ref 0.50–1.35)
Potassium: 3.7 mEq/L (ref 3.5–5.1)

## 2013-03-16 LAB — GLUCOSE, CAPILLARY
Glucose-Capillary: 121 mg/dL — ABNORMAL HIGH (ref 70–99)
Glucose-Capillary: 116 mg/dL — ABNORMAL HIGH (ref 70–99)

## 2013-03-16 LAB — CBC
HCT: 27.3 % — ABNORMAL LOW (ref 39.0–52.0)
Hemoglobin: 8.6 g/dL — ABNORMAL LOW (ref 13.0–17.0)
MCHC: 31.5 g/dL (ref 30.0–36.0)
MCV: 84.5 fL (ref 78.0–100.0)
RDW: 17.8 % — ABNORMAL HIGH (ref 11.5–15.5)

## 2013-03-16 LAB — TYPE AND SCREEN
ABO/RH(D): O POS
Unit division: 0

## 2013-03-16 MED ORDER — IOHEXOL 300 MG/ML  SOLN
100.0000 mL | Freq: Once | INTRAMUSCULAR | Status: AC | PRN
  Administered 2013-03-16: 100 mL via INTRAVENOUS

## 2013-03-16 MED ORDER — PRO-STAT SUGAR FREE PO LIQD
30.0000 mL | Freq: Three times a day (TID) | ORAL | Status: DC
  Administered 2013-03-16 – 2013-03-17 (×4): 30 mL via ORAL
  Filled 2013-03-16 (×5): qty 30

## 2013-03-16 NOTE — Consult Note (Addendum)
WOC follow-up: According to MRI performed on 6/6, pt has osteomyelitis and abscess areas to his wound. This is beyond [facility] scope of practice.  Please consult ortho or CCS service for further plan of care.  Thank-you, Nejla Reasor Canova, CWOCN
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
## 2013-03-16 NOTE — Op Note (Signed)
[facility] [address] [place], [address]   ENDOSCOPY PROCEDURE REPORT  PATIENT: [name] [name], [name] [name]  MR#: [id number] BIRTHDATE: [birth year] , 36  yrs. old GENDER: Male ENDOSCOPIST: Willis Modena, MD REFERRED BY:  [facility] PROCEDURE DATE:  03/15/2013 PROCEDURE:  EGD, diagnostic ASA CLASS:     Class IV INDICATIONS:  anemia, dark stool via colostomy. MEDICATIONS: MAC sedation, administered by CRNA TOPICAL ANESTHETIC: Cetacaine Spray  DESCRIPTION OF PROCEDURE: After the risks benefits and alternatives of the procedure were thoroughly explained, informed consent was obtained.  The Pentax Gastroscope [id number] endoscope was introduced through the mouth and advanced to the second portion of the duodenum. Without limitations.  The instrument was slowly withdrawn as the mucosa was fully examined.    Findings:  Multiple white plaques throughout the esophagus, not readily lavaged free; appearance highly consistent with candida esophagitis.  Remainder of exam normal to the second portion of the duodenum.  No old or fresh blood seen to the extent of our examination.  No ulcer, mass, AVM, or other pathology seen.    Retroflexion into cardia normal.         The scope was then withdrawn from the patient and the procedure completed.  ENDOSCOPIC IMPRESSION:     As above.  Candida esophagitis.  No source of upper GI tract bleeding.  Suspect bleeding and anemia is from sacral decubitus site.  Suspect dark ostomy stool is from oral iron and bismuth subsalicylate.  RECOMMENDATIONS:     1.  Watch for potential complications of procedure. 2.  Diflucan 200 mg once today, then 100 mg per day to complete 10-day course of treatment. 3.  Start diet today. 4.  No further GI tract evaluation needed at this time.  Will sign-off; please call with questions.  eSigned:  Willis Modena, MD 03/15/2013 2:33 PM   CC:
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
## 2013-03-17 DIAGNOSIS — M869 Osteomyelitis, unspecified: Secondary | ICD-10-CM | POA: Diagnosis present

## 2013-03-17 DIAGNOSIS — IMO0002 Reserved for concepts with insufficient information to code with codable children: Secondary | ICD-10-CM

## 2013-03-17 DIAGNOSIS — M009 Pyogenic arthritis, unspecified: Secondary | ICD-10-CM | POA: Diagnosis present

## 2013-03-17 DIAGNOSIS — G8220 Paraplegia, unspecified: Secondary | ICD-10-CM

## 2013-03-17 LAB — CBC
Hemoglobin: 8.4 g/dL — ABNORMAL LOW (ref 13.0–17.0)
MCH: 26.5 pg (ref 26.0–34.0)
MCV: 83.6 fL (ref 78.0–100.0)
Platelets: 466 10*3/uL — ABNORMAL HIGH (ref 150–400)
RBC: 3.17 MIL/uL — ABNORMAL LOW (ref 4.22–5.81)
WBC: 10.5 10*3/uL (ref 4.0–10.5)

## 2013-03-17 LAB — PREALBUMIN: Prealbumin: 3.8 mg/dL — ABNORMAL LOW (ref 17.0–34.0)

## 2013-03-17 LAB — BASIC METABOLIC PANEL
CO2: 20 mEq/L (ref 19–32)
Calcium: 7.6 mg/dL — ABNORMAL LOW (ref 8.4–10.5)
Chloride: 108 mEq/L (ref 96–112)
Glucose, Bld: 114 mg/dL — ABNORMAL HIGH (ref 70–99)
Sodium: 136 mEq/L (ref 135–145)

## 2013-03-17 LAB — GLUCOSE, CAPILLARY: Glucose-Capillary: 98 mg/dL (ref 70–99)

## 2013-03-17 MED ORDER — PANTOPRAZOLE SODIUM 40 MG PO TBEC: 40.0000 mg | DELAYED_RELEASE_TABLET | Freq: Every day | ORAL | Status: DC

## 2013-03-17 MED ORDER — FLUCONAZOLE 100 MG PO TABS: 100.0000 mg | ORAL_TABLET | Freq: Every day | ORAL | Status: DC

## 2013-03-17 MED ORDER — PANTOPRAZOLE SODIUM 40 MG PO TBEC
40.0000 mg | DELAYED_RELEASE_TABLET | Freq: Every day | ORAL | Status: DC
  Administered 2013-03-17: 40 mg via ORAL
  Filled 2013-03-17: qty 1

## 2013-03-17 MED ORDER — VANCOMYCIN HCL 500 MG IV SOLR: 500.0000 mg | Freq: Two times a day (BID) | INTRAVENOUS | Status: DC

## 2013-03-17 MED ORDER — HYDROMORPHONE HCL PF 1 MG/ML IJ SOLN: 1.0000 mg | INTRAMUSCULAR | Status: DC | PRN

## 2013-03-17 MED ORDER — SODIUM CHLORIDE 0.9 % IV SOLN: 1.0000 g | Freq: Three times a day (TID) | INTRAVENOUS | Status: DC

## 2013-03-17 NOTE — Progress Notes (Signed)
2 Days Post-Op  Subjective: Sacral/ischial ulcers  Objective: Vital signs in last 24 hours: Temp:  [97.6 F (36.4 C)-98.6 F (37 C)] 97.6 F (36.4 C) (06/07 0850) Pulse Rate:  [97-118] 118 (06/07 0850) Resp:  [9-20] 17 (06/07 0850) BP: (98-112)/(56-76) 101/59 mmHg (06/07 0850) SpO2:  [100 %] 100 % (06/07 0850) Last BM Date: 03/16/13 (osomy)  Intake/Output from previous day: 06/06 0701 - 06/07 0700 In: 3520 [P.O.:720; I.V.:2300; IV Piggyback:500] Out: 1075 [Urine:1075] Intake/Output this shift: Total I/O In: 400 [I.V.:300; IV Piggyback:100] Out: 275 [Urine:275]  General appearance: cooperative, fatigued, no distress, slowed mentation and discouraged. Incision/Wound:  The wounds are not significantly different from a few weeks ago in the office. He seems to understand the below plan but is discouraged and scarred.  Lab Results:   Recent Labs  03/16/13 0500 03/17/13 0500  WBC 10.6* 10.5  HGB 8.6* 8.4*  HCT 27.3* 26.5*  PLT 431* 466*   BMET  Recent Labs  03/16/13 0500 03/17/13 0500  NA 136 136  K 3.7 3.7  CL 109 108  CO2 20 20  GLUCOSE 120* 114*  BUN 6 6  CREATININE 0.55 0.71  CALCIUM 7.7* 7.6*   PT/INR No results found for this basename: LABPROT, INR,  in the last 72 hours ABG No results found for this basename: PHART, PCO2, PO2, HCO3,  in the last 72 hours  Studies/Results: Ct Pelvis W Contrast  03/16/2013   *RADIOLOGY REPORT*  Clinical Data:  Sepsis.  Decubitus ulcer. Left hip pain and buttock pain.  CT PELVIS WITH CONTRAST  Technique:  Multidetector CT imaging of the pelvis was performed using the standard protocol following the bolus administration of intravenous contrast.  Contrast: OMNIPAQUE IOHEXOL 300 MG/ML  SOLN  Comparison:  CT scan dated 02/01/2013  Findings:  There is progressive osteomyelitis of the left femoral head and left acetabulum with increased bone destruction.  There is air and pus within the destroyed left hip joint.  There is a  large adjacent sacral decubitus ulcer which has increased in size.  There is a 6 x 2.4 x 7 cm  fluid collection posterior to the right ischial tuberosity which is better defined than on the prior study which probably represents an abscess as well.  The patient has increased subcutaneous edema diffusely consistent with anasarca.  Ostomy is noted in the left lower quadrant.  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  IMPRESSION:  1.  Increased osteomyelitis and bone destruction and pus and air within the left hip joint. Sacral decubitus ulcer has also increased in size.                                            2. Better defined abscess superficial to the right ischial tuberosity.   Original Report Authenticated By: Francene Boyers, M.D.    Anti-infectives: Anti-infectives   Start     Dose/Rate Route Frequency Ordered Stop   03/16/13 1000  fluconazole (DIFLUCAN) tablet 100 mg     100 mg Oral Daily 03/15/13 1440 03/25/13 0959   03/15/13 1600  fluconazole (DIFLUCAN) tablet 200 mg     200 mg Oral  Once 03/15/13 1440 03/15/13 1732   03/14/13 0800  vancomycin (VANCOCIN) 500 mg in sodium chloride 0.9 % 100 mL IVPB     500 mg 100 mL/hr over 60 Minutes Intravenous Every 12  hours 03/13/13 1801     03/14/13 0600  meropenem (MERREM) 1 g in sodium chloride 0.9 % 100 mL IVPB     1 g 200 mL/hr over 30 Minutes Intravenous 3 times per day 03/13/13 2224     03/14/13 0400  vancomycin (VANCOCIN) IVPB 1000 mg/200 mL premix  Status:  Discontinued     1,000 mg 200 mL/hr over 60 Minutes Intravenous Every 8 hours 03/13/13 1757 03/13/13 1801   03/13/13 1745  piperacillin-tazobactam (ZOSYN) IVPB 3.375 g     3.375 g 100 mL/hr over 30 Minutes Intravenous  Once 03/13/13 1742 03/13/13 1907   03/13/13 1745  vancomycin (VANCOCIN) IVPB 1000 mg/200 mL premix     1,000 mg 200 mL/hr over 60 Minutes Intravenous  Once 03/13/13 1742 03/13/13 2019   03/13/13 0000  piperacillin-tazobactam (ZOSYN) IVPB 3.375 g   Status:  Discontinued     3.375 g 12.5 mL/hr over 240 Minutes Intravenous 3 times per day 03/13/13 1757 03/13/13 2222      Assessment/Plan: s/p Procedure(s): ESOPHAGOGASTRODUODENOSCOPY (EGD) (Left) Recommend multidiciplinary approach for care.  The patient needs debridement and possible collaboration with ortho,gen and plastic. His nutrition is seriously low.  After debridement he would likely benefit from nursing facility care for 1-2 months for concentrated nutrition, turning and wound care.  Then when his prealbumin is in the 16 range he may be ready for a muscle flap for coverage.  He should be on an air mattress bed.  Needs Vit C 500 mg bid, Multivitamin, Zine 220 mg daily.  Nutrition consult for foods high in protein with protein shake suppliments 3 x day. Continue antibiotics.  Off load. Continue wet to dry dressings 2-3 x a day.  LOS: 4 days    [facility] 03/17/2013
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
## 2013-03-17 NOTE — Progress Notes (Signed)
1700: pt report called to Lupita Leash, Charity fundraiser at [facility]) in [place].    1759: pt left via stretcher with [facility].  D/c papers were signed, and explained to pt.  VSS.   Salomon Mast, RN

## 2013-03-19 ENCOUNTER — Encounter (HOSPITAL_COMMUNITY): Payer: Self-pay | Admitting: Gastroenterology

## 2013-03-20 LAB — CULTURE, BLOOD (ROUTINE X 2): Culture: NO GROWTH

## 2013-03-20 LAB — URINE CULTURE

## 2013-03-27 ENCOUNTER — Encounter: Payer: Self-pay | Admitting: Infectious Disease

## 2013-04-08 IMAGING — RF DG ESOPHAGUS
11 series · 11 of 11 positions shown · non-contrast
Comparison: No priors.

CLINICAL DATA: Dysphasia.  Sensation of something stuck in the
esophagus.  Evaluate for stricture or obstruction.

ESOPHOGRAM/BARIUM SWALLOW
TECHNIQUE: Single contrast examination was performed using thin
barium.
Fluoroscopy time:  0.59 minutes.

[Series 1: run · 1 of 1 slices shown (1 of 11)]
[im 1/1]
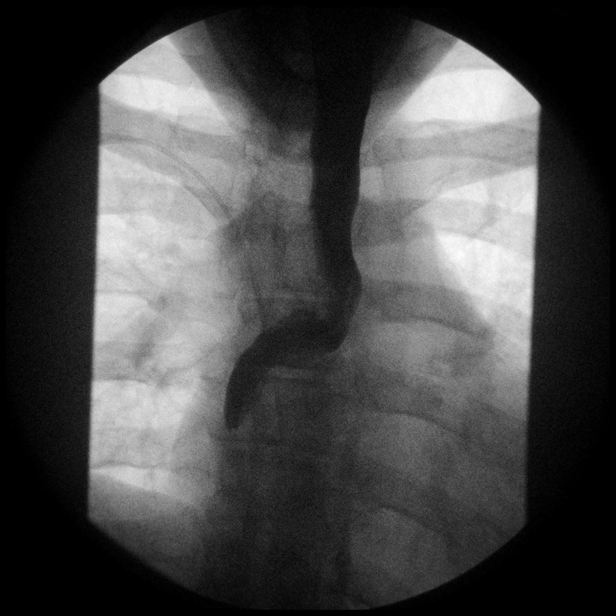

[Series 2: run · 1 of 1 slices shown (2 of 11)]
[im 1/1]
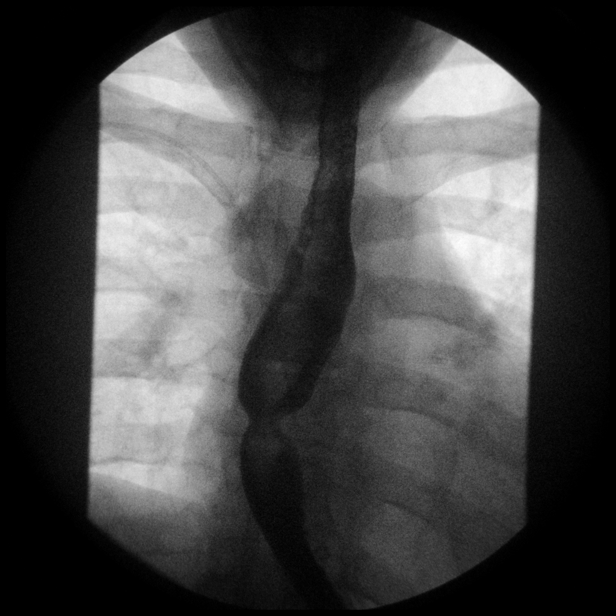

[Series 3: run · 1 of 1 slices shown (3 of 11)]
[im 1/1]
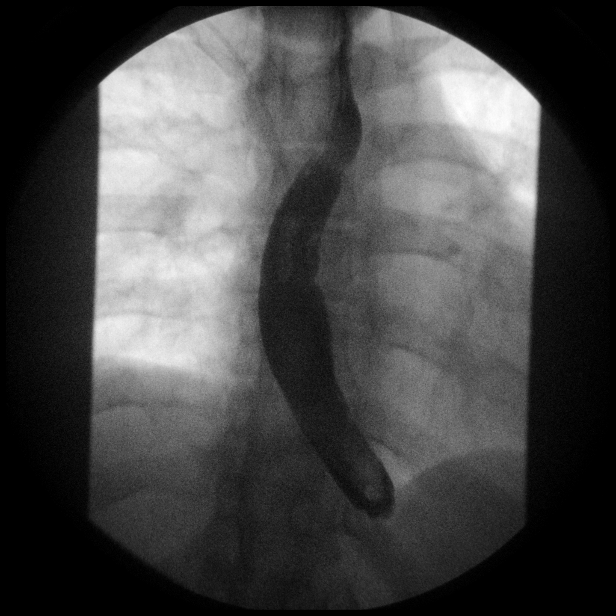

[Series 4: run · 1 of 1 slices shown (4 of 11)]
[im 1/1]
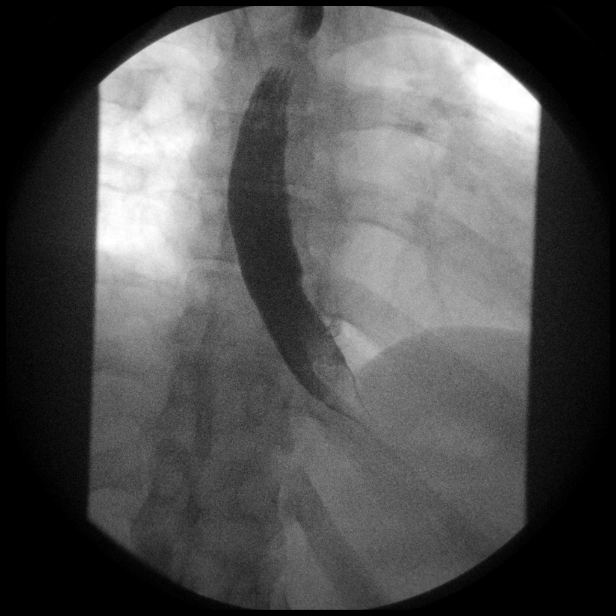

[Series 5: run · 1 of 1 slices shown (5 of 11)]
[im 1/1]
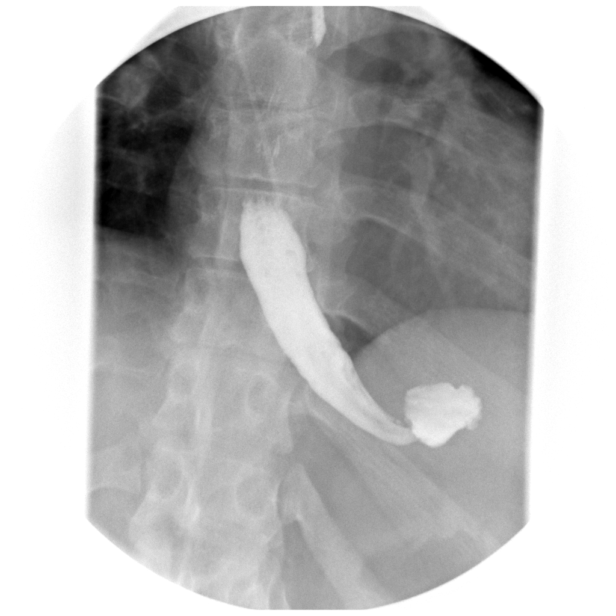

[Series 6: run · 1 of 1 slices shown (6 of 11)]
[im 1/1]
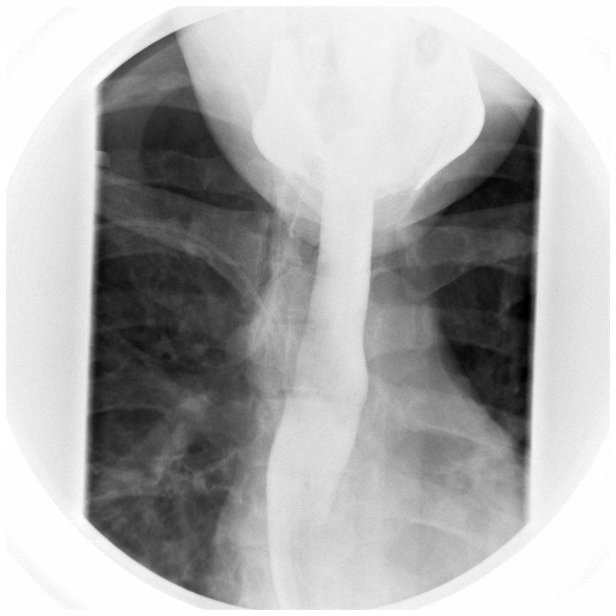

[Series 7: run · 1 of 1 slices shown (7 of 11)]
[im 1/1]
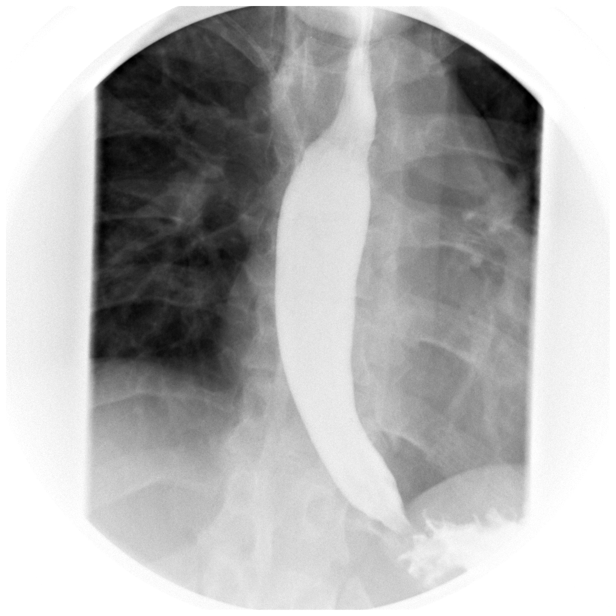

[Series 8: run · 1 of 1 slices shown (8 of 11)]
[im 1/1]
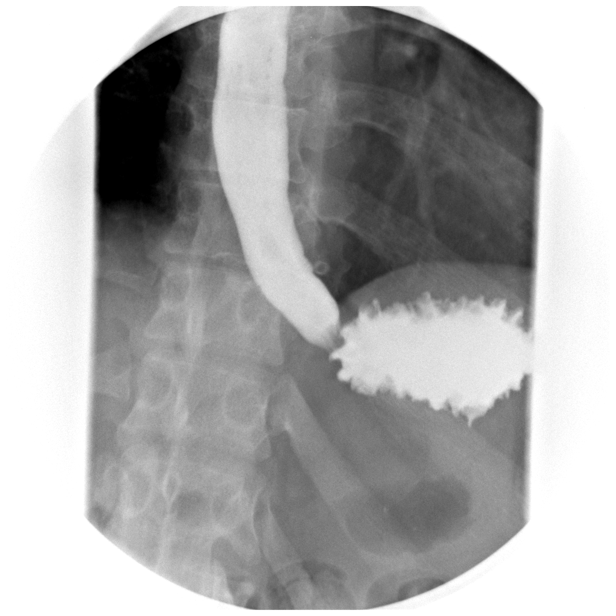

[Series 9: run · 1 of 1 slices shown (9 of 11)]
[im 1/1]
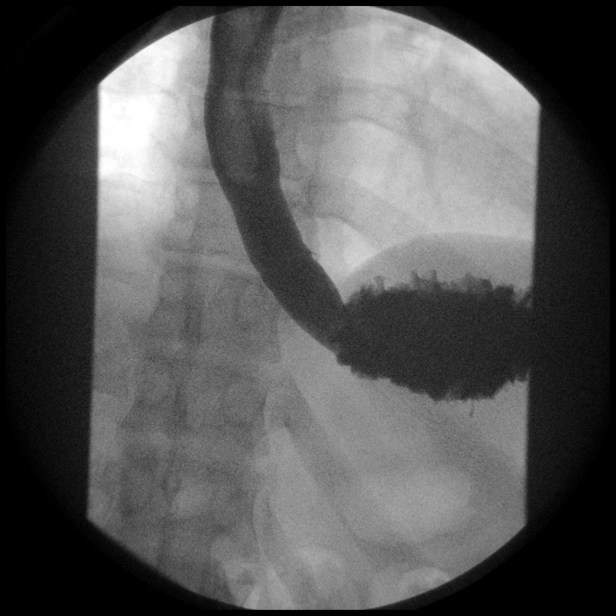

[Series 10: run · 1 of 1 slices shown (10 of 11)]
[im 1/1]
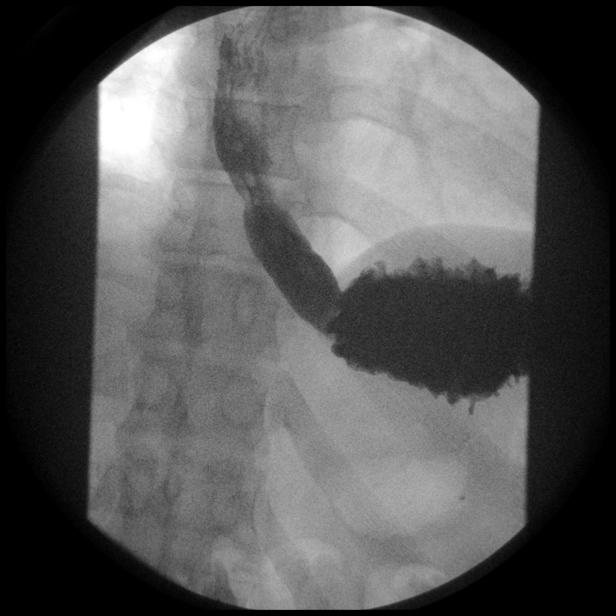

[Series 11: run · 1 of 1 slices shown (11 of 11)]
[im 1/1]
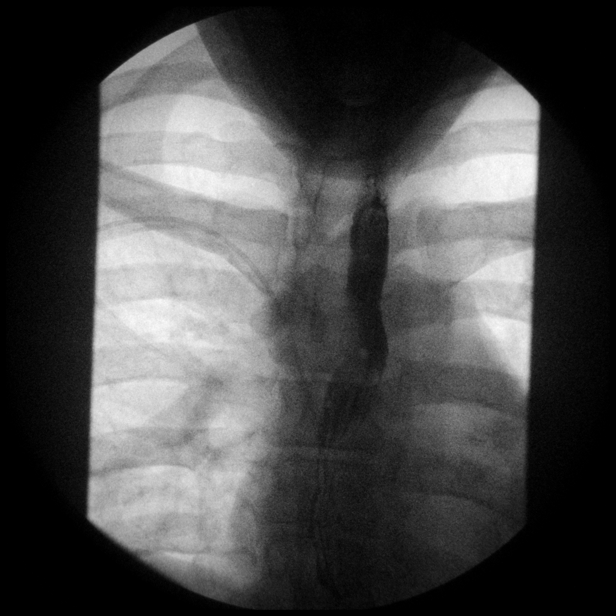

[11 of 11 positions shown; findings below may reference images not displayed]

FINDINGS: Limited esophagram was performed given the patient's
limited mobility.  Esophageal motility was normal.  The esophagus
readily emptied into the stomach.  No esophageal mass, stricture or
significant esophageal ring was noted.  No hiatal hernia.
IMPRESSION: 1.  No evidence of stricture or obstructing mass.  No findings to
account for the patient's symptoms.

## 2013-05-03 ENCOUNTER — Ambulatory Visit: Payer: Medicaid Other | Attending: Family Medicine | Admitting: Physical Therapy

## 2013-05-03 DIAGNOSIS — M6281 Muscle weakness (generalized): Secondary | ICD-10-CM | POA: Insufficient documentation

## 2013-05-03 DIAGNOSIS — IMO0001 Reserved for inherently not codable concepts without codable children: Secondary | ICD-10-CM | POA: Insufficient documentation

## 2013-07-14 ENCOUNTER — Encounter (HOSPITAL_COMMUNITY): Payer: Self-pay | Admitting: *Deleted

## 2013-07-14 ENCOUNTER — Emergency Department (HOSPITAL_COMMUNITY)
Admission: EM | Admit: 2013-07-14 | Discharge: 2013-07-14 | Disposition: A | Discharge status: Home / Self Care | Payer: Medicaid Other | Attending: Emergency Medicine | Admitting: Emergency Medicine

## 2013-07-14 DIAGNOSIS — Z8719 Personal history of other diseases of the digestive system: Secondary | ICD-10-CM | POA: Insufficient documentation

## 2013-07-14 DIAGNOSIS — Z79899 Other long term (current) drug therapy: Secondary | ICD-10-CM | POA: Insufficient documentation

## 2013-07-14 DIAGNOSIS — R509 Fever, unspecified: Secondary | ICD-10-CM | POA: Insufficient documentation

## 2013-07-14 DIAGNOSIS — L0390 Cellulitis, unspecified: Secondary | ICD-10-CM

## 2013-07-14 DIAGNOSIS — Z872 Personal history of diseases of the skin and subcutaneous tissue: Secondary | ICD-10-CM | POA: Insufficient documentation

## 2013-07-14 DIAGNOSIS — R5381 Other malaise: Secondary | ICD-10-CM | POA: Insufficient documentation

## 2013-07-14 DIAGNOSIS — F17200 Nicotine dependence, unspecified, uncomplicated: Secondary | ICD-10-CM | POA: Insufficient documentation

## 2013-07-14 DIAGNOSIS — N390 Urinary tract infection, site not specified: Secondary | ICD-10-CM

## 2013-07-14 DIAGNOSIS — I2510 Atherosclerotic heart disease of native coronary artery without angina pectoris: Secondary | ICD-10-CM | POA: Insufficient documentation

## 2013-07-14 DIAGNOSIS — Z95810 Presence of automatic (implantable) cardiac defibrillator: Secondary | ICD-10-CM | POA: Insufficient documentation

## 2013-07-14 DIAGNOSIS — M549 Dorsalgia, unspecified: Secondary | ICD-10-CM | POA: Insufficient documentation

## 2013-07-14 DIAGNOSIS — Z8669 Personal history of other diseases of the nervous system and sense organs: Secondary | ICD-10-CM | POA: Insufficient documentation

## 2013-07-14 DIAGNOSIS — IMO0002 Reserved for concepts with insufficient information to code with codable children: Secondary | ICD-10-CM | POA: Insufficient documentation

## 2013-07-14 HISTORY — DX: Atherosclerotic heart disease of native coronary artery without angina pectoris: I25.10

## 2013-07-14 LAB — URINALYSIS, ROUTINE W REFLEX MICROSCOPIC
Bilirubin Urine: NEGATIVE
Glucose, UA: NEGATIVE mg/dL
Ketones, ur: NEGATIVE mg/dL
Nitrite: NEGATIVE
Specific Gravity, Urine: 1.025 (ref 1.005–1.030)
Urobilinogen, UA: 1 mg/dL (ref 0.0–1.0)
pH: 6.5 (ref 5.0–8.0)

## 2013-07-14 LAB — BASIC METABOLIC PANEL
BUN: 9 mg/dL (ref 6–23)
CO2: 24 mEq/L (ref 19–32)
Chloride: 98 mEq/L (ref 96–112)
Creatinine, Ser: 0.5 mg/dL (ref 0.50–1.35)
GFR calc Af Amer: >90 mL/min (ref >90)
GFR calc non Af Amer: >90 mL/min (ref >90)
Glucose, Bld: 114 mg/dL — ABNORMAL HIGH (ref 70–99)
Potassium: 4.6 mEq/L (ref 3.5–5.1)

## 2013-07-14 LAB — CBC WITH DIFFERENTIAL/PLATELET
Basophils Relative: 0 % (ref 0–1)
HCT: 27.9 % — ABNORMAL LOW (ref 39.0–52.0)
Hemoglobin: 8.4 g/dL — ABNORMAL LOW (ref 13.0–17.0)
Lymphocytes Relative: 27 % (ref 12–46)
Lymphs Abs: 2 10*3/uL (ref 0.7–4.0)
MCHC: 30.1 g/dL (ref 30.0–36.0)
Monocytes Absolute: 0.6 10*3/uL (ref 0.1–1.0)
Monocytes Relative: 8 % (ref 3–12)
Neutro Abs: 4.6 10*3/uL (ref 1.7–7.7)
Neutrophils Relative %: 62 % (ref 43–77)
RBC: 3.36 MIL/uL — ABNORMAL LOW (ref 4.22–5.81)
WBC: 7.4 10*3/uL (ref 4.0–10.5)

## 2013-07-14 LAB — URINE MICROSCOPIC-ADD ON

## 2013-07-14 MED ORDER — OXYCODONE-ACETAMINOPHEN 10-325 MG PO TABS: 1.0000 | ORAL_TABLET | ORAL | Status: DC | PRN

## 2013-07-14 MED ORDER — SULFAMETHOXAZOLE-TMP DS 800-160 MG PO TABS
1.0000 | ORAL_TABLET | Freq: Once | ORAL | Status: AC
  Administered 2013-07-14: 1 via ORAL
  Filled 2013-07-14: qty 1

## 2013-07-14 MED ORDER — SODIUM CHLORIDE 0.9 % IV BOLUS (SEPSIS)
1000.0000 mL | Freq: Once | INTRAVENOUS | Status: AC
  Administered 2013-07-14: 1000 mL via INTRAVENOUS

## 2013-07-14 MED ORDER — SULFAMETHOXAZOLE-TRIMETHOPRIM 800-160 MG PO TABS: 1.0000 | ORAL_TABLET | Freq: Two times a day (BID) | ORAL | Status: DC

## 2013-07-14 MED ORDER — MORPHINE SULFATE 4 MG/ML IJ SOLN
4.0000 mg | Freq: Once | INTRAMUSCULAR | Status: AC
  Administered 2013-07-14: 4 mg via INTRAVENOUS
  Filled 2013-07-14: qty 1

## 2013-07-14 NOTE — ED Notes (Signed)
Kohut, MD at bedside using u/s to look at left forearm.
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
## 2013-07-14 NOTE — ED Notes (Signed)
Patient is self-catheterizing now for urine specimen. States he always does it himself even when at hospital.

## 2013-07-16 LAB — URINE CULTURE: Colony Count: >=100000

## 2013-07-17 ENCOUNTER — Telehealth (HOSPITAL_COMMUNITY): Payer: Self-pay | Admitting: Emergency Medicine

## 2013-07-17 NOTE — Progress Notes (Signed)
ED Antimicrobial Stewardship Positive Culture Follow Up   [name] is an 37 y.o. male who presented to [facility] on 07/14/2013 with a chief complaint of  Chief Complaint  Patient presents with  . Abscess  . Urinary Tract Infection  . Back Pain    Recent Results (from the past 720 hour(s))  URINE CULTURE     Status: None   Collection Time    07/14/13 12:15 PM      Result Value Range Status   Specimen Description URINE, CATHETERIZED   Final   Special Requests NONE   Final   Culture  Setup Time     Final   Value: 07/14/2013 19:19     Performed at [employer] Count     Final   Value: >=100,000 COLONIES/ML     Performed at [facility]   Culture     Final   Value: [facility] MORGANII     Performed at [facility]   Report Status 07/16/2013 FINAL   Final   Organism ID, Bacteria MORGANELLA MORGANII   Final    [x]  Treated with Bactrim, organism resistant to prescribed antimicrobial []  Patient discharged originally without antimicrobial agent and treatment is now indicated  New antibiotic prescription: Suprax 400mg  PO daily x 10 days  ED Provider: Teressa Lower, FNP   Mickeal Skinner 07/17/2013, 9:52 AM Infectious Diseases Pharmacist Phone# [phone]

## 2013-07-23 NOTE — ED Provider Notes (Signed)
Medical screening examination/treatment/procedure(s) were performed by non-physician practitioner and as supervising physician I was immediately available for consultation/collaboration.  Raeford Razor, MD 07/23/13 [phone]

## 2013-11-02 IMAGING — CR DG CHEST 1V PORT
1 series · 1 of 1 positions shown · non-contrast
Comparison: 03/13/2013, [DATE] hours.

CLINICAL DATA: Central line placement.

PORTABLE CHEST - 1 VIEW

[AP]
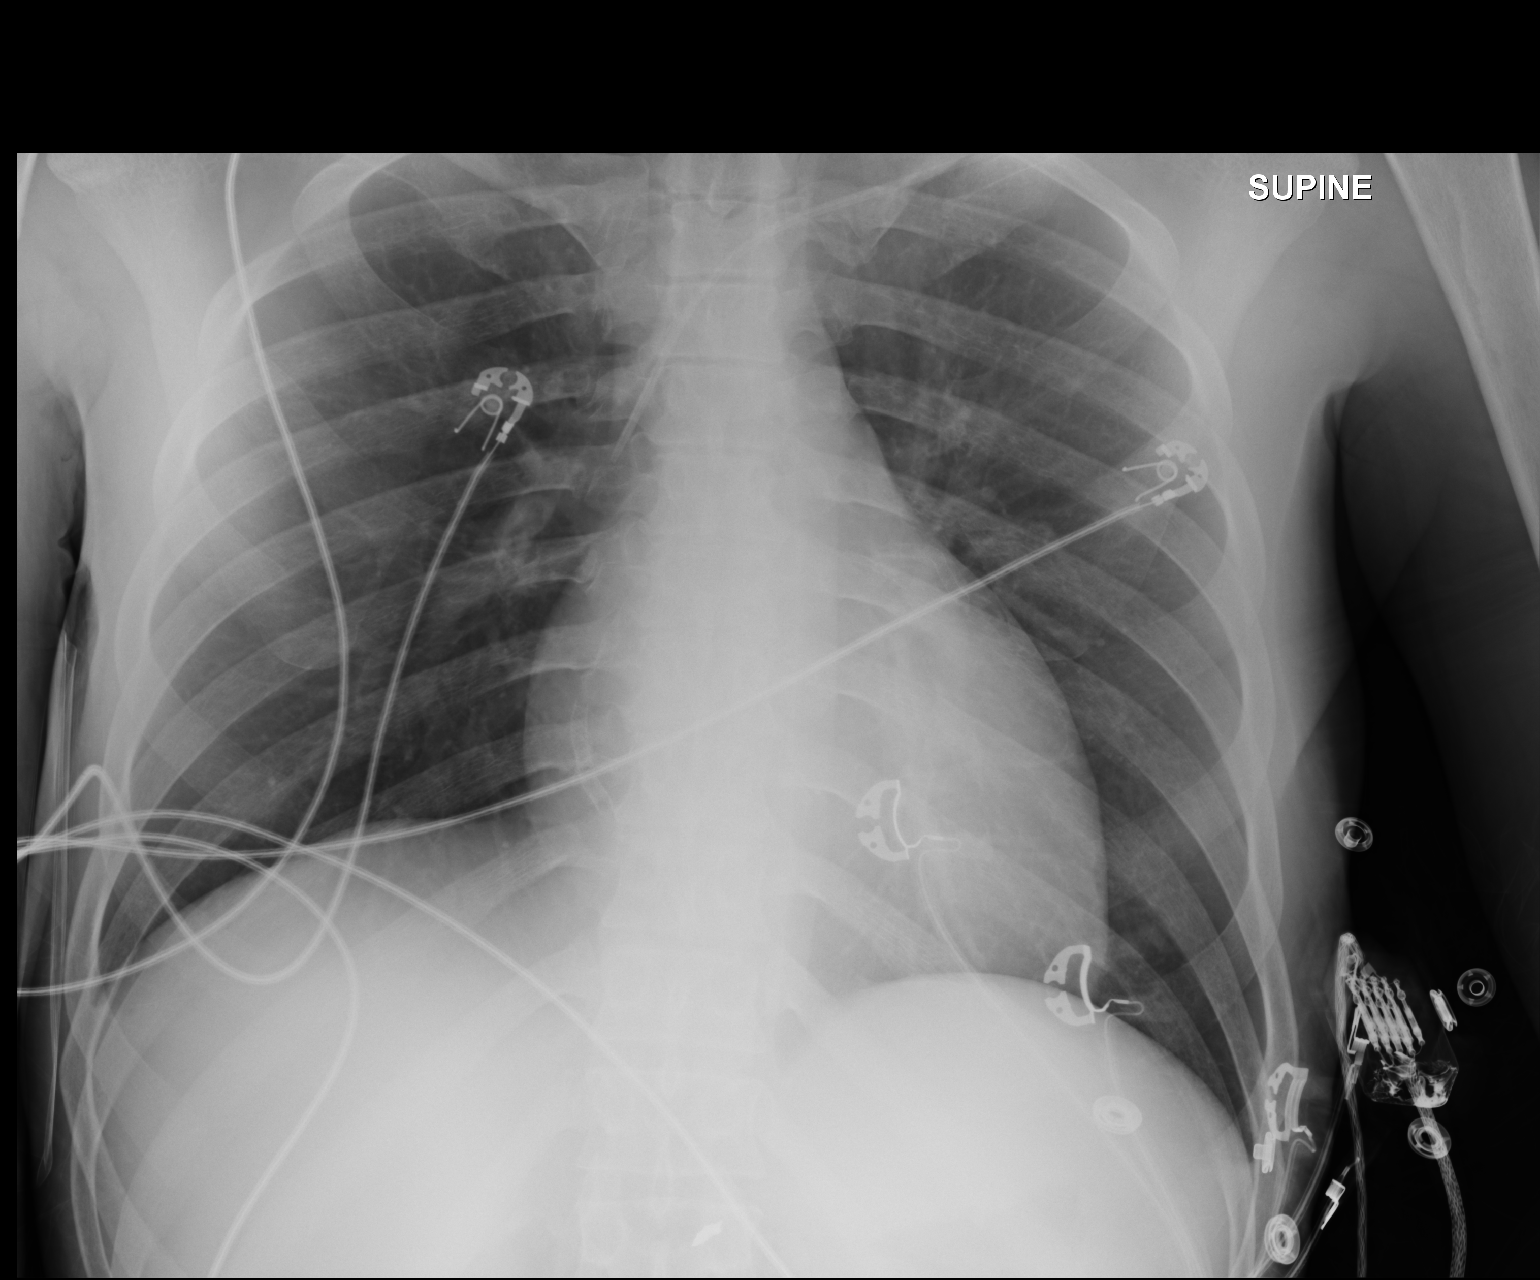

[1 of 1 positions shown; findings below may reference images not displayed]

FINDINGS: Left subclavian central line is new compared to prior
exam.  The tip of the central line is in the mid SVC at about the
level of the carina.  Cardiopericardial silhouette appears within
normal limits.  Right coronary artery stent.  No airspace disease.
No effusion. No pneumothorax.
IMPRESSION: Uncomplicated left subclavian central line placement with the tip
in the mid SVC.

## 2013-11-12 ENCOUNTER — Emergency Department (HOSPITAL_COMMUNITY): Payer: Medicaid Other

## 2013-11-12 ENCOUNTER — Inpatient Hospital Stay (HOSPITAL_COMMUNITY)
Admission: EM | Admit: 2013-11-12 | Discharge: 2013-11-21 | DRG: 811 | Disposition: A | Discharge status: Home Health | Payer: Medicaid Other | Attending: Internal Medicine | Admitting: Internal Medicine

## 2013-11-12 ENCOUNTER — Encounter (HOSPITAL_COMMUNITY): Payer: Self-pay | Admitting: Emergency Medicine

## 2013-11-12 DIAGNOSIS — I319 Disease of pericardium, unspecified: Secondary | ICD-10-CM | POA: Diagnosis present

## 2013-11-12 DIAGNOSIS — R319 Hematuria, unspecified: Secondary | ICD-10-CM | POA: Diagnosis present

## 2013-11-12 DIAGNOSIS — I959 Hypotension, unspecified: Secondary | ICD-10-CM | POA: Diagnosis present

## 2013-11-12 DIAGNOSIS — S300XXA Contusion of lower back and pelvis, initial encounter: Secondary | ICD-10-CM

## 2013-11-12 DIAGNOSIS — M009 Pyogenic arthritis, unspecified: Secondary | ICD-10-CM

## 2013-11-12 DIAGNOSIS — I5022 Chronic systolic (congestive) heart failure: Secondary | ICD-10-CM

## 2013-11-12 DIAGNOSIS — D649 Anemia, unspecified: Secondary | ICD-10-CM | POA: Diagnosis present

## 2013-11-12 DIAGNOSIS — K4090 Unilateral inguinal hernia, without obstruction or gangrene, not specified as recurrent: Secondary | ICD-10-CM

## 2013-11-12 DIAGNOSIS — D509 Iron deficiency anemia, unspecified: Secondary | ICD-10-CM

## 2013-11-12 DIAGNOSIS — N368 Other specified disorders of urethra: Secondary | ICD-10-CM | POA: Diagnosis present

## 2013-11-12 DIAGNOSIS — G8929 Other chronic pain: Secondary | ICD-10-CM | POA: Diagnosis present

## 2013-11-12 DIAGNOSIS — I2510 Atherosclerotic heart disease of native coronary artery without angina pectoris: Secondary | ICD-10-CM | POA: Diagnosis present

## 2013-11-12 DIAGNOSIS — M4628 Osteomyelitis of vertebra, sacral and sacrococcygeal region: Secondary | ICD-10-CM

## 2013-11-12 DIAGNOSIS — G8220 Paraplegia, unspecified: Secondary | ICD-10-CM

## 2013-11-12 DIAGNOSIS — L89154 Pressure ulcer of sacral region, stage 4: Secondary | ICD-10-CM

## 2013-11-12 DIAGNOSIS — I509 Heart failure, unspecified: Secondary | ICD-10-CM

## 2013-11-12 DIAGNOSIS — G609 Hereditary and idiopathic neuropathy, unspecified: Secondary | ICD-10-CM | POA: Diagnosis present

## 2013-11-12 DIAGNOSIS — R000 Tachycardia, unspecified: Secondary | ICD-10-CM | POA: Diagnosis present

## 2013-11-12 DIAGNOSIS — N390 Urinary tract infection, site not specified: Secondary | ICD-10-CM

## 2013-11-12 DIAGNOSIS — Z87440 Personal history of urinary (tract) infections: Secondary | ICD-10-CM

## 2013-11-12 DIAGNOSIS — F1210 Cannabis abuse, uncomplicated: Secondary | ICD-10-CM | POA: Diagnosis present

## 2013-11-12 DIAGNOSIS — L8994 Pressure ulcer of unspecified site, stage 4: Secondary | ICD-10-CM | POA: Diagnosis present

## 2013-11-12 DIAGNOSIS — L89109 Pressure ulcer of unspecified part of back, unspecified stage: Secondary | ICD-10-CM | POA: Diagnosis present

## 2013-11-12 DIAGNOSIS — M869 Osteomyelitis, unspecified: Secondary | ICD-10-CM

## 2013-11-12 DIAGNOSIS — L8993 Pressure ulcer of unspecified site, stage 3: Secondary | ICD-10-CM | POA: Diagnosis present

## 2013-11-12 DIAGNOSIS — E440 Moderate protein-calorie malnutrition: Secondary | ICD-10-CM | POA: Diagnosis present

## 2013-11-12 DIAGNOSIS — L89609 Pressure ulcer of unspecified heel, unspecified stage: Secondary | ICD-10-CM | POA: Diagnosis present

## 2013-11-12 DIAGNOSIS — D62 Acute posthemorrhagic anemia: Principal | ICD-10-CM | POA: Diagnosis present

## 2013-11-12 DIAGNOSIS — N179 Acute kidney failure, unspecified: Secondary | ICD-10-CM

## 2013-11-12 DIAGNOSIS — Z905 Acquired absence of kidney: Secondary | ICD-10-CM

## 2013-11-12 DIAGNOSIS — H409 Unspecified glaucoma: Secondary | ICD-10-CM | POA: Diagnosis present

## 2013-11-12 DIAGNOSIS — I428 Other cardiomyopathies: Secondary | ICD-10-CM | POA: Diagnosis present

## 2013-11-12 DIAGNOSIS — E43 Unspecified severe protein-calorie malnutrition: Secondary | ICD-10-CM

## 2013-11-12 DIAGNOSIS — R509 Fever, unspecified: Secondary | ICD-10-CM

## 2013-11-12 DIAGNOSIS — I10 Essential (primary) hypertension: Secondary | ICD-10-CM | POA: Diagnosis present

## 2013-11-12 DIAGNOSIS — R6510 Systemic inflammatory response syndrome (SIRS) of non-infectious origin without acute organ dysfunction: Secondary | ICD-10-CM

## 2013-11-12 DIAGNOSIS — L97429 Non-pressure chronic ulcer of left heel and midfoot with unspecified severity: Secondary | ICD-10-CM

## 2013-11-12 DIAGNOSIS — M86659 Other chronic osteomyelitis, unspecified thigh: Secondary | ICD-10-CM | POA: Diagnosis present

## 2013-11-12 DIAGNOSIS — Z1612 Extended spectrum beta lactamase (ESBL) resistance: Secondary | ICD-10-CM

## 2013-11-12 DIAGNOSIS — N319 Neuromuscular dysfunction of bladder, unspecified: Secondary | ICD-10-CM | POA: Diagnosis present

## 2013-11-12 DIAGNOSIS — Z95810 Presence of automatic (implantable) cardiac defibrillator: Secondary | ICD-10-CM

## 2013-11-12 DIAGNOSIS — F17200 Nicotine dependence, unspecified, uncomplicated: Secondary | ICD-10-CM | POA: Diagnosis present

## 2013-11-12 DIAGNOSIS — K922 Gastrointestinal hemorrhage, unspecified: Secondary | ICD-10-CM

## 2013-11-12 DIAGNOSIS — R079 Chest pain, unspecified: Secondary | ICD-10-CM

## 2013-11-12 DIAGNOSIS — R339 Retention of urine, unspecified: Secondary | ICD-10-CM

## 2013-11-12 DIAGNOSIS — A4101 Sepsis due to Methicillin susceptible Staphylococcus aureus: Secondary | ICD-10-CM

## 2013-11-12 DIAGNOSIS — I5043 Acute on chronic combined systolic (congestive) and diastolic (congestive) heart failure: Secondary | ICD-10-CM | POA: Diagnosis present

## 2013-11-12 DIAGNOSIS — Z79899 Other long term (current) drug therapy: Secondary | ICD-10-CM

## 2013-11-12 DIAGNOSIS — A499 Bacterial infection, unspecified: Secondary | ICD-10-CM

## 2013-11-12 DIAGNOSIS — L89509 Pressure ulcer of unspecified ankle, unspecified stage: Secondary | ICD-10-CM | POA: Diagnosis present

## 2013-11-12 DIAGNOSIS — I498 Other specified cardiac arrhythmias: Secondary | ICD-10-CM | POA: Diagnosis present

## 2013-11-12 DIAGNOSIS — Z933 Colostomy status: Secondary | ICD-10-CM

## 2013-11-12 DIAGNOSIS — A419 Sepsis, unspecified organism: Secondary | ICD-10-CM

## 2013-11-12 LAB — BASIC METABOLIC PANEL
BUN: 12 mg/dL (ref 6–23)
CALCIUM: 8.3 mg/dL — AB (ref 8.4–10.5)
CO2: 21 mEq/L (ref 19–32)
CREATININE: 0.62 mg/dL (ref 0.50–1.35)
Chloride: 98 mEq/L (ref 96–112)
Glucose, Bld: 88 mg/dL (ref 70–99)
POTASSIUM: 4.3 meq/L (ref 3.7–5.3)
Sodium: 134 mEq/L — ABNORMAL LOW (ref 137–147)

## 2013-11-12 LAB — CBC
HCT: 22.4 % — ABNORMAL LOW (ref 39.0–52.0)
HEMOGLOBIN: 6.8 g/dL — AB (ref 13.0–17.0)
MCH: 24.5 pg — ABNORMAL LOW (ref 26.0–34.0)
MCHC: 30.4 g/dL (ref 30.0–36.0)
MCV: 80.6 fL (ref 78.0–100.0)
PLATELETS: 573 10*3/uL — AB (ref 150–400)
RBC: 2.78 MIL/uL — ABNORMAL LOW (ref 4.22–5.81)
RDW: 16.4 % — AB (ref 11.5–15.5)
WBC: 12 10*3/uL — ABNORMAL HIGH (ref 4.0–10.5)

## 2013-11-12 LAB — CG4 I-STAT (LACTIC ACID): LACTIC ACID, VENOUS: 1.68 mmol/L (ref 0.5–2.2)

## 2013-11-12 LAB — PREPARE RBC (CROSSMATCH)

## 2013-11-12 LAB — POCT I-STAT TROPONIN I: Troponin i, poc: 0.03 ng/mL (ref 0.00–0.08)

## 2013-11-12 MED ORDER — ENSURE PLUS PO LIQD
237.0000 mL | Freq: Two times a day (BID) | ORAL | Status: DC
  Administered 2013-11-13 – 2013-11-19 (×10): 237 mL via ORAL
  Filled 2013-11-12 (×16): qty 237

## 2013-11-12 MED ORDER — SODIUM CHLORIDE 0.9 % IV SOLN
INTRAVENOUS | Status: DC
  Administered 2013-11-13 – 2013-11-19 (×2): via INTRAVENOUS

## 2013-11-12 MED ORDER — OXYBUTYNIN CHLORIDE 5 MG PO TABS
5.0000 mg | ORAL_TABLET | Freq: Three times a day (TID) | ORAL | Status: DC
  Administered 2013-11-13 – 2013-11-20 (×23): 5 mg via ORAL
  Filled 2013-11-12 (×27): qty 1

## 2013-11-12 MED ORDER — SODIUM CHLORIDE 0.9 % IV BOLUS (SEPSIS)
1000.0000 mL | Freq: Once | INTRAVENOUS | Status: AC
  Administered 2013-11-12: 1000 mL via INTRAVENOUS

## 2013-11-12 MED ORDER — ONDANSETRON HCL 4 MG PO TABS: 4.0000 mg | ORAL_TABLET | Freq: Four times a day (QID) | ORAL | Status: DC | PRN

## 2013-11-12 MED ORDER — MIRTAZAPINE 30 MG PO TABS
30.0000 mg | ORAL_TABLET | Freq: Every evening | ORAL | Status: DC | PRN
  Filled 2013-11-12: qty 1

## 2013-11-12 MED ORDER — FERROUS FUMARATE 325 (106 FE) MG PO TABS
1.0000 | ORAL_TABLET | Freq: Three times a day (TID) | ORAL | Status: DC
  Administered 2013-11-13 – 2013-11-21 (×24): 106 mg via ORAL
  Filled 2013-11-12 (×29): qty 1

## 2013-11-12 MED ORDER — ONDANSETRON HCL 4 MG/2ML IJ SOLN: 4.0000 mg | Freq: Four times a day (QID) | INTRAMUSCULAR | Status: DC | PRN

## 2013-11-12 MED ORDER — HYDROMORPHONE HCL PF 1 MG/ML IJ SOLN: 0.5000 mg | INTRAMUSCULAR | Status: DC | PRN

## 2013-11-12 MED ORDER — OXYCODONE HCL 5 MG PO TABS
15.0000 mg | ORAL_TABLET | ORAL | Status: DC | PRN
  Administered 2013-11-13 – 2013-11-16 (×16): 15 mg via ORAL
  Filled 2013-11-12 (×17): qty 3

## 2013-11-12 MED ORDER — HYDROMORPHONE HCL PF 1 MG/ML IJ SOLN
1.0000 mg | Freq: Once | INTRAMUSCULAR | Status: AC
  Administered 2013-11-12: 1 mg via INTRAVENOUS
  Filled 2013-11-12: qty 1

## 2013-11-12 MED ORDER — SODIUM CHLORIDE 0.9 % IJ SOLN: 3.0000 mL | Freq: Two times a day (BID) | INTRAMUSCULAR | Status: DC

## 2013-11-12 MED ORDER — ONDANSETRON HCL 4 MG/2ML IJ SOLN
4.0000 mg | Freq: Once | INTRAMUSCULAR | Status: AC
  Administered 2013-11-12: 4 mg via INTRAVENOUS
  Filled 2013-11-12: qty 2

## 2013-11-12 MED ORDER — SIMVASTATIN 40 MG PO TABS
40.0000 mg | ORAL_TABLET | Freq: Every day | ORAL | Status: DC
  Administered 2013-11-13 – 2013-11-20 (×8): 40 mg via ORAL
  Filled 2013-11-12 (×10): qty 1

## 2013-11-12 NOTE — ED Notes (Signed)
18 French 3 way foley catheter placed. Irrigated with 60cc with no urine return. Dr. Anitra Lauth informed

## 2013-11-12 NOTE — ED Notes (Addendum)
18 french foley placed, no urine returned. Irrigated foley x2 with 60cc each time. No urine returned from irrigation.

## 2013-11-12 NOTE — ED Notes (Signed)
Pt. Took 20mg  of home oxycodone, per Dr. Anitra Lauth.

## 2013-11-12 NOTE — ED Notes (Signed)
Attempted to place 24 French foley cath. Catheter would not advance to bladder, Dr. Anitra Lauth called to bedside to assist. Unsuccessful at placing catheter.
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
## 2013-11-12 NOTE — ED Notes (Signed)
Urologist at bedside.

## 2013-11-12 NOTE — ED Notes (Signed)
NOTIFIED PHYSICIAN OF PATIENTS LAB RESULTS OF CG4+ LACTIC ACID @17 :38 PM , 11/12/2013.

## 2013-11-12 NOTE — ED Notes (Signed)
Pt. In and out cath self, called this nurse into room. Pt. Had no urine return from catheter. Pt. Bladder scanned >2101ml in bladder. Dr. Anitra Lauth informed.

## 2013-11-12 NOTE — ED Notes (Signed)
Pt is here with urinating blood for a couple of days.  Pt is paraplegic and in and out caths self.  Pt reports sob and is coughing up brown sputum.  Pt reports hurting all over, Pt thinks he is dehydrated.  No blood thinners.  Pt states his blood pressure runs in the 80s.  Pt appears pale and weak.

## 2013-11-12 NOTE — ED Notes (Signed)
Pt. Reports productive cough x1 week and SOB. Has been taking benadryl and Claritin. Reports coughing up green sputum. Pt. Also reports bloody and cloudy urine, pt. Self caths. States x3 days ago began having chills. Pt. Alert and oriented x4.
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
## 2013-11-13 ENCOUNTER — Inpatient Hospital Stay (HOSPITAL_COMMUNITY): Payer: Medicaid Other

## 2013-11-13 DIAGNOSIS — I059 Rheumatic mitral valve disease, unspecified: Secondary | ICD-10-CM

## 2013-11-13 DIAGNOSIS — L8994 Pressure ulcer of unspecified site, stage 4: Secondary | ICD-10-CM

## 2013-11-13 DIAGNOSIS — Z87440 Personal history of urinary (tract) infections: Secondary | ICD-10-CM

## 2013-11-13 DIAGNOSIS — D62 Acute posthemorrhagic anemia: Principal | ICD-10-CM

## 2013-11-13 DIAGNOSIS — G8220 Paraplegia, unspecified: Secondary | ICD-10-CM

## 2013-11-13 DIAGNOSIS — S300XXA Contusion of lower back and pelvis, initial encounter: Secondary | ICD-10-CM

## 2013-11-13 DIAGNOSIS — E43 Unspecified severe protein-calorie malnutrition: Secondary | ICD-10-CM

## 2013-11-13 DIAGNOSIS — L89109 Pressure ulcer of unspecified part of back, unspecified stage: Secondary | ICD-10-CM

## 2013-11-13 DIAGNOSIS — R079 Chest pain, unspecified: Secondary | ICD-10-CM

## 2013-11-13 DIAGNOSIS — N390 Urinary tract infection, site not specified: Secondary | ICD-10-CM

## 2013-11-13 DIAGNOSIS — IMO0002 Reserved for concepts with insufficient information to code with codable children: Secondary | ICD-10-CM

## 2013-11-13 LAB — URINALYSIS, ROUTINE W REFLEX MICROSCOPIC
Bilirubin Urine: NEGATIVE
Glucose, UA: NEGATIVE mg/dL
Hgb urine dipstick: NEGATIVE
Ketones, ur: NEGATIVE mg/dL
NITRITE: POSITIVE — AB
PROTEIN: NEGATIVE mg/dL
Specific Gravity, Urine: 1.011 (ref 1.005–1.030)
Urobilinogen, UA: 1 mg/dL (ref 0.0–1.0)
pH: 6 (ref 5.0–8.0)

## 2013-11-13 LAB — CBC
HCT: 23.8 % — ABNORMAL LOW (ref 39.0–52.0)
HEMOGLOBIN: 7.5 g/dL — AB (ref 13.0–17.0)
MCH: 26 pg (ref 26.0–34.0)
MCHC: 31.5 g/dL (ref 30.0–36.0)
MCV: 82.6 fL (ref 78.0–100.0)
Platelets: 382 10*3/uL (ref 150–400)
RBC: 2.88 MIL/uL — ABNORMAL LOW (ref 4.22–5.81)
RDW: 15.8 % — ABNORMAL HIGH (ref 11.5–15.5)
WBC: 9.9 10*3/uL (ref 4.0–10.5)

## 2013-11-13 LAB — BASIC METABOLIC PANEL
BUN: 12 mg/dL (ref 6–23)
CHLORIDE: 100 meq/L (ref 96–112)
CO2: 17 mEq/L — ABNORMAL LOW (ref 19–32)
Calcium: 7.6 mg/dL — ABNORMAL LOW (ref 8.4–10.5)
Creatinine, Ser: 0.64 mg/dL (ref 0.50–1.35)
GFR calc non Af Amer: >90 mL/min (ref >90)
Glucose, Bld: 146 mg/dL — ABNORMAL HIGH (ref 70–99)
POTASSIUM: 4.6 meq/L (ref 3.7–5.3)
Sodium: 132 mEq/L — ABNORMAL LOW (ref 137–147)

## 2013-11-13 LAB — TROPONIN I: Troponin I: <0.3 ng/mL (ref <0.30)

## 2013-11-13 LAB — URINE MICROSCOPIC-ADD ON

## 2013-11-13 LAB — PRO B NATRIURETIC PEPTIDE: Pro B Natriuretic peptide (BNP): 15702 pg/mL — ABNORMAL HIGH (ref 0–125)

## 2013-11-13 LAB — TSH: TSH: 1.84 u[IU]/mL (ref 0.350–4.500)

## 2013-11-13 MED ORDER — HYDROMORPHONE HCL PF 1 MG/ML IJ SOLN
1.0000 mg | INTRAMUSCULAR | Status: DC | PRN
  Administered 2013-11-13 (×3): 1 mg via INTRAVENOUS
  Filled 2013-11-13 (×3): qty 1

## 2013-11-13 MED ORDER — SODIUM CHLORIDE 0.9 % IV SOLN: INTRAVENOUS | Status: AC

## 2013-11-13 MED ORDER — FUROSEMIDE 20 MG PO TABS
20.0000 mg | ORAL_TABLET | Freq: Two times a day (BID) | ORAL | Status: DC
  Administered 2013-11-13 – 2013-11-14 (×3): 20 mg via ORAL
  Filled 2013-11-13 (×7): qty 1

## 2013-11-13 MED ORDER — HYDROMORPHONE HCL PF 1 MG/ML IJ SOLN
1.0000 mg | INTRAMUSCULAR | Status: DC | PRN
  Administered 2013-11-13: 1 mg via INTRAVENOUS
  Filled 2013-11-13: qty 1

## 2013-11-13 MED ORDER — GI COCKTAIL ~~LOC~~
30.0000 mL | Freq: Once | ORAL | Status: AC
  Administered 2013-11-13: 30 mL via ORAL
  Filled 2013-11-13: qty 30

## 2013-11-13 MED ORDER — LORAZEPAM 2 MG/ML IJ SOLN
1.0000 mg | Freq: Two times a day (BID) | INTRAMUSCULAR | Status: DC | PRN
  Administered 2013-11-15 – 2013-11-20 (×3): 1 mg via INTRAVENOUS
  Filled 2013-11-13 (×4): qty 1

## 2013-11-13 MED ORDER — MIDODRINE HCL 5 MG PO TABS
5.0000 mg | ORAL_TABLET | Freq: Three times a day (TID) | ORAL | Status: DC
  Administered 2013-11-13 – 2013-11-21 (×23): 5 mg via ORAL
  Filled 2013-11-13 (×30): qty 1

## 2013-11-13 MED ORDER — ONDANSETRON HCL 4 MG/2ML IJ SOLN
4.0000 mg | Freq: Four times a day (QID) | INTRAMUSCULAR | Status: DC | PRN
Start: 2013-11-13 — End: 2013-11-21
  Administered 2013-11-13 – 2013-11-20 (×12): 4 mg via INTRAVENOUS
  Filled 2013-11-13 (×15): qty 2

## 2013-11-13 MED ORDER — JUVEN PO PACK
1.0000 | PACK | Freq: Two times a day (BID) | ORAL | Status: DC
  Administered 2013-11-14 – 2013-11-16 (×2): 1 via ORAL
  Filled 2013-11-13 (×16): qty 1

## 2013-11-13 MED ORDER — ADULT MULTIVITAMIN W/MINERALS CH
1.0000 | ORAL_TABLET | Freq: Every day | ORAL | Status: DC
  Administered 2013-11-13 – 2013-11-17 (×5): 1 via ORAL
  Filled 2013-11-13 (×9): qty 1

## 2013-11-13 MED ORDER — HYDROMORPHONE HCL PF 1 MG/ML IJ SOLN
3.0000 mg | INTRAMUSCULAR | Status: DC | PRN
  Administered 2013-11-13: 3 mg via INTRAVENOUS
  Administered 2013-11-13: 2 mg via INTRAVENOUS
  Administered 2013-11-14 – 2013-11-15 (×7): 3 mg via INTRAVENOUS
  Filled 2013-11-13 (×9): qty 3

## 2013-11-13 MED ORDER — DEXTROSE 5 % IV SOLN
1.0000 g | Freq: Two times a day (BID) | INTRAVENOUS | Status: DC
  Administered 2013-11-13 – 2013-11-15 (×5): 1 g via INTRAVENOUS
  Filled 2013-11-13 (×8): qty 1

## 2013-11-13 MED ORDER — PANTOPRAZOLE SODIUM 40 MG PO TBEC
40.0000 mg | DELAYED_RELEASE_TABLET | Freq: Every day | ORAL | Status: DC
  Administered 2013-11-13 – 2013-11-15 (×3): 40 mg via ORAL
  Filled 2013-11-13 (×2): qty 1
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
## 2013-11-13 NOTE — Progress Notes (Signed)
  Echocardiogram 2D Echocardiogram has been performed.  [name] [name] FRANCES 11/13/2013, 7:30 PM
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
## 2013-11-13 NOTE — Progress Notes (Signed)
Utilization review completed.  
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
## 2013-11-13 NOTE — Progress Notes (Addendum)
New Admission Note: Late Entry   Arrival Method: Via stretcher from the ED with Nurse Tech Rise Paganini)  Mental Orientation: Alert and Oriented X4 Telemetry: Placed on box [phone], running sinus tachy Assessment: Completed Skin: Stage 4 on sacrum, bilateral heel wounds  IV: Clean, dry and intact. IV fluids running Pain: Stated 10 out of 10, sacral wound. Pain medication given  Tubes: Left lower quadrant colostomy. 1 piece set. Clean, dry and intact. Stoma looks clean  Safety Measures: Safety Fall Prevention Plan has been given, discussed and signed Admission: Completed [address] Orientation: Patient has been orientated to the room, unit and staff.  Family: No family at bedside  Orders have been reviewed and implemented. Will continue to monitor the patient. Call light has been placed within reach and bed alarm has been activated.   Patient had two bags of belongings and his own wheel chair at bedside. He took out his money and wished for it too be locked up. He kept $32 dollars on him so that he may order out when he pleases. He gave me $180 in cash, [employer] debit card, Radiation protection practitioner, [employer] card. All belongings were placed in security envelope, patient signed and I took to security office to be locked away.    Patient refused for me to look at his ankle/heel wounds bilateral feet. Says that the dressing does not need changing and that he only wanted his sacral wound assessed. Will pass this information on to the day shift nurse.   [employer] BSN, RN  Phone number: [phone]

## 2013-11-13 NOTE — Consult Note (Signed)
Based on review of the records this patient has been followed most recently for his wound per Dr. Kelly Splinter (Plastics) and based on the urologist note for possible need for urinary diversion needed and complicated Stage IV pressure ulcer I feel this patient would be best served to have a plastics consult while inpatient status. This would open any options for needed wound care and other surgical intervention at the same time.   Will order moist gauze dressings based on his history with care from [facility], change BID.  Add air mattress if not in place currently, notify hospitalist of need for plastics consult.   M.Rayssa Atha RN,CWOCN [phone]

## 2013-11-13 NOTE — Progress Notes (Signed)
Patient was found to have a bottle of oxycodone at his bedside underneath his pillow. Informed the patient that I needed to take his medication, count it and then take it to pharmacy. He stated that he was keeping his medication because he goes to the pain clinic and that he did not want to lose his medication due to the fact that he could possible be kicked out of the pain clinic if he doesn't return with the original bottle. Tried re-educating him again about letting me take the medication and he stated no again. Will inform the day shift RN about the medication at the bedside. Patient stated that he would not take any of the medication while being in the hospital. Will continue to monitor and assess the patient.   [employer], RN  [facility] [address] [phone]
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
## 2013-11-13 NOTE — Progress Notes (Signed)
[facility]  Patient Status: Active (receiving services up to time of hospitalization)  [facility] is providing the following services: RN  If patient discharges after hours, please call [phone].   [name] [name] 11/13/2013, 11:16 AM
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
## 2013-11-14 ENCOUNTER — Inpatient Hospital Stay (HOSPITAL_COMMUNITY): Payer: Medicaid Other

## 2013-11-14 ENCOUNTER — Encounter (HOSPITAL_COMMUNITY): Payer: Self-pay | Admitting: Plastic Surgery

## 2013-11-14 LAB — URINE CULTURE: Colony Count: >=100000

## 2013-11-14 LAB — CORTISOL: CORTISOL PLASMA: 19 ug/dL

## 2013-11-14 LAB — PREPARE RBC (CROSSMATCH)

## 2013-11-14 MED ORDER — PROMETHAZINE HCL 25 MG/ML IJ SOLN
12.5000 mg | Freq: Once | INTRAMUSCULAR | Status: AC
Start: 2013-11-14 — End: 2013-11-14
  Administered 2013-11-14: 12.5 mg via INTRAVENOUS
  Filled 2013-11-14: qty 1

## 2013-11-14 MED ORDER — ALUM & MAG HYDROXIDE-SIMETH 200-200-20 MG/5ML PO SUSP
15.0000 mL | ORAL | Status: DC | PRN
  Administered 2013-11-14 – 2013-11-15 (×2): 15 mL via ORAL
  Filled 2013-11-14 (×2): qty 30

## 2013-11-14 MED ORDER — CARVEDILOL 3.125 MG PO TABS
3.1250 mg | ORAL_TABLET | Freq: Two times a day (BID) | ORAL | Status: DC
  Administered 2013-11-14 – 2013-11-21 (×13): 3.125 mg via ORAL
  Filled 2013-11-14 (×16): qty 1

## 2013-11-14 MED ORDER — FUROSEMIDE 20 MG PO TABS
20.0000 mg | ORAL_TABLET | Freq: Every day | ORAL | Status: DC
  Administered 2013-11-16 – 2013-11-18 (×3): 20 mg via ORAL
  Filled 2013-11-14 (×4): qty 1

## 2013-11-14 NOTE — Progress Notes (Signed)
When entered pt's room to check blood vitals, pt c/o SOB and difficulty breathing. Vitals WNL. O2 sats 100% on RA. Placed on 2L O2 n/c for patient's comfort. Lungs clear throughout. Blood transfusion rate decreased to 100 ml/hr. Dr. Blake Divine notified - orders rec'd for [facility]. Stated it was ok to continue blood transfusion as ordered. Pt appears comfortable at present. Will continue to monitor.  Pt had previously c/o severe indigestion. Maalox was ordered and also given at this time.  Kathlene November, Bryton Waight [place]

## 2013-11-14 NOTE — Progress Notes (Signed)
BP 75/52, HR 111. Dr. Blake Divine stated to hold PRN dilaudid until BP returns to baseline. Will monitor.  Kathlene November, Olimpia Tinch [place]

## 2013-11-14 NOTE — Progress Notes (Signed)
When assessing 15 mins vitals after initiating blood transfusion, pt's PIV was noted to be leaking. Blood transfusion stopped, new PIV inserted, and blood transfusion resumed. Will monitor.  [name] [name], Ceri Mayer [place]
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
## 2013-11-14 NOTE — Progress Notes (Signed)
Pt has performed his own dressing change this am. Dondra Spry, RN

## 2013-11-14 NOTE — Progress Notes (Signed)
Patient refusing to have RN change sacrum dressing. States he's very insecure about his wound. Will inform night shift RN.  Kathlene November, [place]

## 2013-11-14 NOTE — Progress Notes (Signed)
Patient ID: [name] [name], male   DOB: 02/18/1976, 38 y.o.   MRN: [id number]    Subjective:  Concerned about Hb not being over 8   Objective:  Filed Vitals:   11/13/13 1346 11/13/13 1743 11/13/13 2119 11/14/13 0507  BP: 79/58 91/58 80/55  81/48  Pulse:  122 124 118  Temp:  98 F (36.7 C) 97.9 F (36.6 C) 98 F (36.7 C)  TempSrc:  Oral Oral Oral  Resp:  18 20 18   Height:      Weight:      SpO2:  100% 99% 100%    Intake/Output from previous day:  Intake/Output Summary (Last 24 hours) at 11/14/13 [id number] Last data filed at 11/14/13 0522  Gross per 24 hour  Intake   1285 ml  Output    350 ml  Net    935 ml    Physical Exam: Affect appropriate Chronically ill black male  HEENT: normal Neck supple with no adenopathy JVP normal no bruits no thyromegaly Lungs clear with no wheezing and good diaphragmatic motion Heart:  S1/S2 summation gallop and flow murmur, no rub,  or click PMI normal Abdomen: benighn, BS positve, no tenderness, no AAA no bruit.  No HSM or HJR Distal pulses intact with no bruits No edema LE paraplegia  Skin warm and dry    Lab Results: Basic Metabolic Panel:  Recent Labs  30/86/5700/11/25 1512 11/13/13 0620  NA 134* 132*  K 4.3 4.6  CL 98 100  CO2 21 17*  GLUCOSE 88 146*  BUN 12 12  CREATININE 0.62 0.64  CALCIUM 8.3* 7.6*   Liver Function Tests: No results found for this basename: AST, ALT, ALKPHOS, BILITOT, PROT, ALBUMIN,  in the last 72 hours No results found for this basename: LIPASE, AMYLASE,  in the last 72 hours CBC:  Recent Labs  11/12/13 1512 11/13/13 0620  WBC 12.0* 9.9  HGB 6.8* 7.5*  HCT 22.4* 23.8*  MCV 80.6 82.6  PLT 573* 382   Cardiac Enzymes:  Recent Labs  11/13/13 0630  TROPONINI <0.30   Thyroid Function Tests:  Recent Labs  11/13/13 0630  TSH 1.840    Imaging: Dg Chest 2 View  11/13/2013   CLINICAL DATA:  Shortness of breath, coronary artery disease.  EXAM: CHEST  2 VIEW  COMPARISON:  November 12, 2013, March 13, 2013  FINDINGS: The heart size and mediastinal contours are stable. . There is no focal infiltrate, pulmonary edema, or pleural effusion. The visualized skeletal structures are stable.  IMPRESSION: No active cardiopulmonary disease.   Electronically Signed   By: Sherian ReinWei-Chen  Lin M.D.   On: 11/13/2013 10:36   Dg Chest 2 View  11/12/2013   CLINICAL DATA:  Hematuria.  Degeneration.  EXAM: CHEST  2 VIEW  COMPARISON:  03/13/2013 and 01/30/2013  FINDINGS: The heart size and mediastinal contours are within normal limits. Both lungs are clear. The visualized skeletal structures are unremarkable.  IMPRESSION: No active cardiopulmonary disease.   Electronically Signed   By: Myles RosenthalJohn  Stahl M.D.   On: 11/12/2013 16:21   Dg Abd Portable 1v  11/12/2013   CLINICAL DATA:  Hematuria and dehydration  EXAM: PORTABLE ABDOMEN - 1 VIEW  COMPARISON:  Abdominal CT 03/16/2013  FINDINGS: There is a left lower quadrant colostomy. Diffuse formed stool throughout the colon. No small bowel obstruction.  Chronic osteomyelitis (with sclerosis, osteolysis, and chronic periosteal reaction) of the left hip and pelvis with visible, large decubitus ulcer. There is chronic superior dislocation  of the left hip. Appearance is grossly stable from previous CT.  Clear lung bases.  IMPRESSION: 1. Probable constipation. 2. Chronic osteomyelitis of the left hip and pelvis from large decubitus ulcer.   Electronically Signed   By: [name] M.D.   On: 11/12/2013 22:43    Cardiac Studies:  ECG:  ST poor R wave progression no acute changes    Telemetry:  SR ST no arrhythmia  Echo: EF 25-30%  Chronic   Medications:   . ceFEPime (MAXIPIME) IV  1 g Intravenous Q12H  . ENSURE PLUS  237 mL Oral BID BM  . ferrous fumarate  1 tablet Oral TID  . furosemide  20 mg Oral BID  . midodrine  5 mg Oral TID WC  . multivitamin with minerals  1 tablet Oral Daily  . nutrition supplement (JUVEN)  1 packet Oral BID BM  . oxybutynin  5 mg Oral TID  .  pantoprazole  40 mg Oral Daily  . simvastatin  40 mg Oral QHS     . sodium chloride 10 mL/hr at 11/14/13 0300    Assessment/Plan:  Anemia:  Consider transfusing one more unit  Continue iron replacement w/u per primary service CHF:  Chronic resume low dose coreg despite low BP should be able to tolerate 3.125 bid hold for Systolic BP under 70  Increasing diastolic filling time may actually help BP and filling pressures  Decrease Lasix to daily  Chol:  On statin   Charlton Haws 11/14/2013, 8:24 AM
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
## 2013-11-14 NOTE — Progress Notes (Signed)
Pt continues to have a bottle of pain medicine at bedside. Reported off to this RN by night shift RN that there were 10 pills in the bottle - this count was verified with patient. Patient continues to state that he will not take any of his own pills, but does not feel comfortable having them taken to the safe as they were lost during a previous admission. States this causes problems with his doctors at the pain clinic. Will continue to monitor.  Kathlene November, Monti Jilek [place]

## 2013-11-14 NOTE — Progress Notes (Signed)
Pt with complaints of nausea. To early for next prn dose of zofran. Text page to K. Wellsite geologist. N.O for 1 time dose phenergan 12.5 mg. Admin per order. Pills at bedside counted at 10 as previously counted. Dondra Spry, RN

## 2013-11-14 NOTE — Progress Notes (Signed)
Pharmacist Heart Failure Core Measure Documentation  Assessment: [name] [name] has an EF documented as 15% on 11/13/13 by ECHO.  Rationale: Heart failure patients with left ventricular systolic dysfunction (LVSD) and an EF < 40% should be prescribed an angiotensin converting enzyme inhibitor (ACEI) or angiotensin receptor blocker (ARB) at discharge unless a contraindication is documented in the medical record.  This patient is not currently on an ACEI or ARB for HF.  This note is being placed in the record in order to provide documentation that a contraindication to the use of these agents is present for this encounter.  ACE Inhibitor or Angiotensin Receptor Blocker is contraindicated (specify all that apply)  []   ACEI allergy AND ARB allergy []   Angioedema []   Moderate or severe aortic stenosis []   Hyperkalemia [x]   Hypotension []   Renal artery stenosis []   Worsening renal function, preexisting renal disease or dysfunction  [name] [name], PharmD.  Clinical Pharmacist Pager [phone]

## 2013-11-15 LAB — TYPE AND SCREEN
ABO/RH(D): O POS
Antibody Screen: NEGATIVE
UNIT DIVISION: 0
Unit division: 0
Unit division: 0

## 2013-11-15 LAB — BASIC METABOLIC PANEL
BUN: 18 mg/dL (ref 6–23)
CHLORIDE: 99 meq/L (ref 96–112)
CO2: 17 mEq/L — ABNORMAL LOW (ref 19–32)
CREATININE: 0.88 mg/dL (ref 0.50–1.35)
Calcium: 8 mg/dL — ABNORMAL LOW (ref 8.4–10.5)
GFR calc Af Amer: >90 mL/min (ref >90)
GFR calc non Af Amer: >90 mL/min (ref >90)
Glucose, Bld: 141 mg/dL — ABNORMAL HIGH (ref 70–99)
Potassium: 5 mEq/L (ref 3.7–5.3)
Sodium: 132 mEq/L — ABNORMAL LOW (ref 137–147)

## 2013-11-15 LAB — CBC WITH DIFFERENTIAL/PLATELET
Basophils Absolute: 0 10*3/uL (ref 0.0–0.1)
Basophils Relative: 0 % (ref 0–1)
EOS ABS: 0 10*3/uL (ref 0.0–0.7)
EOS PCT: 0 % (ref 0–5)
HCT: 27.4 % — ABNORMAL LOW (ref 39.0–52.0)
HEMOGLOBIN: 8.9 g/dL — AB (ref 13.0–17.0)
Lymphocytes Relative: 14 % (ref 12–46)
Lymphs Abs: 1.9 10*3/uL (ref 0.7–4.0)
MCH: 27.1 pg (ref 26.0–34.0)
MCHC: 32.5 g/dL (ref 30.0–36.0)
MCV: 83.3 fL (ref 78.0–100.0)
MONOS PCT: 4 % (ref 3–12)
Monocytes Absolute: 0.5 10*3/uL (ref 0.1–1.0)
Neutro Abs: 10.7 10*3/uL — ABNORMAL HIGH (ref 1.7–7.7)
Neutrophils Relative %: 82 % — ABNORMAL HIGH (ref 43–77)
Platelets: 361 10*3/uL (ref 150–400)
RBC: 3.29 MIL/uL — AB (ref 4.22–5.81)
RDW: 16.3 % — ABNORMAL HIGH (ref 11.5–15.5)
WBC: 13.1 10*3/uL — ABNORMAL HIGH (ref 4.0–10.5)

## 2013-11-15 LAB — MAGNESIUM: Magnesium: 2.1 mg/dL (ref 1.5–2.5)

## 2013-11-15 LAB — PREALBUMIN: Prealbumin: <3 mg/dL — ABNORMAL LOW (ref 17.0–34.0)

## 2013-11-15 MED ORDER — HYDROMORPHONE HCL PF 1 MG/ML IJ SOLN
2.0000 mg | INTRAMUSCULAR | Status: DC | PRN
  Administered 2013-11-15 – 2013-11-16 (×6): 2 mg via INTRAVENOUS
  Filled 2013-11-15 (×7): qty 2

## 2013-11-15 MED ORDER — DEXTROSE 5 % IV SOLN
1.0000 g | INTRAVENOUS | Status: DC
  Administered 2013-11-15 – 2013-11-20 (×5): 1 g via INTRAVENOUS
  Filled 2013-11-15 (×8): qty 10

## 2013-11-15 MED ORDER — PANTOPRAZOLE SODIUM 40 MG PO TBEC
40.0000 mg | DELAYED_RELEASE_TABLET | Freq: Two times a day (BID) | ORAL | Status: DC
  Administered 2013-11-15 – 2013-11-21 (×10): 40 mg via ORAL
  Filled 2013-11-15 (×10): qty 1

## 2013-11-15 NOTE — Progress Notes (Signed)
Patient ID: [name] [name], male   DOB: [birth year], 38 y.o.   MRN: [id number]    Subjective:  Concerned about Hb not being over 8   Objective:  Filed Vitals:   11/14/13 1840 11/14/13 2052 11/15/13 0506 11/15/13 0822  BP: 84/62 83/61 81/52  70/51  Pulse: 119 124 101 55  Temp: 97.3 F (36.3 C) 97.3 F (36.3 C) 97.4 F (36.3 C) 97.3 F (36.3 C)  TempSrc: Axillary Axillary Oral Oral  Resp: 18 18 18 15   Height:      Weight:  145 lb 15.1 oz (66.2 kg)    SpO2: 99% 100% 95% 86%    Intake/Output from previous day:  Intake/Output Summary (Last 24 hours) at 11/15/13 4287 Last data filed at 11/15/13 6811  Gross per 24 hour  Intake   1585 ml  Output    450 ml  Net   1135 ml    Physical Exam: Affect appropriate Chronically ill black male  HEENT: normal Neck supple with no adenopathy JVP normal no bruits no thyromegaly Lungs clear with no wheezing and good diaphragmatic motion Heart:  S1/S2 summation gallop and flow murmur, no rub,  or click PMI normal Abdomen: benighn, BS positve, no tenderness, no AAA no bruit.  No HSM or HJR Distal pulses intact with no bruits No edema LE paraplegia  Skin warm and dry    Lab Results: Basic Metabolic Panel:  Recent Labs  57/26/20 0620 11/15/13 0422  NA 132* 132*  K 4.6 5.0  CL 100 99  CO2 17* 17*  GLUCOSE 146* 141*  BUN 12 18  CREATININE 0.64 0.88  CALCIUM 7.6* 8.0*  MG  --  2.1   Liver Function Tests: No results found for this basename: AST, ALT, ALKPHOS, BILITOT, PROT, ALBUMIN,  in the last 72 hours No results found for this basename: LIPASE, AMYLASE,  in the last 72 hours CBC:  Recent Labs  11/13/13 0620 11/15/13 0422  WBC 9.9 13.1*  NEUTROABS  --  10.7*  HGB 7.5* 8.9*  HCT 23.8* 27.4*  MCV 82.6 83.3  PLT 382 361   Cardiac Enzymes:  Recent Labs  11/13/13 0630  TROPONINI <0.30   Thyroid Function Tests:  Recent Labs  11/13/13 0630  TSH 1.840    Imaging: Dg Chest 2 View  11/13/2013   CLINICAL  DATA:  Shortness of breath, coronary artery disease.  EXAM: CHEST  2 VIEW  COMPARISON:  November 12, 2013, March 13, 2013  FINDINGS: The heart size and mediastinal contours are stable. . There is no focal infiltrate, pulmonary edema, or pleural effusion. The visualized skeletal structures are stable.  IMPRESSION: No active cardiopulmonary disease.   Electronically Signed   By: [name] M.D.   On: 11/13/2013 10:36   Dg Chest Port 1 View  11/14/2013   CLINICAL DATA:  Shortness of breath  EXAM: PORTABLE CHEST - 1 VIEW  COMPARISON:  12/03/2013  FINDINGS: Stable cardiomegaly.  Lungs clear.  . No effusion. Visualized skeletal structures are unremarkable.  IMPRESSION: No acute cardiopulmonary disease.  Stable cardiomegaly.   Electronically Signed   By: [name] M.D.   On: 11/14/2013 20:13    Cardiac Studies:  ECG:  ST poor R wave progression no acute changes    Telemetry:  SR ST no arrhythmia  Echo: EF 25-30%  Chronic   Medications:   . carvedilol  3.125 mg Oral BID  . ceFEPime (MAXIPIME) IV  1 g Intravenous Q12H  . ENSURE PLUS  237 mL Oral BID BM  . ferrous fumarate  1 tablet Oral TID  . furosemide  20 mg Oral Daily  . midodrine  5 mg Oral TID WC  . multivitamin with minerals  1 tablet Oral Daily  . nutrition supplement (JUVEN)  1 packet Oral BID BM  . oxybutynin  5 mg Oral TID  . pantoprazole  40 mg Oral Daily  . simvastatin  40 mg Oral QHS     . sodium chloride 10 mL/hr at 11/14/13 0300    Assessment/Plan:  Anemia:  Consider transfusing one more unit  In light of dyspnea and low BP  Continue iron replacement w/u per primary service CHF:  Chronic tolerating coreg 3.125 bid  hold forSystolic BP under 70  Increasing diastolic filling time may actually help BP and filling pressures  DecreaseLasix to daily  Follow K a bit high  Chol:  On statin   Will sign off    Charlton Hawseter Horace Lukas 11/15/2013, 9:21 AM

## 2013-11-15 NOTE — Progress Notes (Signed)
Pt drowsy but arousable, vitals below. AM midodrine given. MD Blake Divine notified via text page.  Filed Vitals:   11/15/13 0822  BP: 70/51  Pulse: 55  Temp: 97.3 F (36.3 C)  Resp: 15
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
## 2013-11-15 NOTE — Progress Notes (Signed)
Attempt x 2 to change pt dressing, pt is refusing. MD is aware. Pt states he will change it himself tonight. All supplies available at bedside. Ostomy supplies also available at bedside. Will continue to monitor.

## 2013-11-16 LAB — GLUCOSE, CAPILLARY: Glucose-Capillary: 85 mg/dL (ref 70–99)

## 2013-11-16 MED ORDER — VITAMIN C 500 MG PO TABS
500.0000 mg | ORAL_TABLET | Freq: Every day | ORAL | Status: DC
Start: 2013-11-16 — End: 2013-11-21
  Administered 2013-11-16 – 2013-11-21 (×6): 500 mg via ORAL
  Filled 2013-11-16 (×6): qty 1

## 2013-11-16 MED ORDER — OXYCODONE HCL 5 MG PO TABS
15.0000 mg | ORAL_TABLET | ORAL | Status: DC
  Administered 2013-11-16 – 2013-11-21 (×26): 15 mg via ORAL
  Filled 2013-11-16 (×27): qty 3

## 2013-11-16 MED ORDER — WHITE PETROLATUM GEL
Status: AC
  Administered 2013-11-16: 0.2
  Filled 2013-11-16: qty 5

## 2013-11-16 MED ORDER — HYDROMORPHONE HCL PF 1 MG/ML IJ SOLN
1.0000 mg | INTRAMUSCULAR | Status: DC | PRN
  Administered 2013-11-16 – 2013-11-17 (×5): 1 mg via INTRAVENOUS
  Filled 2013-11-16 (×4): qty 1

## 2013-11-16 MED ORDER — ZINC SULFATE 220 (50 ZN) MG PO CAPS
220.0000 mg | ORAL_CAPSULE | Freq: Every day | ORAL | Status: DC
  Administered 2013-11-16 – 2013-11-19 (×4): 220 mg via ORAL
  Filled 2013-11-16 (×6): qty 1

## 2013-11-16 NOTE — Progress Notes (Signed)
Patient refused dressing change.He said'I could do it by myself later on."

## 2013-11-16 NOTE — Progress Notes (Signed)
Addendum: Patient is using oxygen at 2 lpm.

## 2013-11-16 NOTE — Progress Notes (Signed)
Patient refused wound dressing change to be done by RN and states he could do it himself. Supplies provided within reach, pt did his own dressing change.
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
## 2013-11-16 NOTE — Progress Notes (Signed)
Patient's BP in the 80s and 70s, advised and educated about constant pain medicines and blood pressures. Patient states he cannot bear pain and that blood pressures has always been low. Will continue to monitor.
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
## 2013-11-17 DIAGNOSIS — A419 Sepsis, unspecified organism: Secondary | ICD-10-CM

## 2013-11-17 DIAGNOSIS — M869 Osteomyelitis, unspecified: Secondary | ICD-10-CM

## 2013-11-17 LAB — GLUCOSE, CAPILLARY: Glucose-Capillary: 79 mg/dL (ref 70–99)

## 2013-11-17 MED ORDER — HYDROMORPHONE HCL PF 1 MG/ML IJ SOLN
1.0000 mg | INTRAMUSCULAR | Status: DC | PRN
  Administered 2013-11-17 – 2013-11-18 (×5): 1 mg via INTRAVENOUS
  Filled 2013-11-17 (×5): qty 1

## 2013-11-17 NOTE — Progress Notes (Signed)
Patient refused dressing change and states he would do it after 07:00am.

## 2013-11-17 NOTE — Progress Notes (Signed)
Patient ID: [name] [name]  male  [id number]    DOB: November 28, 1975    DOA: 11/12/2013  PCP: [name], MD  Assessment/Plan:  1. Hematuria- Resolved.  - secondary to urethral erosion, urology is following. Status post Foley catheter through the urethra into the sacral wound. Received 3 days of cefepime urine culture results show morganella, sensitive to rocephin and cefepime. He will receive a total of 7 days of IV antibiotics, currently on IV Rocephin.   2. Hypotension- patient has autonomic dysfunction secondary paraplegia, and says it his blood pressure runs usually in the 80s.  - Patient was started on Midodrine 5mg TID, no significant improvement on BP however he's asymptomatic, no dizziness/ lightheadedness   3. Chronic pain- we'll continue oxycodone IR 15 mg 4 times a day per his outpatient schedule with Dilaudid PRN for breakthrough pain. Patient has high tolerance to opiates.  4. Anemia- secondary to acute blood loss anemia, received 3 units of PRBC through hospitalization  5. CHF exacerbation- patient has a history of congestive heart failure with EF 15%, consulted cardiology for management of shortness of breath. Restarted Lasix 20 mg twice a day which was later changed to daily dose because of his hypotension.   6. Sinus tachycardia- Improved. heart rate is up due to holding off Coreg secondary to rebound tachycardia, BB was restarted  7. Decubitus ulcer- wound care consulted and Dr. Kelly Splinter following   8. Moderate malnutrition- patient has lost over 50 pounds since last year, continue Ensure supplements.   DVT Prophylaxis:  Code Status:  Family Communication: Patient is alert and oriented, discussed with patient  Disposition: TBD    Subjective: Complaining of pain, states that the current pain medications needs to be uptitrated. Does not feel too well today  Objective: Weight change:   Intake/Output Summary (Last 24 hours) at 11/17/13 1236 Last data filed at  11/17/13 0500  Gross per 24 hour  Intake    240 ml  Output   1725 ml  Net  -1485 ml   Blood pressure 72/45, pulse 115, temperature 99.5 F (37.5 C), temperature source Oral, resp. rate 19, height 6\' 2"  (1.88 m), weight 66.2 kg (145 lb 15.1 oz), SpO2 95.00%.  Physical Exam: General: Alert and awake, oriented x3, not in any acute distress. CVS: S1-S2 clear, no murmur rubs or gallops Chest: Decreased breath sound at the bases otherwise fairly clear, no rhonchi Abdomen: soft nontender, nondistended, normal bowel sounds  Extremities: no cyanosis, clubbing or edema noted bilaterally Neuro: Paraplegic  Lab Results: Basic Metabolic Panel:  Recent Labs Lab 11/13/13 0620 11/15/13 0422  NA 132* 132*  K 4.6 5.0  CL 100 99  CO2 17* 17*  GLUCOSE 146* 141*  BUN 12 18  CREATININE 0.64 0.88  CALCIUM 7.6* 8.0*  MG  --  2.1   Liver Function Tests: No results found for this basename: AST, ALT, ALKPHOS, BILITOT, PROT, ALBUMIN,  in the last 168 hours No results found for this basename: LIPASE, AMYLASE,  in the last 168 hours No results found for this basename: AMMONIA,  in the last 168 hours CBC:  Recent Labs Lab 11/13/13 0620 11/15/13 0422  WBC 9.9 13.1*  NEUTROABS  --  10.7*  HGB 7.5* 8.9*  HCT 23.8* 27.4*  MCV 82.6 83.3  PLT 382 361   Cardiac Enzymes:  Recent Labs Lab 11/13/13 0630  TROPONINI <0.30   BNP: No components found with this basename: POCBNP,  CBG:  Recent Labs Lab 11/16/13 0802 11/17/13 0751  GLUCAP 85 79     Micro Results: Recent Results (from the past 240 hour(s))  URINE CULTURE     Status: None   Collection Time    11/13/13 12:03 AM      Result Value Range Status   Specimen Description URINE, CLEAN CATCH   Final   Special Requests NONE   Final   Culture  Setup Time     Final   Value: 11/13/2013 04:46     Performed at [facility] Count     Final   Value: >=100,000 COLONIES/ML     Performed at [facility]    Culture     Final   Value: [facility] MORGANII     Performed at [facility]   Report Status 11/14/2013 FINAL   Final   Organism ID, Bacteria MORGANELLA MORGANII   Final    Studies/Results: Dg Chest 2 View  11/13/2013   CLINICAL DATA:  Shortness of breath, coronary artery disease.  EXAM: CHEST  2 VIEW  COMPARISON:  November 12, 2013, March 13, 2013  FINDINGS: The heart size and mediastinal contours are stable. . There is no focal infiltrate, pulmonary edema, or pleural effusion. The visualized skeletal structures are stable.  IMPRESSION: No active cardiopulmonary disease.   Electronically Signed   By: [name] M.D.   On: 11/13/2013 10:36   Dg Chest 2 View  11/12/2013   CLINICAL DATA:  Hematuria.  Degeneration.  EXAM: CHEST  2 VIEW  COMPARISON:  03/13/2013 and 01/30/2013  FINDINGS: The heart size and mediastinal contours are within normal limits. Both lungs are clear. The visualized skeletal structures are unremarkable.  IMPRESSION: No active cardiopulmonary disease.   Electronically Signed   By: [name] M.D.   On: 11/12/2013 16:21   Dg Chest Port 1 View  11/14/2013   CLINICAL DATA:  Shortness of breath  EXAM: PORTABLE CHEST - 1 VIEW  COMPARISON:  12/03/2013  FINDINGS: Stable cardiomegaly.  Lungs clear.  . No effusion. Visualized skeletal structures are unremarkable.  IMPRESSION: No acute cardiopulmonary disease.  Stable cardiomegaly.   Electronically Signed   By: [name] M.D.   On: 11/14/2013 20:13   Dg Abd Portable 1v  11/12/2013   CLINICAL DATA:  Hematuria and dehydration  EXAM: PORTABLE ABDOMEN - 1 VIEW  COMPARISON:  Abdominal CT 03/16/2013  FINDINGS: There is a left lower quadrant colostomy. Diffuse formed stool throughout the colon. No small bowel obstruction.  Chronic osteomyelitis (with sclerosis, osteolysis, and chronic periosteal reaction) of the left hip and pelvis with visible, large decubitus ulcer. 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 Appearance is  grossly stable from previous CT.  Clear lung bases.  IMPRESSION: 1. Probable constipation. 2. Chronic osteomyelitis of the left hip and pelvis from large decubitus ulcer.   Electronically Signed   By: [name] M.D.   On: 11/12/2013 22:43    Medications: Scheduled Meds: . carvedilol  3.125 mg Oral BID  . cefTRIAXone (ROCEPHIN)  IV  1 g Intravenous Q24H  . ENSURE PLUS  237 mL Oral BID BM  . ferrous fumarate  1 tablet Oral TID  . furosemide  20 mg Oral Daily  . midodrine  5 mg Oral TID WC  . multivitamin with minerals  1 tablet Oral Daily  . nutrition supplement (JUVEN)  1 packet Oral BID BM  . oxybutynin  5 mg Oral TID  . oxyCODONE  15 mg Oral Q4H  .  pantoprazole  40 mg Oral BID  . simvastatin  40 mg Oral QHS  . vitamin C  500 mg Oral Daily  . zinc sulfate  220 mg Oral Daily      LOS: 5 days   RAI,RIPUDEEP M.D. [facility] 11/17/2013, 12:36 PM Pager: [phone]  If 7PM-7AM, please contact night-coverage [email or web address] Password [email or web address]

## 2013-11-18 DIAGNOSIS — K922 Gastrointestinal hemorrhage, unspecified: Secondary | ICD-10-CM

## 2013-11-18 DIAGNOSIS — R509 Fever, unspecified: Secondary | ICD-10-CM

## 2013-11-18 MED ORDER — WHITE PETROLATUM GEL
Status: AC
  Administered 2013-11-18: 11:00:00
  Filled 2013-11-18: qty 5

## 2013-11-18 MED ORDER — HYDROMORPHONE HCL PF 1 MG/ML IJ SOLN
1.0000 mg | INTRAMUSCULAR | Status: DC | PRN
  Administered 2013-11-18 – 2013-11-20 (×11): 1 mg via INTRAVENOUS
  Administered 2013-11-21 (×3): 2 mg via INTRAVENOUS
  Filled 2013-11-18 (×3): qty 1
  Filled 2013-11-18 (×2): qty 2
  Filled 2013-11-18 (×4): qty 1
  Filled 2013-11-18: qty 2
  Filled 2013-11-18: qty 1
  Filled 2013-11-18: qty 2
  Filled 2013-11-18 (×6): qty 1

## 2013-11-18 NOTE — Progress Notes (Signed)
Patient ID: [name] [name]  male  YDX:[id number]    DOB: 17-Jul-1976    DOA: 11/12/2013  PCP: [name], MD  Assessment/Plan:  1. Hematuria- Resolved.  - secondary to urethral erosion, urology is following. Status post Foley catheter through the urethra into the sacral wound. Received 3 days of cefepime urine culture results show morganella, sensitive to rocephin and cefepime. He will receive a total of 7 days of IV antibiotics, currently on IV Rocephin.   2. Hypotension- patient has autonomic dysfunction secondary paraplegia, currently BP in 70s, asymptomatic.  - Patient was started on Midodrine 5mg TID, no significant improvement on BP - chest x-ray revealed no pulmonary edema, I have discontinued Lasix   3. Chronic pain- we'll continue oxycodone IR 15 mg 4 times a day per his outpatient schedule with Dilaudid PRN for breakthrough pain. Patient has high tolerance to opiates, continued to demand for more and more narcotics. Explained to the patient that with his current BP levels, unable to give him any more IV Dilaudid.   4. Anemia- secondary to acute blood loss anemia, received 3 units of PRBC through hospitalization  5. CHF exacerbation- patient has a history of congestive heart failure with EF 15%, consulted cardiology for management of shortness of breath.   6. Sinus tachycardia- Improved. heart rate is up due to holding off Coreg secondary to rebound tachycardia, BB was restarted  7. Decubitus ulcer- wound care consulted and Dr. Kelly Splinter following   8. Moderate malnutrition- patient has lost over 50 pounds since last year, continue Ensure supplements.   DVT Prophylaxis:  Code Status:  Family Communication: Patient is alert and oriented, discussed with patient.   Disposition: TBD    Subjective: Complaining of pain, states that the current pain medications needs to be uptitrated.   Objective: Weight change:   Intake/Output Summary (Last 24 hours) at 11/18/13 1446 Last  data filed at 11/18/13 0451  Gross per 24 hour  Intake    240 ml  Output    900 ml  Net   -660 ml   Blood pressure 75/46, pulse 96, temperature 99.1 F (37.3 C), temperature source Oral, resp. rate 19, height 6\' 2"  (1.88 m), weight 66.2 kg (145 lb 15.1 oz), SpO2 100.00%.  Physical Exam: General: Alert and awake, oriented x3, not in any acute distress. CVS: S1-S2 clear, no murmur rubs or gallops Chest: CTAB, no rhonchi Abdomen: soft nontender, nondistended, normal bowel sounds  Extremities: no cyanosis, clubbing or edema noted bilaterally Neuro: Paraplegic  Lab Results: Basic Metabolic Panel:  Recent Labs Lab 11/13/13 0620 11/15/13 0422  NA 132* 132*  K 4.6 5.0  CL 100 99  CO2 17* 17*  GLUCOSE 146* 141*  BUN 12 18  CREATININE 0.64 0.88  CALCIUM 7.6* 8.0*  MG  --  2.1   Liver Function Tests: No results found for this basename: AST, ALT, ALKPHOS, BILITOT, PROT, ALBUMIN,  in the last 168 hours No results found for this basename: LIPASE, AMYLASE,  in the last 168 hours No results found for this basename: AMMONIA,  in the last 168 hours CBC:  Recent Labs Lab 11/13/13 0620 11/15/13 0422  WBC 9.9 13.1*  NEUTROABS  --  10.7*  HGB 7.5* 8.9*  HCT 23.8* 27.4*  MCV 82.6 83.3  PLT 382 361   Cardiac Enzymes:  Recent Labs Lab 11/13/13 0630  TROPONINI <0.30   BNP: No components found with this basename: POCBNP,  CBG:  Recent Labs Lab 11/16/13 0802 11/17/13 0751  GLUCAP  85 79     Micro Results: Recent Results (from the past 240 hour(s))  URINE CULTURE     Status: None   Collection Time    11/13/13 12:03 AM      Result Value Range Status   Specimen Description URINE, CLEAN CATCH   Final   Special Requests NONE   Final   Culture  Setup Time     Final   Value: 11/13/2013 04:46     Performed at [facility]   [place] Count     Final   Value: >=100,000 COLONIES/ML     Performed at [facility]   Culture     Final   [facility]: [facility]     Performed at [facility]   Report Status 11/14/2013 FINAL   Final   Organism ID, Bacteria MORGANELLA MORGANII   Final    Studies/Results: Dg Chest 2 View  11/13/2013   CLINICAL DATA:  Shortness of breath, coronary artery disease.  EXAM: CHEST  2 VIEW  COMPARISON:  November 12, 2013, March 13, 2013  FINDINGS: The heart size and mediastinal contours are stable. . There is no focal infiltrate, pulmonary edema, or pleural effusion. The visualized skeletal structures are stable.  IMPRESSION: No active cardiopulmonary disease.   Electronically Signed   By: Sherian ReinWei-Chen  Lin M.D.   On: 11/13/2013 10:36   Dg Chest 2 View  11/12/2013   CLINICAL DATA:  Hematuria.  Degeneration.  EXAM: CHEST  2 VIEW  COMPARISON:  03/13/2013 and 01/30/2013  FINDINGS: The heart size and mediastinal contours are within normal limits. Both lungs are clear. The visualized skeletal structures are unremarkable.  IMPRESSION: No active cardiopulmonary disease.   Electronically Signed   By: Myles RosenthalJohn  Stahl M.D.   On: 11/12/2013 16:21   Dg Chest Port 1 View  11/14/2013   CLINICAL DATA:  Shortness of breath  EXAM: PORTABLE CHEST - 1 VIEW  COMPARISON:  12/03/2013  FINDINGS: Stable cardiomegaly.  Lungs clear.  . No effusion. Visualized skeletal structures are unremarkable.  IMPRESSION: No acute cardiopulmonary disease.  Stable cardiomegaly.   Electronically Signed   By: Oley Balmaniel  Hassell M.D.   On: 11/14/2013 20:13   Dg Abd Portable 1v  11/12/2013   CLINICAL DATA:  Hematuria and dehydration  EXAM: PORTABLE ABDOMEN - 1 VIEW  COMPARISON:  Abdominal CT 03/16/2013  FINDINGS: There is a left lower quadrant colostomy. Diffuse formed stool throughout the colon. No small bowel obstruction.  Chronic osteomyelitis (with sclerosis, osteolysis, and chronic periosteal reaction) of the left hip and pelvis with visible, large decubitus ulcer. 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 Appearance is grossly stable from previous CT.  Clear  lung bases.  IMPRESSION: 1. Probable constipation. 2. Chronic osteomyelitis of the left hip and pelvis from large decubitus ulcer.   Electronically Signed   By: Tiburcio PeaJonathan  Watts M.D.   On: 11/12/2013 22:43    Medications: Scheduled Meds: . carvedilol  3.125 mg Oral BID  . cefTRIAXone (ROCEPHIN)  IV  1 g Intravenous Q24H  . ENSURE PLUS  237 mL Oral BID BM  . ferrous fumarate  1 tablet Oral TID  . midodrine  5 mg Oral TID WC  . multivitamin with minerals  1 tablet Oral Daily  . nutrition supplement (JUVEN)  1 packet Oral BID BM  . oxybutynin  5 mg Oral TID  . oxyCODONE  15 mg Oral Q4H  . pantoprazole  40 mg Oral BID  .  simvastatin  40 mg Oral QHS  . vitamin C  500 mg Oral Daily  . zinc sulfate  220 mg Oral Daily      LOS: 6 days   RAI,RIPUDEEP M.D. [facility] 11/18/2013, 2:46 PM Pager: [phone]  If 7PM-7AM, please contact night-coverage [email or web address] Password [email or web address]

## 2013-11-19 DIAGNOSIS — Z1619 Resistance to other specified beta lactam antibiotics: Secondary | ICD-10-CM

## 2013-11-19 DIAGNOSIS — B9689 Other specified bacterial agents as the cause of diseases classified elsewhere: Secondary | ICD-10-CM

## 2013-11-19 MED ORDER — ENSURE PLUS PO LIQD
237.0000 mL | Freq: Two times a day (BID) | ORAL | Status: DC
  Administered 2013-11-20 – 2013-11-21 (×2): 237 mL via ORAL
  Filled 2013-11-19 (×6): qty 237

## 2013-11-19 NOTE — Progress Notes (Signed)
Asked pt if he had the necessary supplies for his dressing change (pt refuses to let staff touch his dressing, pt insists on doing dressing changes himself). Pt asked for kerlix and ABD pads, which I provided him with. The pt then asked for me to move the dressing supplies off his bedside table, that he would ask for them when he was ready for the dressing change. Pt does not want to change his dressing now. Will reassess at a later time.
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
## 2013-11-20 LAB — BASIC METABOLIC PANEL
BUN: 12 mg/dL (ref 6–23)
CO2: 20 mEq/L (ref 19–32)
Calcium: 7.5 mg/dL — ABNORMAL LOW (ref 8.4–10.5)
Chloride: 102 mEq/L (ref 96–112)
Creatinine, Ser: 0.59 mg/dL (ref 0.50–1.35)
GLUCOSE: 113 mg/dL — AB (ref 70–99)
POTASSIUM: 4.3 meq/L (ref 3.7–5.3)
SODIUM: 134 meq/L — AB (ref 137–147)

## 2013-11-20 LAB — CBC
HEMATOCRIT: 27.1 % — AB (ref 39.0–52.0)
HEMOGLOBIN: 8.6 g/dL — AB (ref 13.0–17.0)
MCH: 26.6 pg (ref 26.0–34.0)
MCHC: 31.7 g/dL (ref 30.0–36.0)
MCV: 83.9 fL (ref 78.0–100.0)
Platelets: 306 10*3/uL (ref 150–400)
RBC: 3.23 MIL/uL — ABNORMAL LOW (ref 4.22–5.81)
RDW: 18.6 % — AB (ref 11.5–15.5)
WBC: 8.8 10*3/uL (ref 4.0–10.5)

## 2013-11-20 MED ORDER — PANTOPRAZOLE SODIUM 40 MG PO TBEC: 40.0000 mg | DELAYED_RELEASE_TABLET | Freq: Two times a day (BID) | ORAL | Status: DC

## 2013-11-20 MED ORDER — COLLAGENASE 250 UNIT/GM EX OINT
TOPICAL_OINTMENT | Freq: Every day | CUTANEOUS | Status: DC
  Administered 2013-11-20: 1 via TOPICAL
  Filled 2013-11-20: qty 30

## 2013-11-20 MED ORDER — JUVEN PO PACK: 1.0000 | PACK | Freq: Two times a day (BID) | ORAL | Status: DC

## 2013-11-20 MED ORDER — ADULT MULTIVITAMIN W/MINERALS CH: 1.0000 | ORAL_TABLET | Freq: Every day | ORAL | Status: AC

## 2013-11-20 MED ORDER — MIDODRINE HCL 5 MG PO TABS: 5.0000 mg | ORAL_TABLET | Freq: Three times a day (TID) | ORAL | Status: DC

## 2013-11-20 MED ORDER — COLLAGENASE 250 UNIT/GM EX OINT: TOPICAL_OINTMENT | Freq: Every day | CUTANEOUS | Status: DC

## 2013-11-20 MED ORDER — ASCORBIC ACID 500 MG PO TABS: 500.0000 mg | ORAL_TABLET | Freq: Every day | ORAL | Status: AC

## 2013-11-20 MED ORDER — ZINC SULFATE 220 (50 ZN) MG PO CAPS: 220.0000 mg | ORAL_CAPSULE | Freq: Every day | ORAL | Status: AC
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
## 2013-11-20 NOTE — Consult Note (Signed)
WOC wound consult note Reason for Consult: evaluation of bilateral heel wounds.  Pt with well known long history of pressure ulcers.  WOC team familiar with this patient.  We have most recently recommend plastics evaluation for his sacrum and see her notes. 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 Kelly Splinter notes, he will possible flap surgery noted however in conversation with patient he has routine follow up with Dr. Leodis Binet at [facility] wound care center.  The new issue is the need to possible create possible urinary diversion (see urology notes).   Wound type:  Bilateral Stage III Pressure ulcers  R: 1.5cm x 3.0cm x 0.2cm (heel) L:  1.0cm x 0.2cm x 0.1cm (heel but plantar surface) L malleolus: 4cm x 3cm x 0.5cm  Pressure Ulcer POA: Yes x 4 (see admission notes and WOC addendum) Measurement:see above Wound bed: R heel: mostly granulation tissue, small area <10 % yellow slough in central wound bed L heel/plantar surface: small opening, pink, moist almost healed L malleolus: mostly pink, granulation tissue, moist with exposed tendon Drainage (amount, consistency, odor) minimal at each site, none on left heel Periwound:right heel and left plantar wound periwound is considerably macerated, probally because moist gauze dressings used and if not contained to the wound bed it will macerate the periwound  Dressing procedure/placement/frequency: Change right heel to Santyl to debride the slough, cover with dry dressing. Secure with kerlix/tape. Add Prevalon boot bilaterally.  Change left ankle and left heel: hydrogel to maintain moisture for the ankle due to exposed tendon and to lessen the maceration of the periwound tissue.    Please note discharge wound care: 1. Continue normal saline dressings for the sacrum per Dr. Kelly Splinter, would suggest contacting [facility] wound care center to see when his follow up is scheduled since he is a current patient  there 2.  Right heel: Santyl applied 1/4" layer daily, cover with dry dressing, secure with tape, Prevalon boot at all times 3. Left heel: hydrogel to open area of the plantar surface, cover with dry dressing. Change daily 4.  Left ankle wound: hydrogel daily, cover with dry dressing, secure with tape or kerlix.   Discussed POC with patient and bedside nurse.  Re consult if needed, will not follow at this time. Thanks  Ladamien Rammel [employer], CWOCN [phone])
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
## 2013-11-20 NOTE — Plan of Care (Signed)
Problem: Phase II Progression Outcomes Goal: Obtain order to discontinue catheter if appropriate Outcome: Adequate for Discharge Will be discharged with foley catheter.  Problem: Phase III Progression Outcomes Goal: Foley discontinued Outcome: Adequate for Discharge Will be discharged with foley catheter.

## 2013-11-21 MED ORDER — OXYCODONE HCL 10 MG PO TABS: 15.0000 mg | ORAL_TABLET | ORAL | Status: DC | PRN

## 2013-11-21 MED ORDER — LORAZEPAM 0.5 MG PO TABS: 0.5000 mg | ORAL_TABLET | Freq: Three times a day (TID) | ORAL | Status: AC | PRN

## 2013-11-21 NOTE — Progress Notes (Signed)
Pt requested IV pain and anxiety medication prior to his sacral dressing change. When I went to administer medication, pt's IV was leaking. Old IV was removed, and 2 RN's attempted to place new IV. Both RN's unsuccessful. Pt's mom arrived around this time to take him home. Pt opted to have the dressing changed without medication prior, since pt was informed that IV team would likely take a while.
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
## 2013-11-21 NOTE — Progress Notes (Signed)
Patient refused dressing change on sacral wound and stated he would do dressing change during morning shift. Will pass information to morning RN.

## 2013-11-21 NOTE — Progress Notes (Signed)
OT Cancellation Note  Patient Details Name: [name] [name] MRN: [id number] DOB: [birth year]   Cancelled Treatment:    Reason Eval/Treat Not Completed: Patient declined, stating that he was leaving to day and doesn't need therapy. Pt scheduled to d/c home today  Galen [place], [place] 11/21/2013, 12:45 PM

## 2013-11-21 NOTE — Progress Notes (Signed)
Discussed discharge instructions and medications with pt. Pt showed no barriers to discharge. IV removed. Pt discharged to home with family. Assessment unchanged from morning. Foley bag switched to leg bag. Pt instructed on how to empty bag. Pt set up with [facility] for home health RN & aide.

## 2013-11-21 NOTE — Discharge Instructions (Signed)
Follow with Primary MD [name], MD in 7 days   Get CBC, CMP, checked 7 days by Primary MD and again as instructed by your Primary MD.     Activity: As tolerated with Full fall precautions use walker/cane & assistance as needed   Disposition Home    Diet: Heart Healthy   Check your Weight same time everyday, if you gain over 2 pounds, or you develop in leg swelling, experience more shortness of breath or chest pain, call your Primary MD immediately. Follow Cardiac Low Salt Diet and 1.8 lit/day fluid restriction.   On your next visit with her primary care physician please Get Medicines reviewed and adjusted.  Please request your Prim.MD to go over all Hospital Tests and Procedure/Radiological results at the follow up, please get all Hospital records sent to your Prim MD by signing hospital release before you go home.   If you experience worsening of your admission symptoms, develop shortness of breath, life threatening emergency, suicidal or homicidal thoughts you must seek medical attention immediately by calling 911 or calling your MD immediately  if symptoms less severe.  You Must read complete instructions/literature along with all the possible adverse reactions/side effects for all the Medicines you take and that have been prescribed to you. Take any new Medicines after you have completely understood and accpet all the possible adverse reactions/side effects.   Do not drive and provide baby sitting services if your were admitted for syncope or siezures until you have seen by Primary MD or a Neurologist and advised to do so again.  Do not drive when taking Pain medications.    Do not take more than prescribed Pain, Sleep and Anxiety Medications  Special Instructions: If you have smoked or chewed Tobacco  in the last 2 yrs please stop smoking, stop any regular Alcohol  and or any Recreational drug use.  Wear Seat belts while driving.   Please note  You were cared for by a  hospitalist during your hospital stay. If you have any questions about your discharge medications or the care you received while you were in the hospital after you are discharged, you can call the unit and asked to speak with the hospitalist on call if the hospitalist that took care of you is not available. Once you are discharged, your primary care physician will handle any further medical issues. Please note that NO REFILLS for any discharge medications will be authorized once you are discharged, as it is imperative that you return to your primary care physician (or establish a relationship with a primary care physician if you do not have one) for your aftercare needs so that they can reassess your need for medications and monitor your lab values.

## 2013-11-21 NOTE — Progress Notes (Signed)
[facility]                                                                                Patient Demographics  [name] [name], is a 38 y.o. male  ZOX:[id number]  [id number]  DOB - 05/18/1976  Admit date - 11/12/2013  Admitting Physician Dorothea OgleIskra M Myers, MD  Outpatient Primary MD for the patient is [name], [name], MD  LOS - 9   Chief Complaint  Patient presents with  . Hematuria  . Dehydration        Assessment & Plan    Patient seen briefly today due for discharge soon per Discharge done yesterday by Dr Blake DivineAkula, no further issues, Vital signs stable, patient feels fine.    DC instructions  Follow with Primary MD [name], [name], MD in 7 days   Get CBC, CMP, checked 7 days by Primary MD and again as instructed by your Primary MD.     Activity: As tolerated with Full fall precautions use walker/cane & assistance as needed   Disposition Home    Diet: Heart Healthy   Check your Weight same time everyday, if you gain over 2 pounds, or you develop in leg swelling, experience more shortness of breath or chest pain, call your Primary MD immediately. Follow Cardiac Low Salt Diet and 1.8 lit/day fluid restriction.   On your next visit with her primary care physician please Get Medicines reviewed and adjusted.  Please request your Prim.MD to go over all Hospital Tests and Procedure/Radiological results at the follow up, please get all Hospital records sent to your Prim MD by signing hospital release before you go home.   If you experience worsening of your admission symptoms, develop shortness of breath, life threatening emergency, suicidal or homicidal thoughts you must seek medical attention immediately by calling 911 or calling your MD immediately  if symptoms less severe.  You Must read complete instructions/literature along with all the possible adverse reactions/side effects for all the Medicines you take and that have been prescribed to you. Take any  new Medicines after you have completely understood and accpet all the possible adverse reactions/side effects.   Do not drive and provide baby sitting services if your were admitted for syncope or siezures until you have seen by Primary MD or a Neurologist and advised to do so again.  Do not drive when taking Pain medications.    Do not take more than prescribed Pain, Sleep and Anxiety Medications  Special Instructions: If you have smoked or chewed Tobacco  in the last 2 yrs please stop smoking, stop any regular Alcohol  and or any Recreational drug use.  Wear Seat belts while driving.   Please note  You were cared for by a hospitalist during your hospital stay. If you have any questions about your discharge medications or the care you received while you were in the hospital after you are discharged, you can call the unit and asked to speak with the hospitalist on call if the hospitalist that took care of you is not available. Once you are discharged, your primary care physician will handle any further medical issues. Please note that NO REFILLS for any discharge  medications will be authorized once you are discharged, as it is imperative that you return to your primary care physician (or establish a relationship with a primary care physician if you do not have one) for your aftercare needs so that they can reassess your need for medications and monitor your lab values.    Follow-up Information   Follow up with Milford Cage, MD In 4 weeks. (Cath change in 4 weeks from 11/12/13.)    Specialty:  Urology   Contact information:   [address] [facility]  [place] [address] [phone]       Follow up with [name], MD. Schedule an appointment as soon as possible for a visit in 1 week.   Specialty:  Family Medicine   Contact information:   [address] [place] [address] [phone]       Follow up with [facility], DO.  Schedule an appointment as soon as possible for a visit in 1 week. (or your Occupational hygienist)    Specialty:  Plastic Surgery   Contact information:   [address] [place] [address] [phone]       Medications  Scheduled Meds: . carvedilol  3.125 mg Oral BID  . cefTRIAXone (ROCEPHIN)  IV  1 g Intravenous Q24H  . collagenase   Topical Daily  . ENSURE PLUS  237 mL Oral BID BM  . ferrous fumarate  1 tablet Oral TID  . midodrine  5 mg Oral TID WC  . multivitamin with minerals  1 tablet Oral Daily  . nutrition supplement (JUVEN)  1 packet Oral BID BM  . oxybutynin  5 mg Oral TID  . oxyCODONE  15 mg Oral Q4H  . pantoprazole  40 mg Oral BID  . simvastatin  40 mg Oral QHS  . vitamin C  500 mg Oral Daily  . zinc sulfate  220 mg Oral Daily   Continuous Infusions: . sodium chloride 10 mL/hr at 11/14/13 0300   PRN Meds:.alum & mag hydroxide-simeth, HYDROmorphone (DILAUDID) injection, LORazepam, mirtazapine, ondansetron    Time Spent in minutes   10 minutes   Susa Raring K M.D on 11/21/2013 at 10:16 AM  Between 7am to 7pm - Pager - [phone]  After 7pm go to [email or web address] - password [email or web address]  And look for the night coverage person covering for me after hours  [facility] Office  [phone]    Subjective:   [name] [name] today has, No headache, No chest pain, No abdominal pain - No Nausea, No new weakness tingling or numbness, No Cough - SOB.    Objective:   Filed Vitals:   11/20/13 2057 11/20/13 2234 11/21/13 0531 11/21/13 1000  BP: 88/61 79/51 84/56  87/56  Pulse: 112 115 113 108  Temp: 98.4 F (36.9 C)  98.6 F (37 C) 98.2 F (36.8 C)  TempSrc: Oral  Oral Oral  Resp: 18  18 16   Height:      Weight: 66.197 kg (145 lb 15 oz)     SpO2: 100%  96% 98%    Wt Readings from Last 3 Encounters:  11/20/13 66.197 kg (145 lb 15 oz)  07/14/13 72.576 kg (160 lb)  03/14/13 66.4 kg (146 lb 6.2 oz)     Intake/Output Summary (Last 24  hours) at 11/21/13 1016 Last data filed at 11/21/13 0532  Gross per 24 hour  Intake   1080 ml  Output    600 ml  Net  480 ml    Exam Awake Alert, Oriented X 3, No new F.N deficits, Normal affect [place].AT,PERRAL Supple Neck,No JVD, No cervical lymphadenopathy appriciated.  Symmetrical Chest wall movement, Good air movement bilaterally, CTAB RRR,No Gallops,Rubs or new Murmurs, No Parasternal Heave +ve B.Sounds, Abd Soft, Non tender, No organomegaly appriciated, No rebound - guarding or rigidity. No Cyanosis, Clubbing or edema, No new Rash or bruise     Data Review

## 2013-11-21 NOTE — Care Management Note (Signed)
   CARE MANAGEMENT NOTE 11/21/2013  Patient:  [name] [name], [name] [name]   Account Number:  [id number]  Date Initiated:  11/13/2013  Documentation initiated by:  Darlyne Russian  Subjective/Objective Assessment:   admitted with hematuria, SOB, Hgb 6.8  medical hx: paraplegia (age 38) colostomy, stage IV sacral wound  active with [facility] RN visits     Action/Plan:   resumption of home health  11/21/2012 Pt active with [facility], order for [facility], aide, PT and OT given to [facility], pt not eligible for HHPT and OT as these are not covered by Medicaid.   Anticipated DC Date:  11/21/2013   Anticipated DC Plan:  HOME W HOME HEALTH SERVICES         [facility] Choice  Resumption Of Svcs/PTA Provider   Choice offered to / List presented to:             Status of service:  Completed, signed off Medicare Important Message given?   (If response is "NO", the following Medicare IM given date fields will be blank) Date Medicare IM given:   Date Additional Medicare IM given:    Discharge Disposition:  HOME W HOME HEALTH SERVICES  Per UR Regulation:    If discussed at Long Length of Stay Meetings, dates discussed:     Comments:  11/13/2013  [address] RN, [place]  [phone]  [facility]/ Lupita Leash   patient is active for RN

## 2013-11-23 ENCOUNTER — Inpatient Hospital Stay (HOSPITAL_COMMUNITY)
Admission: EM | Admit: 2013-11-23 | Discharge: 2013-12-11 | DRG: 871 | Disposition: A | Discharge status: Home Health | Payer: Medicaid Other | Attending: Internal Medicine | Admitting: Internal Medicine

## 2013-11-23 ENCOUNTER — Encounter (HOSPITAL_COMMUNITY): Payer: Self-pay | Admitting: Emergency Medicine

## 2013-11-23 DIAGNOSIS — D649 Anemia, unspecified: Secondary | ICD-10-CM | POA: Diagnosis present

## 2013-11-23 DIAGNOSIS — Z79899 Other long term (current) drug therapy: Secondary | ICD-10-CM

## 2013-11-23 DIAGNOSIS — I313 Pericardial effusion (noninflammatory): Secondary | ICD-10-CM | POA: Diagnosis present

## 2013-11-23 DIAGNOSIS — D638 Anemia in other chronic diseases classified elsewhere: Secondary | ICD-10-CM | POA: Diagnosis present

## 2013-11-23 DIAGNOSIS — I509 Heart failure, unspecified: Secondary | ICD-10-CM | POA: Diagnosis present

## 2013-11-23 DIAGNOSIS — Z95810 Presence of automatic (implantable) cardiac defibrillator: Secondary | ICD-10-CM

## 2013-11-23 DIAGNOSIS — R509 Fever, unspecified: Secondary | ICD-10-CM

## 2013-11-23 DIAGNOSIS — M949 Disorder of cartilage, unspecified: Secondary | ICD-10-CM

## 2013-11-23 DIAGNOSIS — A419 Sepsis, unspecified organism: Secondary | ICD-10-CM | POA: Diagnosis present

## 2013-11-23 DIAGNOSIS — N390 Urinary tract infection, site not specified: Secondary | ICD-10-CM

## 2013-11-23 DIAGNOSIS — M86659 Other chronic osteomyelitis, unspecified thigh: Secondary | ICD-10-CM | POA: Diagnosis present

## 2013-11-23 DIAGNOSIS — Z87440 Personal history of urinary (tract) infections: Secondary | ICD-10-CM

## 2013-11-23 DIAGNOSIS — T827XXA Infection and inflammatory reaction due to other cardiac and vascular devices, implants and grafts, initial encounter: Secondary | ICD-10-CM | POA: Diagnosis present

## 2013-11-23 DIAGNOSIS — I959 Hypotension, unspecified: Secondary | ICD-10-CM | POA: Diagnosis present

## 2013-11-23 DIAGNOSIS — Z91199 Patient's noncompliance with other medical treatment and regimen due to unspecified reason: Secondary | ICD-10-CM

## 2013-11-23 DIAGNOSIS — L8994 Pressure ulcer of unspecified site, stage 4: Secondary | ICD-10-CM | POA: Diagnosis present

## 2013-11-23 DIAGNOSIS — A0472 Enterocolitis due to Clostridium difficile, not specified as recurrent: Secondary | ICD-10-CM | POA: Diagnosis present

## 2013-11-23 DIAGNOSIS — I5022 Chronic systolic (congestive) heart failure: Secondary | ICD-10-CM

## 2013-11-23 DIAGNOSIS — M899 Disorder of bone, unspecified: Secondary | ICD-10-CM | POA: Diagnosis present

## 2013-11-23 DIAGNOSIS — K922 Gastrointestinal hemorrhage, unspecified: Secondary | ICD-10-CM

## 2013-11-23 DIAGNOSIS — K4090 Unilateral inguinal hernia, without obstruction or gangrene, not specified as recurrent: Secondary | ICD-10-CM

## 2013-11-23 DIAGNOSIS — M009 Pyogenic arthritis, unspecified: Secondary | ICD-10-CM

## 2013-11-23 DIAGNOSIS — I2510 Atherosclerotic heart disease of native coronary artery without angina pectoris: Secondary | ICD-10-CM | POA: Diagnosis present

## 2013-11-23 DIAGNOSIS — M545 Low back pain, unspecified: Secondary | ICD-10-CM | POA: Diagnosis present

## 2013-11-23 DIAGNOSIS — I10 Essential (primary) hypertension: Secondary | ICD-10-CM | POA: Diagnosis present

## 2013-11-23 DIAGNOSIS — R079 Chest pain, unspecified: Secondary | ICD-10-CM | POA: Diagnosis present

## 2013-11-23 DIAGNOSIS — B9689 Other specified bacterial agents as the cause of diseases classified elsewhere: Secondary | ICD-10-CM | POA: Diagnosis present

## 2013-11-23 DIAGNOSIS — R5381 Other malaise: Secondary | ICD-10-CM | POA: Diagnosis present

## 2013-11-23 DIAGNOSIS — M869 Osteomyelitis, unspecified: Secondary | ICD-10-CM

## 2013-11-23 DIAGNOSIS — G894 Chronic pain syndrome: Secondary | ICD-10-CM | POA: Diagnosis present

## 2013-11-23 DIAGNOSIS — E860 Dehydration: Secondary | ICD-10-CM

## 2013-11-23 DIAGNOSIS — R6510 Systemic inflammatory response syndrome (SIRS) of non-infectious origin without acute organ dysfunction: Secondary | ICD-10-CM

## 2013-11-23 DIAGNOSIS — R531 Weakness: Secondary | ICD-10-CM | POA: Diagnosis present

## 2013-11-23 DIAGNOSIS — L8990 Pressure ulcer of unspecified site, unspecified stage: Secondary | ICD-10-CM | POA: Diagnosis present

## 2013-11-23 DIAGNOSIS — Z1612 Extended spectrum beta lactamase (ESBL) resistance: Secondary | ICD-10-CM

## 2013-11-23 DIAGNOSIS — R7881 Bacteremia: Secondary | ICD-10-CM | POA: Diagnosis present

## 2013-11-23 DIAGNOSIS — G8220 Paraplegia, unspecified: Secondary | ICD-10-CM

## 2013-11-23 DIAGNOSIS — A4101 Sepsis due to Methicillin susceptible Staphylococcus aureus: Secondary | ICD-10-CM

## 2013-11-23 DIAGNOSIS — Z515 Encounter for palliative care: Secondary | ICD-10-CM

## 2013-11-23 DIAGNOSIS — E43 Unspecified severe protein-calorie malnutrition: Secondary | ICD-10-CM | POA: Diagnosis present

## 2013-11-23 DIAGNOSIS — Z9861 Coronary angioplasty status: Secondary | ICD-10-CM

## 2013-11-23 DIAGNOSIS — L89154 Pressure ulcer of sacral region, stage 4: Secondary | ICD-10-CM | POA: Diagnosis present

## 2013-11-23 DIAGNOSIS — Y846 Urinary catheterization as the cause of abnormal reaction of the patient, or of later complication, without mention of misadventure at the time of the procedure: Secondary | ICD-10-CM | POA: Diagnosis present

## 2013-11-23 DIAGNOSIS — A499 Bacterial infection, unspecified: Secondary | ICD-10-CM

## 2013-11-23 DIAGNOSIS — R012 Other cardiac sounds: Secondary | ICD-10-CM | POA: Diagnosis not present

## 2013-11-23 DIAGNOSIS — H409 Unspecified glaucoma: Secondary | ICD-10-CM | POA: Diagnosis present

## 2013-11-23 DIAGNOSIS — N179 Acute kidney failure, unspecified: Secondary | ICD-10-CM

## 2013-11-23 DIAGNOSIS — D509 Iron deficiency anemia, unspecified: Secondary | ICD-10-CM

## 2013-11-23 DIAGNOSIS — F1210 Cannabis abuse, uncomplicated: Secondary | ICD-10-CM | POA: Diagnosis present

## 2013-11-23 DIAGNOSIS — L89109 Pressure ulcer of unspecified part of back, unspecified stage: Secondary | ICD-10-CM | POA: Diagnosis present

## 2013-11-23 DIAGNOSIS — Z933 Colostomy status: Secondary | ICD-10-CM

## 2013-11-23 DIAGNOSIS — D62 Acute posthemorrhagic anemia: Secondary | ICD-10-CM

## 2013-11-23 DIAGNOSIS — L97429 Non-pressure chronic ulcer of left heel and midfoot with unspecified severity: Secondary | ICD-10-CM

## 2013-11-23 DIAGNOSIS — Z9119 Patient's noncompliance with other medical treatment and regimen: Secondary | ICD-10-CM

## 2013-11-23 DIAGNOSIS — L89209 Pressure ulcer of unspecified hip, unspecified stage: Secondary | ICD-10-CM | POA: Diagnosis present

## 2013-11-23 DIAGNOSIS — F17200 Nicotine dependence, unspecified, uncomplicated: Secondary | ICD-10-CM | POA: Diagnosis present

## 2013-11-23 DIAGNOSIS — T83511A Infection and inflammatory reaction due to indwelling urethral catheter, initial encounter: Secondary | ICD-10-CM | POA: Diagnosis present

## 2013-11-23 DIAGNOSIS — I319 Disease of pericardium, unspecified: Secondary | ICD-10-CM | POA: Diagnosis present

## 2013-11-23 DIAGNOSIS — E871 Hypo-osmolality and hyponatremia: Secondary | ICD-10-CM | POA: Diagnosis present

## 2013-11-23 DIAGNOSIS — S300XXA Contusion of lower back and pelvis, initial encounter: Secondary | ICD-10-CM

## 2013-11-23 DIAGNOSIS — R64 Cachexia: Secondary | ICD-10-CM | POA: Diagnosis present

## 2013-11-23 DIAGNOSIS — N319 Neuromuscular dysfunction of bladder, unspecified: Secondary | ICD-10-CM | POA: Diagnosis present

## 2013-11-23 DIAGNOSIS — I3139 Other pericardial effusion (noninflammatory): Secondary | ICD-10-CM | POA: Diagnosis present

## 2013-11-23 DIAGNOSIS — I428 Other cardiomyopathies: Secondary | ICD-10-CM | POA: Diagnosis present

## 2013-11-23 DIAGNOSIS — M4628 Osteomyelitis of vertebra, sacral and sacrococcygeal region: Secondary | ICD-10-CM

## 2013-11-23 LAB — URINE MICROSCOPIC-ADD ON

## 2013-11-23 LAB — CBC WITH DIFFERENTIAL/PLATELET
Basophils Absolute: 0 10*3/uL (ref 0.0–0.1)
Basophils Relative: 0 % (ref 0–1)
Eosinophils Absolute: 0 10*3/uL (ref 0.0–0.7)
Eosinophils Relative: 0 % (ref 0–5)
HEMATOCRIT: 26.2 % — AB (ref 39.0–52.0)
Hemoglobin: 8.3 g/dL — ABNORMAL LOW (ref 13.0–17.0)
LYMPHS PCT: 10 % — AB (ref 12–46)
Lymphs Abs: 1.3 10*3/uL (ref 0.7–4.0)
MCH: 27.2 pg (ref 26.0–34.0)
MCHC: 31.7 g/dL (ref 30.0–36.0)
MCV: 85.9 fL (ref 78.0–100.0)
MONOS PCT: 4 % (ref 3–12)
Monocytes Absolute: 0.5 10*3/uL (ref 0.1–1.0)
NEUTROS ABS: 11.1 10*3/uL — AB (ref 1.7–7.7)
NEUTROS PCT: 86 % — AB (ref 43–77)
Platelets: 309 10*3/uL (ref 150–400)
RBC: 3.05 MIL/uL — ABNORMAL LOW (ref 4.22–5.81)
RDW: 19.2 % — AB (ref 11.5–15.5)
Smear Review: ADEQUATE
WBC: 12.9 10*3/uL — AB (ref 4.0–10.5)

## 2013-11-23 LAB — GRAM STAIN

## 2013-11-23 LAB — COMPREHENSIVE METABOLIC PANEL
ALT: 9 U/L (ref 0–53)
AST: 10 U/L (ref 0–37)
Albumin: 1.2 g/dL — ABNORMAL LOW (ref 3.5–5.2)
Alkaline Phosphatase: 213 U/L — ABNORMAL HIGH (ref 39–117)
BUN: 19 mg/dL (ref 6–23)
CO2: 19 mEq/L (ref 19–32)
Calcium: 7.8 mg/dL — ABNORMAL LOW (ref 8.4–10.5)
Chloride: 95 mEq/L — ABNORMAL LOW (ref 96–112)
Creatinine, Ser: 1.1 mg/dL (ref 0.50–1.35)
GFR calc Af Amer: >90 mL/min (ref >90)
GFR calc non Af Amer: 83 mL/min — ABNORMAL LOW (ref >90)
Glucose, Bld: 99 mg/dL (ref 70–99)
Potassium: 4.5 mEq/L (ref 3.7–5.3)
SODIUM: 128 meq/L — AB (ref 137–147)
TOTAL PROTEIN: 6.8 g/dL (ref 6.0–8.3)
Total Bilirubin: 1.1 mg/dL (ref 0.3–1.2)

## 2013-11-23 LAB — URINALYSIS, ROUTINE W REFLEX MICROSCOPIC
Glucose, UA: NEGATIVE mg/dL
Hgb urine dipstick: NEGATIVE
Ketones, ur: 15 mg/dL — AB
NITRITE: POSITIVE — AB
Protein, ur: 100 mg/dL — AB
Specific Gravity, Urine: 1.03 (ref 1.005–1.030)
UROBILINOGEN UA: 1 mg/dL (ref 0.0–1.0)
pH: 5 (ref 5.0–8.0)

## 2013-11-23 LAB — POCT I-STAT TROPONIN I: Troponin i, poc: 0.01 ng/mL (ref 0.00–0.08)

## 2013-11-23 MED ORDER — PANTOPRAZOLE SODIUM 40 MG PO TBEC
40.0000 mg | DELAYED_RELEASE_TABLET | Freq: Two times a day (BID) | ORAL | Status: DC
  Administered 2013-11-23 – 2013-12-10 (×33): 40 mg via ORAL
  Filled 2013-11-23 (×31): qty 1

## 2013-11-23 MED ORDER — DEXTROSE 5 % IV SOLN
1.0000 g | INTRAVENOUS | Status: DC
  Administered 2013-11-24 – 2013-11-27 (×4): 1 g via INTRAVENOUS
  Filled 2013-11-23 (×5): qty 10

## 2013-11-23 MED ORDER — COLLAGENASE 250 UNIT/GM EX OINT
TOPICAL_OINTMENT | Freq: Every day | CUTANEOUS | Status: DC
  Administered 2013-11-23 – 2013-12-02 (×10): via TOPICAL
  Administered 2013-12-03 – 2013-12-04 (×2): 2 via TOPICAL
  Administered 2013-12-05 – 2013-12-10 (×4): via TOPICAL
  Filled 2013-11-23 (×3): qty 30

## 2013-11-23 MED ORDER — ONDANSETRON HCL 4 MG/2ML IJ SOLN
4.0000 mg | Freq: Four times a day (QID) | INTRAMUSCULAR | Status: DC | PRN
  Administered 2013-11-29 – 2013-11-30 (×3): 4 mg via INTRAVENOUS
  Filled 2013-11-23 (×3): qty 2

## 2013-11-23 MED ORDER — ZINC SULFATE 220 (50 ZN) MG PO CAPS
220.0000 mg | ORAL_CAPSULE | Freq: Every day | ORAL | Status: DC
  Administered 2013-11-23 – 2013-12-06 (×8): 220 mg via ORAL
  Filled 2013-11-23 (×19): qty 1

## 2013-11-23 MED ORDER — SODIUM CHLORIDE 0.9 % IV SOLN: INTRAVENOUS | Status: AC

## 2013-11-23 MED ORDER — MIRTAZAPINE 30 MG PO TABS
30.0000 mg | ORAL_TABLET | Freq: Every day | ORAL | Status: DC
  Administered 2013-11-23 – 2013-12-09 (×16): 30 mg via ORAL
  Filled 2013-11-23 (×19): qty 1

## 2013-11-23 MED ORDER — OXYCODONE HCL 10 MG PO TABS: 15.0000 mg | ORAL_TABLET | ORAL | Status: DC | PRN

## 2013-11-23 MED ORDER — ONDANSETRON HCL 4 MG PO TABS: 4.0000 mg | ORAL_TABLET | Freq: Four times a day (QID) | ORAL | Status: DC | PRN

## 2013-11-23 MED ORDER — DEXTROSE 5 % IV SOLN
1.0000 g | Freq: Once | INTRAVENOUS | Status: AC
  Administered 2013-11-23: 1 g via INTRAVENOUS
  Filled 2013-11-23: qty 10

## 2013-11-23 MED ORDER — SIMVASTATIN 40 MG PO TABS
40.0000 mg | ORAL_TABLET | Freq: Every day | ORAL | Status: DC
  Administered 2013-11-23 – 2013-12-09 (×17): 40 mg via ORAL
  Filled 2013-11-23 (×19): qty 1

## 2013-11-23 MED ORDER — OXYCODONE HCL 5 MG PO TABS
15.0000 mg | ORAL_TABLET | ORAL | Status: DC | PRN
  Administered 2013-11-24 – 2013-12-11 (×49): 15 mg via ORAL
  Filled 2013-11-23 (×52): qty 3

## 2013-11-23 MED ORDER — SODIUM CHLORIDE 0.9 % IV BOLUS (SEPSIS)
500.0000 mL | Freq: Once | INTRAVENOUS | Status: AC
  Administered 2013-11-23: 500 mL via INTRAVENOUS

## 2013-11-23 MED ORDER — JUVEN PO PACK
1.0000 | PACK | Freq: Two times a day (BID) | ORAL | Status: DC
  Administered 2013-11-25: 1 via ORAL
  Filled 2013-11-23 (×12): qty 1

## 2013-11-23 MED ORDER — CARVEDILOL 3.125 MG PO TABS: 3.1250 mg | ORAL_TABLET | Freq: Two times a day (BID) | ORAL | Status: DC

## 2013-11-23 MED ORDER — ADULT MULTIVITAMIN W/MINERALS CH
1.0000 | ORAL_TABLET | Freq: Every day | ORAL | Status: DC
  Administered 2013-11-25 – 2013-11-29 (×4): 1 via ORAL
  Filled 2013-11-23 (×18): qty 1

## 2013-11-23 MED ORDER — VITAMIN C 500 MG PO TABS
500.0000 mg | ORAL_TABLET | Freq: Every day | ORAL | Status: DC
  Administered 2013-11-23 – 2013-12-07 (×10): 500 mg via ORAL
  Filled 2013-11-23 (×19): qty 1

## 2013-11-23 MED ORDER — OXYBUTYNIN CHLORIDE 5 MG PO TABS
5.0000 mg | ORAL_TABLET | Freq: Three times a day (TID) | ORAL | Status: DC
  Administered 2013-11-23 – 2013-12-10 (×52): 5 mg via ORAL
  Filled 2013-11-23 (×55): qty 1

## 2013-11-23 MED ORDER — FERROUS FUMARATE 325 (106 FE) MG PO TABS
1.0000 | ORAL_TABLET | Freq: Three times a day (TID) | ORAL | Status: DC
  Administered 2013-11-24 – 2013-12-10 (×43): 106 mg via ORAL
  Filled 2013-11-23 (×56): qty 1

## 2013-11-23 MED ORDER — ENOXAPARIN SODIUM 40 MG/0.4ML ~~LOC~~ SOLN
40.0000 mg | SUBCUTANEOUS | Status: DC
  Administered 2013-11-23 – 2013-12-08 (×8): 40 mg via SUBCUTANEOUS
  Filled 2013-11-23 (×19): qty 0.4

## 2013-11-23 MED ORDER — HYDROMORPHONE HCL PF 1 MG/ML IJ SOLN
1.0000 mg | INTRAMUSCULAR | Status: DC | PRN
  Administered 2013-11-24 – 2013-12-11 (×85): 1 mg via INTRAVENOUS
  Filled 2013-11-23 (×92): qty 1

## 2013-11-23 MED ORDER — ENSURE PLUS PO LIQD
237.0000 mL | Freq: Two times a day (BID) | ORAL | Status: DC
  Administered 2013-11-25 – 2013-11-26 (×3): 237 mL via ORAL
  Filled 2013-11-23 (×10): qty 237

## 2013-11-23 MED ORDER — LORAZEPAM 0.5 MG PO TABS
0.5000 mg | ORAL_TABLET | Freq: Three times a day (TID) | ORAL | Status: DC | PRN
  Administered 2013-11-24 – 2013-12-07 (×13): 0.5 mg via ORAL
  Filled 2013-11-23 (×13): qty 1

## 2013-11-23 MED ORDER — MIDODRINE HCL 5 MG PO TABS
5.0000 mg | ORAL_TABLET | Freq: Three times a day (TID) | ORAL | Status: DC
  Administered 2013-11-24 – 2013-12-10 (×48): 5 mg via ORAL
  Filled 2013-11-23 (×57): qty 1
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
## 2013-11-23 NOTE — ED Notes (Signed)
NS bolus not running fast by gravity, placed on pump and will not run at a fast ml/hr. MD made aware, no further orders received.

## 2013-11-23 NOTE — ED Notes (Signed)
Md at bedside attempting IV access with ultrasound

## 2013-11-23 NOTE — ED Notes (Signed)
Pt here from with c/o weakness and hypotension , pt is altered per his norm.
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
## 2013-11-23 NOTE — ED Notes (Signed)
Dr. Anitra Lauth made aware the blood clotted so we need more for specimens and that IV team was not able to get IV access. No further orders received at this time.

## 2013-11-23 NOTE — ED Notes (Signed)
NS bolus placed on pressure bag and will not run. Placed on a pump at 100 ml/hr and it runs.

## 2013-11-23 NOTE — ED Notes (Signed)
IV team paged and responded.

## 2013-11-23 NOTE — ED Notes (Signed)
Attempted 2 Ekgs , but had lots of artifact, notified MD of delay , EMT in room now to attempt Ekg

## 2013-11-23 NOTE — ED Notes (Signed)
Attempted Iv access times 2 without success ,2 nd RN to attempt Iv access  In room now

## 2013-11-23 NOTE — ED Notes (Signed)
IV team at the bedside. 

## 2013-11-24 DIAGNOSIS — IMO0002 Reserved for concepts with insufficient information to code with codable children: Secondary | ICD-10-CM

## 2013-11-24 DIAGNOSIS — G8220 Paraplegia, unspecified: Secondary | ICD-10-CM

## 2013-11-24 DIAGNOSIS — R509 Fever, unspecified: Secondary | ICD-10-CM

## 2013-11-24 LAB — BASIC METABOLIC PANEL
BUN: 54 mg/dL — AB (ref 6–23)
BUN: 18 mg/dL (ref 6–23)
CALCIUM: 7.8 mg/dL — AB (ref 8.4–10.5)
CHLORIDE: 107 meq/L (ref 96–112)
CHLORIDE: 100 meq/L (ref 96–112)
CO2: 19 meq/L (ref 19–32)
CO2: 19 meq/L (ref 19–32)
CREATININE: 4.01 mg/dL — AB (ref 0.50–1.35)
Calcium: 8.4 mg/dL (ref 8.4–10.5)
Creatinine, Ser: 0.69 mg/dL (ref 0.50–1.35)
GFR calc Af Amer: >90 mL/min (ref >90)
GFR calc non Af Amer: 17 mL/min — ABNORMAL LOW (ref >90)
GFR calc non Af Amer: >90 mL/min (ref >90)
GFR, EST AFRICAN AMERICAN: 20 mL/min — AB (ref >90)
GLUCOSE: 115 mg/dL — AB (ref 70–99)
Glucose, Bld: 133 mg/dL — ABNORMAL HIGH (ref 70–99)
Potassium: 5.2 mEq/L (ref 3.7–5.3)
Potassium: 4.3 mEq/L (ref 3.7–5.3)
Sodium: 139 mEq/L (ref 137–147)
Sodium: 132 mEq/L — ABNORMAL LOW (ref 137–147)

## 2013-11-24 LAB — CBC
HEMATOCRIT: 30.2 % — AB (ref 39.0–52.0)
Hemoglobin: 9.5 g/dL — ABNORMAL LOW (ref 13.0–17.0)
MCH: 27.1 pg (ref 26.0–34.0)
MCHC: 31.5 g/dL (ref 30.0–36.0)
MCV: 86 fL (ref 78.0–100.0)
Platelets: 328 10*3/uL (ref 150–400)
RBC: 3.51 MIL/uL — ABNORMAL LOW (ref 4.22–5.81)
RDW: 19.4 % — AB (ref 11.5–15.5)
WBC: 9.8 10*3/uL (ref 4.0–10.5)

## 2013-11-24 LAB — CK: Total CK: 12 U/L (ref 7–232)

## 2013-11-24 NOTE — Progress Notes (Signed)
[facility] PROGRESS NOTE  [name] [name] ZOX:[id number] DOB: 08/10/1976 DOA: 11/23/2013 PCP: [name], [name], MD  Assessment/Plan: Generalized weakness  - most likely secondary to progressive deconditioning and ? UTI  - empiric ABX, supportive care   UTI  - place on Rocephin for now and follow up on urine culture  -on 2/4 grew out Morganella sens to rocephin  Hyponatremia  - likely pre renal in etiology  - resolved  AKI  -Cr went from 1 to 4 overnight- will check BMP again for confirmation   Leukocytosis  - possibly secondary to UTI  - will follow up on urine culture  - continue empiric ABX for now  - CBC in AM   Anemia of chronic disease  - Hg and Hct stable and at pt's baseline  - CBC in AM   severe malnutrition  - Patient explains he lost over 50 pounds since last year, continue Ensure supplements  Pre-albumin <3  Urethral erosion  patient will likely require urinary diversion  - [facility] and wants to continue seeing him  Sacral decubitus ulcer  - Wound care consult   Hypertension  - Hold [place]ag as systolic blood pressure is in 81'X80's  - per old records, this seems to be patient's baseline  ?LTAC candidate    Code Status: full Family Communication: patient Disposition Plan: ? Would benefit from SNF but patient refused   Consultants:    Procedures:    Antibiotics:    HPI/Subjective: Still feeling very weak  Objective: Filed Vitals:   11/24/13 0610  BP: 78/35  Pulse: 105  Temp: 98.1 F (36.7 C)  Resp: 16    Intake/Output Summary (Last 24 hours) at 11/24/13 0934 Last data filed at 11/23/13 2214  Gross per 24 hour  Intake    240 ml  Output      0 ml  Net    240 ml   Filed Weights   11/23/13 2018  Weight: 69.899 kg (154 lb 1.6 oz)    Exam:   General:  A+Ox3, NAD- frail appearing  Cardiovascular: rrr  Respiratory: clear anterior  Abdomen: +Bs, soft  Foley full  Data  Reviewed: Basic Metabolic Panel:  Recent Labs Lab 11/20/13 1155 11/23/13 1710 11/24/13 0400  NA 134* 128* 139  K 4.3 4.5 5.2  CL 102 95* 107  CO2 20 19 19   GLUCOSE 113* 99 115*  BUN 12 19 54*  CREATININE 0.59 1.10 4.01*  CALCIUM 7.5* 7.8* 8.4   Liver Function Tests:  Recent Labs Lab 11/23/13 1710  AST 10  ALT 9  ALKPHOS 213*  BILITOT 1.1  PROT 6.8  ALBUMIN 1.2*   No results found for this basename: LIPASE, AMYLASE,  in the last 168 hours No results found for this basename: AMMONIA,  in the last 168 hours CBC:  Recent Labs Lab 11/20/13 1155 11/23/13 1710  WBC 8.8 12.9*  NEUTROABS  --  11.1*  HGB 8.6* 8.3*  HCT 27.1* 26.2*  MCV 83.9 85.9  PLT 306 309   Cardiac Enzymes: No results found for this basename: CKTOTAL, CKMB, CKMBINDEX, TROPONINI,  in the last 168 hours BNP (last 3 results)  Recent Labs  11/13/13 0630  PROBNP 15702.0*   CBG: No results found for this basename: GLUCAP,  in the last 168 hours  Recent Results (from the past 240 hour(s))  GRAM STAIN     Status: None   Collection Time    11/23/13  4:09 PM  Result Value Ref Range Status   Specimen Description URINE, RANDOM   Final   Special Requests NONE   Final   Gram Stain     Final   Value: CYTOSPIN PREP     WBC PRESENT,BOTH PMN AND MONONUCLEAR     GRAM POSITIVE COCCI IN CHAINS   Report Status 11/23/2013 FINAL   Final     Studies: No results found.  Scheduled Meds: . cefTRIAXone (ROCEPHIN)  IV  1 g Intravenous Q24H  . collagenase   Topical Daily  . enoxaparin (LOVENOX) injection  40 mg Subcutaneous Q24H  . ENSURE PLUS  237 mL Oral BID BM  . ferrous fumarate  1 tablet Oral TID  . midodrine  5 mg Oral TID WC  . mirtazapine  30 mg Oral QHS  . multivitamin with minerals  1 tablet Oral Daily  . nutrition supplement (JUVEN)  1 packet Oral BID BM  . oxybutynin  5 mg Oral TID  . pantoprazole  40 mg Oral BID  . simvastatin  40 mg Oral QHS  . vitamin C  500 mg Oral Daily  . zinc  sulfate  220 mg Oral Daily   Continuous Infusions:   Active Problems:   Weakness    Time spent: 35 min    Tocara Mennen  [facility] Pager [phone]. If 7PM-7AM, please contact night-coverage at [email or web address], password [facility] 11/24/2013, 9:34 AM  LOS: 1 day
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
## 2013-11-24 NOTE — Progress Notes (Signed)
Attempted to change sacral decub dressing per pt home routine. Pain medication provided per MD orders routinely throughout shift and prior to attempted dressing change. Pt insistent on doing dressing change himself and insistent that he can reach to dress wound as needed since he has been doing this "20 years.'. Multiple attempts to offer pt assistance. Noted copious amounts serosanguinous dranage. After 2 hours patient continued to express his frustration with RN's "impatience," however, finally allowed RN to complete task. Linens changed with NT assistance. PRN dilaudid repeated. Hortencia Conradi, RN

## 2013-11-25 ENCOUNTER — Inpatient Hospital Stay (HOSPITAL_COMMUNITY): Payer: Medicaid Other

## 2013-11-25 DIAGNOSIS — R5383 Other fatigue: Secondary | ICD-10-CM

## 2013-11-25 DIAGNOSIS — L89109 Pressure ulcer of unspecified part of back, unspecified stage: Secondary | ICD-10-CM

## 2013-11-25 DIAGNOSIS — R5381 Other malaise: Secondary | ICD-10-CM

## 2013-11-25 DIAGNOSIS — E43 Unspecified severe protein-calorie malnutrition: Secondary | ICD-10-CM

## 2013-11-25 DIAGNOSIS — L8994 Pressure ulcer of unspecified site, stage 4: Secondary | ICD-10-CM

## 2013-11-25 MED ORDER — DOCUSATE SODIUM 100 MG PO CAPS
100.0000 mg | ORAL_CAPSULE | Freq: Two times a day (BID) | ORAL | Status: DC
  Administered 2013-11-25 – 2013-11-29 (×10): 100 mg via ORAL
  Filled 2013-11-25 (×13): qty 1

## 2013-11-25 MED ORDER — SODIUM CHLORIDE 0.9 % IV SOLN
INTRAVENOUS | Status: DC
  Administered 2013-11-25: 250 mL via INTRAVENOUS
  Administered 2013-11-25: 50 mL/h via INTRAVENOUS
  Administered 2013-11-26: via INTRAVENOUS

## 2013-11-25 NOTE — Progress Notes (Signed)
[facility] PROGRESS NOTE  Alfonse RasLarry G Mallen ZDG:[id number] DOB: 02/02/1976 DOA: 11/23/2013 PCP: [name], [name], MD  Assessment/Plan: Generalized weakness  - most likely secondary to progressive deconditioning and ? UTI  As well as chronic wounds - empiric ABX, supportive care   UTI  - place on Rocephin for now and follow up on urine culture  -on 2/4 grew out Morganella sens to rocephin  Hyponatremia  - likely pre renal in etiology  - resolved  AKI  -lab error -Cr ok   Leukocytosis  - possibly secondary to UTI  - will follow up on urine culture  - continue empiric ABX for now  - CBC in AM   Anemia of chronic disease  - Hg and Hct stable and at pt's baseline  - CBC in AM   severe malnutrition  - Patient explains he lost over 50 pounds since last year, continue Ensure supplements  Pre-albumin <3 -doubt patient will ever heal his wounds due to poor nutritional status  Urethral erosion  patient will likely require urinary diversion  - Patient has [facility] surgeon [facility] and wants to continue seeing him  Sacral decubitus ulcer  - Wound care consult   Hypertension  - Hold Corag as systolic blood pressure is in 59'D80's  - per old records, this seems to be patient's baseline  ?LTAC candidate    Code Status: full Family Communication: patient Disposition Plan: ? Would benefit from SNF but patient refused- ? LTAC or patient will continue to come back to hospital   Consultants:    Procedures:    Antibiotics:    HPI/Subjective: C/o abd pain Stools are firm  Objective: Filed Vitals:   11/25/13 0840  BP: 82/55  Pulse: 118  Temp: 100.1 F (37.8 C)  Resp: 17    Intake/Output Summary (Last 24 hours) at 11/25/13 0956 Last data filed at 11/25/13 0700  Gross per 24 hour  Intake     50 ml  Output    950 ml  Net   -900 ml   Filed Weights   11/23/13 2018 11/24/13 2055  Weight: 69.899 kg (154 lb 1.6 oz) 68.13 kg (150 lb 3.2 oz)     Exam:   General:  A+Ox3, NAD- frail appearing  Cardiovascular: rrr  Respiratory: clear anterior  Abdomen: +Bs, soft   Data Reviewed: Basic Metabolic Panel:  Recent Labs Lab 11/20/13 1155 11/23/13 1710 11/24/13 0400 11/24/13 1135  NA 134* 128* 139 132*  K 4.3 4.5 5.2 4.3  CL 102 95* 107 100  CO2 20 19 19 19   GLUCOSE 113* 99 115* 133*  BUN 12 19 54* 18  CREATININE 0.59 1.10 4.01* 0.69  CALCIUM 7.5* 7.8* 8.4 7.8*   Liver Function Tests:  Recent Labs Lab 11/23/13 1710  AST 10  ALT 9  ALKPHOS 213*  BILITOT 1.1  PROT 6.8  ALBUMIN 1.2*   No results found for this basename: LIPASE, AMYLASE,  in the last 168 hours No results found for this basename: AMMONIA,  in the last 168 hours CBC:  Recent Labs Lab 11/20/13 1155 11/23/13 1710 11/24/13 1135  WBC 8.8 12.9* 9.8  NEUTROABS  --  11.1*  --   HGB 8.6* 8.3* 9.5*  HCT 27.1* 26.2* 30.2*  MCV 83.9 85.9 86.0  PLT 306 309 328   Cardiac Enzymes:  Recent Labs Lab 11/24/13 1135  CKTOTAL 12   BNP (last 3 results)  Recent Labs  11/13/13 0630  PROBNP 15702.0*   CBG: No results  found for this basename: GLUCAP,  in the last 168 hours  Recent Results (from the past 240 hour(s))  GRAM STAIN     Status: None   Collection Time    11/23/13  4:09 PM      Result Value Ref Range Status   Specimen Description URINE, RANDOM   Final   Special Requests NONE   Final   Gram Stain     Final   Value: CYTOSPIN PREP     WBC PRESENT,BOTH PMN AND MONONUCLEAR     GRAM POSITIVE COCCI IN CHAINS   Report Status 11/23/2013 FINAL   Final     Studies: No results found.  Scheduled Meds: . cefTRIAXone (ROCEPHIN)  IV  1 g Intravenous Q24H  . collagenase   Topical Daily  . enoxaparin (LOVENOX) injection  40 mg Subcutaneous Q24H  . ENSURE PLUS  237 mL Oral BID BM  . ferrous fumarate  1 tablet Oral TID  . midodrine  5 mg Oral TID WC  . mirtazapine  30 mg Oral QHS  . multivitamin with minerals  1 tablet Oral Daily  .  nutrition supplement (JUVEN)  1 packet Oral BID BM  . oxybutynin  5 mg Oral TID  . pantoprazole  40 mg Oral BID  . simvastatin  40 mg Oral QHS  . vitamin C  500 mg Oral Daily  . zinc sulfate  220 mg Oral Daily   Continuous Infusions: . sodium chloride      Active Problems:   Weakness    Time spent: 35 min    Pyper Olexa  [facility] Pager [phone]. If 7PM-7AM, please contact night-coverage at [email or web address], password [facility] 11/25/2013, 9:56 AM  LOS: 2 days

## 2013-11-25 NOTE — Progress Notes (Signed)
PT Cancellation Note  Patient Details Name: [name] [name] MRN: [id number] DOB: [birth year]   Cancelled Treatment:    Reason Eval/Treat Not Completed: Pain limiting ability to participate.  Attempted to see patient x2 today.  Patient refused due to pain.  Will return tomorrow for PT evaluation.   [name] 11/25/2013, 5:01 PM Durenda Hurt. Renaldo Fiddler, [facility] Acute Rehab Services Pager [phone]

## 2013-11-25 NOTE — Progress Notes (Signed)
Attempted restart PIV, unsuccessfully.  Pt refuses to relax during attempts and is watching procedure.  Veins hard from scar tissue and frequent use per tracks on arms.  Does have Left hand vein visible, but refuses to allow attempt to that hand.  Requests for a "nigerian man" to come attempt PIV.  Notified that none were available on VAST.  Will request 2nd assess from VAST coworker.  Synetta Fail RN aware.  Pt aware that he is unable to receive pain meds without PIV access- agreeable to 2nd assess.

## 2013-11-26 LAB — BASIC METABOLIC PANEL
BUN: 14 mg/dL (ref 6–23)
CHLORIDE: 101 meq/L (ref 96–112)
CO2: 18 mEq/L — ABNORMAL LOW (ref 19–32)
Calcium: 7.4 mg/dL — ABNORMAL LOW (ref 8.4–10.5)
Creatinine, Ser: 0.59 mg/dL (ref 0.50–1.35)
GFR calc Af Amer: >90 mL/min (ref >90)
Glucose, Bld: 88 mg/dL (ref 70–99)
POTASSIUM: 4.2 meq/L (ref 3.7–5.3)
SODIUM: 130 meq/L — AB (ref 137–147)

## 2013-11-26 LAB — CBC
HEMATOCRIT: 26.8 % — AB (ref 39.0–52.0)
HEMOGLOBIN: 8.3 g/dL — AB (ref 13.0–17.0)
MCH: 26.3 pg (ref 26.0–34.0)
MCHC: 31 g/dL (ref 30.0–36.0)
MCV: 85.1 fL (ref 78.0–100.0)
Platelets: 330 10*3/uL (ref 150–400)
RBC: 3.15 MIL/uL — ABNORMAL LOW (ref 4.22–5.81)
RDW: 19.6 % — ABNORMAL HIGH (ref 11.5–15.5)
WBC: 10.1 10*3/uL (ref 4.0–10.5)

## 2013-11-26 MED ORDER — DAKINS (1/4 STRENGTH) 0.125 % EX SOLN
Freq: Every day | CUTANEOUS | Status: AC
  Administered 2013-11-27: 13:00:00
  Administered 2013-11-28: 1
  Administered 2013-11-29 – 2013-12-01 (×3)
  Filled 2013-11-26 (×2): qty 473

## 2013-11-26 MED ORDER — NON FORMULARY: 1.0000 | Freq: Every morning | Status: DC

## 2013-11-26 MED ORDER — WHITE PETROLATUM GEL
Status: AC
  Administered 2013-11-26: 0.2
  Filled 2013-11-26: qty 5

## 2013-11-26 MED ORDER — SODIUM CHLORIDE 0.9 % IV SOLN
INTRAVENOUS | Status: DC
  Administered 2013-11-26: 75 mL/h via INTRAVENOUS
  Administered 2013-11-27: 06:00:00 via INTRAVENOUS

## 2013-11-26 MED ORDER — ACETAMINOPHEN 325 MG PO TABS
650.0000 mg | ORAL_TABLET | Freq: Four times a day (QID) | ORAL | Status: DC | PRN
  Administered 2013-11-26: 650 mg via ORAL

## 2013-11-26 NOTE — Consult Note (Addendum)
WOC wound consult note Reason for Consult: Chronic pressure ulcers to left and right lateral feet and sacrum, present on admission.  Wound type: Pressure ulcers Pressure Ulcer POA: Yes Measurement: Left lateral foot 4 cm x 2.6 cm x 0.3 cm Right foot (near heel) 3.4 cm x 2.2 cm x 0.2 cm Sacrum/buttocks  22 cm x 30 cm x 10cm  Wound bed: ALl wound beds are pale pink. Sacral wound has musty odor. Drainage (amount, consistency, odor) Moderate serosanguinous.  Periwound: Intact Dressing procedure/placement/frequency: Cleanse left and right foot wounds with NS and pat gently dry.  Apply Santyl ointment.  Top with NS moistened gauze, top with dry dressing and secure with tape.  Change daily.  Cleanse sacral wound with NS and pat gently dry.  Fill with Dakin's moistened gauze.  Top with ABD pads and secure with tape.  Change daily.  Will not follow at this time.  Please re-consult if needed.  Maple Hudson RN BSN CWON Pager [phone]
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
## 2013-11-26 NOTE — Progress Notes (Signed)
[facility]  Patient Status: Active (receiving services up to time of hospitalization)  [facility] is providing the following services: RN and Home Infusion Services (teaching and education will be done by nurse in the home with patient and caregiver)  If patient discharges after hours, please call [phone].   [name] [name] 11/26/2013, 9:11 AM
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
## 2013-11-26 NOTE — Progress Notes (Signed)
Pt refused all am meds except pain meds and midodrine. States will take after lunch. Refused dressing changes at this time to BLE and sacrum. Will continue to monitor.

## 2013-11-26 NOTE — Progress Notes (Signed)
Pt refused am dressing changes to BLE, and sacrum. While changing bed pad, pts sacral dressing came off, however pt refused to allow this RN to re-dress wound. Pt placed 6 abd pads packed dry in wound and covered with dry abd pad. States he is in too much pain to tolerate having wound repacked/changed per order. Will continue to monitor.

## 2013-11-26 NOTE — Progress Notes (Signed)
PT Cancellation Note  Patient Details Name: KENNEY LUBECK MRN: [id number] DOB: August 16, 1976   Cancelled Treatment:    Reason Eval/Treat Not Completed: Fatigue/lethargy limiting ability to participate; pt c/o weakness from fever.  Encouraged participation due to increased weakness with further bedrest, but pt continued to refuse.  Agreed to try tomorrow.   Jeric Slagel,CYNDI 11/26/2013, 2:52 PM

## 2013-11-26 NOTE — Progress Notes (Signed)
11/26/13 06:58 Patient's BP 69/44 HR 130,rechecked with manual cuff BP 75/40.Patient asymptomatic,asking for pain medicine.MD on call notified,order not to give pain med at this time until rounding MD. Epifania Gore, Lynn Ito

## 2013-11-26 NOTE — Progress Notes (Signed)
11/25/13 20:25 Patient refused foley catheter care. Cordella Nyquist Joselita,RN

## 2013-11-27 DIAGNOSIS — Z933 Colostomy status: Secondary | ICD-10-CM

## 2013-11-27 DIAGNOSIS — Z515 Encounter for palliative care: Secondary | ICD-10-CM

## 2013-11-27 DIAGNOSIS — I959 Hypotension, unspecified: Secondary | ICD-10-CM | POA: Diagnosis present

## 2013-11-27 DIAGNOSIS — R7881 Bacteremia: Secondary | ICD-10-CM | POA: Insufficient documentation

## 2013-11-27 DIAGNOSIS — G894 Chronic pain syndrome: Secondary | ICD-10-CM

## 2013-11-27 LAB — CLOSTRIDIUM DIFFICILE BY PCR: Toxigenic C. Difficile by PCR: POSITIVE — AB

## 2013-11-27 MED ORDER — METRONIDAZOLE 500 MG PO TABS
500.0000 mg | ORAL_TABLET | Freq: Three times a day (TID) | ORAL | Status: DC
  Administered 2013-11-27 – 2013-12-02 (×12): 500 mg via ORAL
  Filled 2013-11-27 (×25): qty 1

## 2013-11-27 MED ORDER — ENSURE COMPLETE PO LIQD
237.0000 mL | Freq: Two times a day (BID) | ORAL | Status: DC
  Administered 2013-12-01 – 2013-12-11 (×14): 237 mL via ORAL
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
## 2013-11-27 NOTE — Progress Notes (Signed)
PT Cancellation Note  Patient Details Name: [name] [name] MRN: [id number] DOB: 01-27-1976   Cancelled Treatment:    Reason Eval/Treat Not Completed: Pain limiting ability to participate; awaiting pain meds over an hour.  Patient requesting to work with bands for UE strength.  Will return later with bands to assist in UE strengthening and mobility as tolerated.   Sota Hetz,CYNDI 11/27/2013, 11:34 AM

## 2013-11-27 NOTE — Progress Notes (Signed)
CRITICAL VALUE ALERT  Critical value received:  CDIFF positive  Date of notification:  11/27/2013  Time of notification:  10:37 AM  Critical value read back:yes  Nurse who received alert:  Theadora Rama  MD notified (1st page):  Dr. Benjamine Mola  Time of first page:  1038  MD notified (2nd page):  Time of second page:  Responding MD:  Dr. Benjamine Mola  Time MD responded:  [phone]
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
## 2013-11-27 NOTE — Progress Notes (Signed)
11/27/2013 3:35 PM  Pt started c/o shortness of breath, sudden in onset, about fifteen minutes ago.  Pt oxygen saturations on RA 99-100%, BP slightly elevated but stable at 154/81, heart rate high in the 162-164 range.  Pt denies chest pain, states he may be having an anxiety attack.  Pt received PRN dose of ativan around 1245 this afternoon.  No reports of left arm, neck, shoulder,jaw, or back pain.  No diaphoresis noted, color appears normal for patient.  Dr. Benjamine Mola notified, orders received to DC IVF.  IVF D/C'd per MD order.  At this time, patient reports slightly decreased but still persistent shortness of breath that is "tolerable".  Will continue to monitor patient. Theadora Rama

## 2013-11-27 NOTE — Progress Notes (Signed)
Entered room and pts bed sheets and gown was saturated with wound drainage,pt refused to have AM dressing change,pt refused to change bed linen,pt only changed his gown,PT stated"my wound drains anyway so it does not matter just give me my pain meds so i can go back to sleep"the patient refused to reinforce dsg will cont to monitor Artemio Aly RN
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
## 2013-11-28 DIAGNOSIS — D509 Iron deficiency anemia, unspecified: Secondary | ICD-10-CM

## 2013-11-28 LAB — CULTURE, BLOOD (ROUTINE X 2)

## 2013-11-28 MED ORDER — SODIUM CHLORIDE 0.9 % IV SOLN
500.0000 mg | Freq: Four times a day (QID) | INTRAVENOUS | Status: DC
  Administered 2013-11-28 – 2013-12-11 (×52): 500 mg via INTRAVENOUS
  Filled 2013-11-28 (×55): qty 500
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
## 2013-11-28 NOTE — Progress Notes (Signed)
Progress Note from the Palliative Medicine Team at [facility]  Subjective:  patient is weak, but alert and oriented  -continued conversation regarding GOC and options, diagnosis prognosis,  disposition and options.  -spoke to his CHF, nutrition status, severe wounds, chronic pain, anticipatory care needs, psychological support and safety addressed in detail  A detailed discussion was had today regarding advanced directives.  Concepts specific to code status, artifical feeding and hydration, continued IV antibiotics and rehospitalization was had.  The difference between a aggressive medical intervention path  and a palliative comfort care path for this patient at this time was had.  Values and goals of care important to patient  were attempted to be elicited.  Encouraged to document AD and HPOA  Concept of Hospice and Palliative Care were discussed  Questions and concerns addressed.   -spoke to Lissa Morales [phone] at [facility]   Objective: Allergies  Allergen Reactions  . Other Hives    squash  . Vicodin [Hydrocodone-Acetaminophen] Nausea And Vomiting and Other (See Comments)    Hot flashes  . Nafcillin Hives   Scheduled Meds: . collagenase   Topical Daily  . docusate sodium  100 mg Oral BID  . enoxaparin (LOVENOX) injection  40 mg Subcutaneous Q24H  . feeding supplement (ENSURE COMPLETE)  237 mL Oral BID BM  . ferrous fumarate  1 tablet Oral TID  . imipenem-cilastatin  500 mg Intravenous 4 times per day  . metroNIDAZOLE  500 mg Oral 3 times per day  . midodrine  5 mg Oral TID WC  . mirtazapine  30 mg Oral QHS  . multivitamin with minerals  1 tablet Oral Daily  . nutrition supplement (JUVEN)  1 packet Oral BID BM  . oxybutynin  5 mg Oral TID  . pantoprazole  40 mg Oral BID  . simvastatin  40 mg Oral QHS  . sodium hypochlorite   Irrigation Daily  . vitamin C  500 mg Oral Daily  . zinc sulfate  220 mg Oral Daily   Continuous Infusions:  PRN  Meds:.acetaminophen, HYDROmorphone (DILAUDID) injection, LORazepam, ondansetron (ZOFRAN) IV, ondansetron, oxyCODONE  BP 80/49  Pulse 126  Temp(Src) 98.2 F (36.8 C) (Oral)  Resp 21  Ht 6\' 2"  (1.88 m)  Wt 69.3 kg (152 lb 12.5 oz)  BMI 19.61 kg/m2  SpO2 100%   PPS: 20 %  Pain Score: 7/10  Pain Location lower back   Intake/Output Summary (Last 24 hours) at 11/28/13 1058 Last data filed at 11/27/13 1700  Gross per 24 hour  Intake    120 ml  Output    350 ml  Net   -230 ml       Physical Exam:  General: chronically ill appearing, weak and lethargic HEENT: Moist buccal membranes, no exudate  Chest: CTA  CVS: RRR  Abdomen: soft NT +BS  Ext: BLE +1 edema, muscle atrophy  Neuro: alert and oriented  Psych:poor insight into his medical situation  Labs: CBC    Component Value Date/Time   WBC 10.1 11/26/2013 0710   RBC 3.15* 11/26/2013 0710   RBC 2.74* 01/30/2013 1930   HGB 8.3* 11/26/2013 0710   HCT 26.8* 11/26/2013 0710   PLT 330 11/26/2013 0710   MCV 85.1 11/26/2013 0710   MCH 26.3 11/26/2013 0710   MCHC 31.0 11/26/2013 0710   RDW 19.6* 11/26/2013 0710   LYMPHSABS 1.3 11/23/2013 1710   MONOABS 0.5 11/23/2013 1710   EOSABS 0.0 11/23/2013 1710   BASOSABS 0.0  11/23/2013 1710    BMET    Component Value Date/Time   NA 130* 11/26/2013 0710   K 4.2 11/26/2013 0710   CL 101 11/26/2013 0710   CO2 18* 11/26/2013 0710   GLUCOSE 88 11/26/2013 0710   BUN 14 11/26/2013 0710   CREATININE 0.59 11/26/2013 0710   CREATININE 0.47* 10/23/2012 1635   CALCIUM 7.4* 11/26/2013 0710   GFRNONAA >90 11/26/2013 0710   GFRAA >90 11/26/2013 0710    CMP     Component Value Date/Time   NA 130* 11/26/2013 0710   K 4.2 11/26/2013 0710   CL 101 11/26/2013 0710   CO2 18* 11/26/2013 0710   GLUCOSE 88 11/26/2013 0710   BUN 14 11/26/2013 0710   CREATININE 0.59 11/26/2013 0710   CREATININE 0.47* 10/23/2012 1635   CALCIUM 7.4* 11/26/2013 0710   PROT 6.8 11/23/2013 1710   ALBUMIN 1.2* 11/23/2013 1710   AST 10 11/23/2013  1710   ALT 9 11/23/2013 1710   ALKPHOS 213* 11/23/2013 1710   BILITOT 1.1 11/23/2013 1710   GFRNONAA >90 11/26/2013 0710   GFRAA >90 11/26/2013 0710     Assessment and Plan: 1. Code Status:Full code 2. Symptom Control: 1. Pain: Continue Oxycodone 15 mg po every 4 hrs prn as prescribed, he tells me he does not want any changes made for further attempts at better pain control He                   2. Failure to thrive/weakness  3. Psycho/Social:  Again talked with patient about his medical situation and limited optios.  4. Disposition:  Encouraged patient to realistically consider his care needs and options.  A SNF for  nursing care and wound care needs, and nutrition and psychological support is recommended.    Time In Time Out Total Time Spent with Patient Total Overall Time  1600 1645 45 min 45 min    Greater than 50%  of this time was spent counseling and coordinating care related to the above assessment and plan.  Lorinda CreedMary Danelle Curiale NP  Palliative Medicine Team Team Phone # [phone] Pager [phone]   1

## 2013-11-29 DIAGNOSIS — I509 Heart failure, unspecified: Secondary | ICD-10-CM

## 2013-11-29 DIAGNOSIS — I5022 Chronic systolic (congestive) heart failure: Secondary | ICD-10-CM

## 2013-11-29 LAB — CBC
HEMATOCRIT: 25 % — AB (ref 39.0–52.0)
Hemoglobin: 7.7 g/dL — ABNORMAL LOW (ref 13.0–17.0)
MCH: 26.3 pg (ref 26.0–34.0)
MCHC: 30.8 g/dL (ref 30.0–36.0)
MCV: 85.3 fL (ref 78.0–100.0)
Platelets: 239 10*3/uL (ref 150–400)
RBC: 2.93 MIL/uL — ABNORMAL LOW (ref 4.22–5.81)
RDW: 19.9 % — ABNORMAL HIGH (ref 11.5–15.5)
WBC: 8.2 10*3/uL (ref 4.0–10.5)

## 2013-11-29 LAB — BASIC METABOLIC PANEL
BUN: 10 mg/dL (ref 6–23)
CALCIUM: 7.5 mg/dL — AB (ref 8.4–10.5)
CHLORIDE: 105 meq/L (ref 96–112)
CO2: 17 meq/L — AB (ref 19–32)
Creatinine, Ser: 0.53 mg/dL (ref 0.50–1.35)
GFR calc Af Amer: >90 mL/min (ref >90)
GFR calc non Af Amer: >90 mL/min (ref >90)
GLUCOSE: 89 mg/dL (ref 70–99)
Potassium: 4 mEq/L (ref 3.7–5.3)
Sodium: 135 mEq/L — ABNORMAL LOW (ref 137–147)

## 2013-11-29 LAB — PREALBUMIN

## 2013-11-29 LAB — IRON AND TIBC
IRON: 44 ug/dL (ref 42–135)
Saturation Ratios: 56 % — ABNORMAL HIGH (ref 20–55)
TIBC: 79 ug/dL — ABNORMAL LOW (ref 215–435)
UIBC: 35 ug/dL — AB (ref 125–400)

## 2013-11-29 LAB — FERRITIN: FERRITIN: 1023 ng/mL — AB (ref 22–322)

## 2013-11-29 MED ORDER — PRO-STAT SUGAR FREE PO LIQD
30.0000 mL | Freq: Two times a day (BID) | ORAL | Status: DC
  Administered 2013-11-29 – 2013-12-04 (×5): 30 mL via ORAL
  Filled 2013-11-29 (×15): qty 30

## 2013-11-29 MED ORDER — DIGOXIN 125 MCG PO TABS
0.1250 mg | ORAL_TABLET | Freq: Every day | ORAL | Status: DC
  Administered 2013-11-29 – 2013-12-10 (×12): 0.125 mg via ORAL
  Filled 2013-11-29 (×15): qty 1

## 2013-11-29 MED ORDER — DRONABINOL 2.5 MG PO CAPS: 2.5000 mg | ORAL_CAPSULE | Freq: Two times a day (BID) | ORAL | Status: DC
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
## 2013-11-29 NOTE — Progress Notes (Signed)
PT Cancellation Note  Patient Details Name: GARY PITTARD MRN: [id number] DOB: December 04, 1975   Cancelled Treatment:    Reason Eval/Treat Not Completed: Fatigue/lethargy limiting ability to participate;Pain limiting ability to participate; patient reports too painful and fatigued from dressing change earlier to try OOB today.  Will attempt tomorrow as time permits.   [name],CYNDI 11/29/2013, 3:01 PM

## 2013-11-29 NOTE — Progress Notes (Signed)
Paged MD @ 08:27 to notify that patient's B/P was 78/56 & pulse was 131.  Patient requesting meds for pain & anxiety.  Dr. Arbutus Leas returned my call @ 08:42.  MD stated that pain & anxiety meds were OK to administer if his SBP was in the 70s-80s.  Peri Maris, MBA, BS, RN
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
## 2013-11-30 DIAGNOSIS — R7881 Bacteremia: Secondary | ICD-10-CM | POA: Diagnosis present

## 2013-11-30 DIAGNOSIS — G894 Chronic pain syndrome: Secondary | ICD-10-CM | POA: Diagnosis present

## 2013-11-30 DIAGNOSIS — A0472 Enterocolitis due to Clostridium difficile, not specified as recurrent: Secondary | ICD-10-CM | POA: Diagnosis present

## 2013-11-30 DIAGNOSIS — Z515 Encounter for palliative care: Secondary | ICD-10-CM

## 2013-11-30 LAB — BASIC METABOLIC PANEL
BUN: 10 mg/dL (ref 6–23)
CALCIUM: 7.8 mg/dL — AB (ref 8.4–10.5)
CO2: 19 mEq/L (ref 19–32)
CREATININE: 0.53 mg/dL (ref 0.50–1.35)
Chloride: 105 mEq/L (ref 96–112)
GFR calc non Af Amer: >90 mL/min (ref >90)
Glucose, Bld: 84 mg/dL (ref 70–99)
Potassium: 3.9 mEq/L (ref 3.7–5.3)
SODIUM: 137 meq/L (ref 137–147)

## 2013-11-30 LAB — CBC
HCT: 27.8 % — ABNORMAL LOW (ref 39.0–52.0)
Hemoglobin: 8.6 g/dL — ABNORMAL LOW (ref 13.0–17.0)
MCH: 26.4 pg (ref 26.0–34.0)
MCHC: 30.9 g/dL (ref 30.0–36.0)
MCV: 85.3 fL (ref 78.0–100.0)
PLATELETS: 281 10*3/uL (ref 150–400)
RBC: 3.26 MIL/uL — AB (ref 4.22–5.81)
RDW: 19.9 % — AB (ref 11.5–15.5)
WBC: 8.6 10*3/uL (ref 4.0–10.5)

## 2013-11-30 LAB — TROPONIN I

## 2013-11-30 LAB — PREALBUMIN: Prealbumin: <3 mg/dL — ABNORMAL LOW (ref 17.0–34.0)

## 2013-11-30 MED ORDER — ONDANSETRON HCL 4 MG/2ML IJ SOLN
4.0000 mg | Freq: Once | INTRAMUSCULAR | Status: AC
  Administered 2013-11-30: 4 mg via INTRAVENOUS
  Filled 2013-11-30: qty 2

## 2013-11-30 MED ORDER — ONDANSETRON HCL 4 MG/2ML IJ SOLN
4.0000 mg | INTRAMUSCULAR | Status: DC | PRN
  Administered 2013-12-01 – 2013-12-11 (×27): 4 mg via INTRAVENOUS
  Filled 2013-11-30 (×29): qty 2

## 2013-11-30 MED ORDER — ONDANSETRON HCL 4 MG PO TABS
4.0000 mg | ORAL_TABLET | ORAL | Status: DC | PRN
Start: 2013-11-30 — End: 2013-12-11
  Administered 2013-12-02 – 2013-12-11 (×3): 4 mg via ORAL
  Filled 2013-11-30 (×5): qty 1

## 2013-11-30 MED ORDER — BENEPROTEIN PO POWD
1.0000 | Freq: Three times a day (TID) | ORAL | Status: DC
  Administered 2013-11-30: 6 g via ORAL
  Filled 2013-11-30 (×2): qty 227
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
## 2013-11-30 NOTE — Progress Notes (Signed)
Patient refused to go down for abd xray tonight,states"I'm not going tonight,I'm tired,I'm going to sleep." RN offered to clean him up and change his sacral dressing but patient refused as well.States"I'm going to sleep". Will [name] Loadholt Joselita,RN
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
## 2013-12-01 ENCOUNTER — Inpatient Hospital Stay (HOSPITAL_COMMUNITY): Payer: Medicaid Other

## 2013-12-01 DIAGNOSIS — A419 Sepsis, unspecified organism: Principal | ICD-10-CM

## 2013-12-01 LAB — TROPONIN I: Troponin I: <0.3 ng/mL (ref <0.30)

## 2013-12-01 MED ORDER — MEGESTROL ACETATE 40 MG/ML PO SUSP
800.0000 mg | Freq: Every day | ORAL | Status: DC
  Administered 2013-12-01 – 2013-12-07 (×6): 800 mg via ORAL
  Filled 2013-12-01 (×11): qty 20

## 2013-12-01 NOTE — Progress Notes (Signed)
12/01/13 07:10 Patient refused again to go down for xray,wants to sleep. Eathan Groman Joselita,RN
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
## 2013-12-02 DIAGNOSIS — R012 Other cardiac sounds: Secondary | ICD-10-CM

## 2013-12-02 DIAGNOSIS — I319 Disease of pericardium, unspecified: Secondary | ICD-10-CM

## 2013-12-02 DIAGNOSIS — I3139 Other pericardial effusion (noninflammatory): Secondary | ICD-10-CM | POA: Diagnosis present

## 2013-12-02 DIAGNOSIS — I313 Pericardial effusion (noninflammatory): Secondary | ICD-10-CM | POA: Diagnosis present

## 2013-12-02 DIAGNOSIS — R079 Chest pain, unspecified: Secondary | ICD-10-CM

## 2013-12-02 LAB — BASIC METABOLIC PANEL
BUN: 6 mg/dL (ref 6–23)
CHLORIDE: 107 meq/L (ref 96–112)
CO2: 19 mEq/L (ref 19–32)
Calcium: 7.3 mg/dL — ABNORMAL LOW (ref 8.4–10.5)
Creatinine, Ser: 0.42 mg/dL — ABNORMAL LOW (ref 0.50–1.35)
GFR calc non Af Amer: >90 mL/min (ref >90)
Glucose, Bld: 79 mg/dL (ref 70–99)
POTASSIUM: 3.7 meq/L (ref 3.7–5.3)
Sodium: 137 mEq/L (ref 137–147)

## 2013-12-02 LAB — CBC
HCT: 27.6 % — ABNORMAL LOW (ref 39.0–52.0)
HEMOGLOBIN: 8.4 g/dL — AB (ref 13.0–17.0)
MCH: 26 pg (ref 26.0–34.0)
MCHC: 30.4 g/dL (ref 30.0–36.0)
MCV: 85.4 fL (ref 78.0–100.0)
Platelets: 275 10*3/uL (ref 150–400)
RBC: 3.23 MIL/uL — AB (ref 4.22–5.81)
RDW: 20.4 % — ABNORMAL HIGH (ref 11.5–15.5)
WBC: 8.2 10*3/uL (ref 4.0–10.5)

## 2013-12-02 MED ORDER — SODIUM CHLORIDE 0.9 % IJ SOLN
10.0000 mL | Freq: Two times a day (BID) | INTRAMUSCULAR | Status: DC
  Administered 2013-12-02 – 2013-12-11 (×4): 10 mL

## 2013-12-02 MED ORDER — SODIUM CHLORIDE 0.9 % IJ SOLN
10.0000 mL | INTRAMUSCULAR | Status: DC | PRN
  Administered 2013-12-09 – 2013-12-11 (×3): 10 mL
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
## 2013-12-02 NOTE — Progress Notes (Signed)
Peripherally Inserted Central Catheter/Midline Placement  The IV Nurse has discussed with the patient and/or persons authorized to consent for the patient, the purpose of this procedure and the potential benefits and risks involved with this procedure.  The benefits include less needle sticks, lab draws from the catheter and patient may be discharged home with the catheter.  Risks include, but not limited to, infection, bleeding, blood clot (thrombus formation), and puncture of an artery; nerve damage and irregular heat beat.  Alternatives to this procedure were also discussed.  PICC/Midline Placement Documentation  PICC / Midline Single Lumen 12/02/13 PICC Right Basilic 42 cm 0 cm (Active)  Indication for Insertion or Continuance of Line Home intravenous therapies (PICC only) 12/02/2013 12:16 PM  Exposed Catheter (cm) 0 cm 12/02/2013 12:16 PM  Line Status Flushed;Saline locked;Blood return noted 12/02/2013 12:16 PM  Dressing Intervention New dressing 12/02/2013 12:16 PM  Dressing Change Due 12/09/13 12/02/2013 12:16 PM       Ethelda Chick 12/02/2013, 12:31 PM

## 2013-12-03 DIAGNOSIS — I428 Other cardiomyopathies: Secondary | ICD-10-CM | POA: Diagnosis present

## 2013-12-03 DIAGNOSIS — I319 Disease of pericardium, unspecified: Secondary | ICD-10-CM

## 2013-12-03 DIAGNOSIS — A0472 Enterocolitis due to Clostridium difficile, not specified as recurrent: Secondary | ICD-10-CM

## 2013-12-03 NOTE — Progress Notes (Signed)
  Echocardiogram 2D Echocardiogram has been performed.  [name] [name] 12/03/2013, 3:30 PM
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
## 2013-12-03 NOTE — Clinical Social Work Note (Signed)
Patient agreeable to CSW sending out information for SNF placement. Full assessment to follow, CSW will initiate bed search 2/24.  Genelle Bal, MSW, LCSW [phone]

## 2013-12-03 NOTE — Progress Notes (Signed)
The presence of a rub excludes the possibility of a significant effusion since rub is due to epicardial and pericardial friction. Plan supportive care as directed/allowed by his vitals.

## 2013-12-03 NOTE — Progress Notes (Signed)
Patient refused flagyl, MD aware.
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
## 2013-12-04 DIAGNOSIS — R7881 Bacteremia: Secondary | ICD-10-CM

## 2013-12-04 DIAGNOSIS — B9689 Other specified bacterial agents as the cause of diseases classified elsewhere: Secondary | ICD-10-CM

## 2013-12-04 MED ORDER — INDOMETHACIN 25 MG SUPPOSITORY: 25.0000 mg | Freq: Three times a day (TID) | RECTAL | Status: DC

## 2013-12-04 MED ORDER — METRONIDAZOLE 50 MG/ML ORAL SUSPENSION
500.0000 mg | Freq: Three times a day (TID) | ORAL | Status: AC
  Administered 2013-12-04 – 2013-12-09 (×11): 500 mg via ORAL
  Filled 2013-12-04 (×23): qty 10

## 2013-12-04 MED ORDER — COLCHICINE 0.6 MG PO TABS
0.6000 mg | ORAL_TABLET | Freq: Two times a day (BID) | ORAL | Status: DC
  Administered 2013-12-04 – 2013-12-10 (×14): 0.6 mg via ORAL
  Filled 2013-12-04 (×16): qty 1

## 2013-12-04 MED ORDER — INDOMETHACIN 25 MG PO CAPS
25.0000 mg | ORAL_CAPSULE | Freq: Three times a day (TID) | ORAL | Status: AC
  Administered 2013-12-04 – 2013-12-06 (×3): 25 mg via ORAL
  Filled 2013-12-04 (×21): qty 1

## 2013-12-04 MED ORDER — NON FORMULARY: Freq: Every day | Status: DC

## 2013-12-04 MED ORDER — SODIUM CHLORIDE 0.9 % IR SOLN
1000.0000 mL | Freq: Every day | Status: DC
  Administered 2013-12-04 – 2013-12-10 (×6): 1000 mL

## 2013-12-04 NOTE — Clinical Social Work Placement (Addendum)
Clinical Social Work Department CLINICAL SOCIAL WORK PLACEMENT NOTE 12/04/2013  Patient:  [name] [name], [name] [name]  Account Number:  [id number] Admit date:  11/23/2013  Clinical Social Worker:  Genelle Bal, LCSW  Date/time:  12/04/2013 04:51 AM  Clinical Social Work is seeking post-discharge placement for this patient at the following level of care:   SKILLED NURSING   (*CSW will update this form in Epic as items are completed)   12/03/2013  Patient/family provided with [facility] Department of Clinical Social Work's list of facilities offering this level of care within the geographic area requested by the patient (or if unable, by the patient's family).  12/03/2013  Patient/family informed of their freedom to choose among providers that offer the needed level of care, that participate in Medicare, Medicaid or managed care program needed by the patient, have an available bed and are willing to accept the patient.    Patient/family informed of [facility]' ownership interest in [facility], as well as of the fact that they are under no obligation to receive care at this facility.  PASARR submitted to [employer] on  PASARR number received from [employer] in 2013   FL2 transmitted to all facilities in geographic area requested by pt/family on  12/04/2013 FL2 transmitted to all facilities within larger geographic area on 12/07/13  Patient informed that his/her managed care company has contracts with or will negotiate with  certain facilities, including the following:     Patient/family informed of bed offers received:   Patient chooses bed at  Physician recommends and patient chooses bed at    Patient to be transferred to on   Patient to be transferred to facility by   The following physician request were entered in Epic:  Additional Comments: 12/07/13: CSW talked with medical director, Dr. Jacky Kindle regarding placement for patient.

## 2013-12-04 NOTE — Progress Notes (Signed)
PT Cancellation Note  Patient Details Name: OBADA SHEILS MRN: [id number] DOB: [birth year]   Cancelled Treatment:    Reason Eval/Treat Not Completed: Fatigue/lethargy limiting ability to participate;Pain limiting ability to participate; patient c/o not sleeping well and hurting too much to participate in UE strengthening this morning.  Did educate on boosting exercise using bedrails and pt performed x 1, but c/o increased pain with this activity.  Will check back tomorrow.   Brielyn Bosak,CYNDI 12/04/2013, 10:10 AM

## 2013-12-04 NOTE — Progress Notes (Addendum)
Agree with the note and recommendations. BP has been soft since admission. Pericarditis in setting of bacteremia raises question of purulent  pericarditis and/or SBE. Will continue to follow.

## 2013-12-04 NOTE — Progress Notes (Signed)
[name] [name] received request from Dr. Katrinka Blazing for TEE for tomorrow. OR scheduling was unable to be reached. She will try to schedule for tomorrow. Orders tentatively written.  Dayna Dunn PA-C
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
## 2013-12-05 DIAGNOSIS — I428 Other cardiomyopathies: Secondary | ICD-10-CM

## 2013-12-05 DIAGNOSIS — Z1619 Resistance to other specified beta lactam antibiotics: Secondary | ICD-10-CM

## 2013-12-05 DIAGNOSIS — Z87440 Personal history of urinary (tract) infections: Secondary | ICD-10-CM

## 2013-12-05 DIAGNOSIS — E860 Dehydration: Secondary | ICD-10-CM

## 2013-12-05 DIAGNOSIS — B9689 Other specified bacterial agents as the cause of diseases classified elsewhere: Secondary | ICD-10-CM

## 2013-12-05 LAB — CULTURE, BLOOD (ROUTINE X 2)
CULTURE: NO GROWTH
CULTURE: NO GROWTH

## 2013-12-05 NOTE — Progress Notes (Signed)
PT Cancellation Note  Patient Details Name: [name] [name] MRN: [id number] DOB: 1976-01-17   Cancelled Treatment:    Reason Eval/Treat Not Completed: Patient declined, no reason specified.  Pt reports he is too tired. I tried to encourage him to participate in bed level exercises, but still refused.  He reports, "I definitely don't want to try to get up today.  I'm too tired", but was agreeable for PT to check back on him tomorrow.    Thanks,   Rollene Rotunda. Eila Runyan, PT, DPT [phone]   12/05/2013, 4:24 PM
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
## 2013-12-05 NOTE — Progress Notes (Addendum)
Rub is still present. Agree with noted plan. Refused TEE today. Has lunch tray at bedside but has not eaten. TEE tomorrow.
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
## 2013-12-06 ENCOUNTER — Encounter (HOSPITAL_COMMUNITY): Payer: Self-pay | Admitting: *Deleted

## 2013-12-06 ENCOUNTER — Encounter (HOSPITAL_COMMUNITY)
Admission: EM | Disposition: A | Discharge status: Home Health | Payer: Self-pay | Source: Home / Self Care | Attending: Internal Medicine

## 2013-12-06 LAB — BASIC METABOLIC PANEL
BUN: 5 mg/dL — ABNORMAL LOW (ref 6–23)
CO2: 20 mEq/L (ref 19–32)
Calcium: 7 mg/dL — ABNORMAL LOW (ref 8.4–10.5)
Chloride: 111 mEq/L (ref 96–112)
Creatinine, Ser: 0.36 mg/dL — ABNORMAL LOW (ref 0.50–1.35)
GFR calc Af Amer: >90 mL/min (ref >90)
GFR calc non Af Amer: >90 mL/min (ref >90)
Glucose, Bld: 97 mg/dL (ref 70–99)
Potassium: 3.7 mEq/L (ref 3.7–5.3)
Sodium: 140 mEq/L (ref 137–147)

## 2013-12-06 LAB — CBC
HEMATOCRIT: 26.5 % — AB (ref 39.0–52.0)
Hemoglobin: 8.2 g/dL — ABNORMAL LOW (ref 13.0–17.0)
MCH: 26.5 pg (ref 26.0–34.0)
MCHC: 30.9 g/dL (ref 30.0–36.0)
MCV: 85.8 fL (ref 78.0–100.0)
Platelets: 444 10*3/uL — ABNORMAL HIGH (ref 150–400)
RBC: 3.09 MIL/uL — ABNORMAL LOW (ref 4.22–5.81)
RDW: 22 % — AB (ref 11.5–15.5)
WBC: 6.7 10*3/uL (ref 4.0–10.5)

## 2013-12-06 SURGERY — ECHOCARDIOGRAM, TRANSESOPHAGEAL
Anesthesia: Moderate Sedation

## 2013-12-06 MED ORDER — LIDOCAINE VISCOUS 2 % MT SOLN
OROMUCOSAL | Status: AC
  Filled 2013-12-06: qty 15

## 2013-12-06 MED ORDER — SODIUM CHLORIDE 0.9 % IV SOLN
INTRAVENOUS | Status: DC
  Administered 2013-12-06 – 2013-12-11 (×2): via INTRAVENOUS

## 2013-12-06 MED ORDER — FENTANYL CITRATE 0.05 MG/ML IJ SOLN
INTRAMUSCULAR | Status: AC
  Filled 2013-12-06: qty 2

## 2013-12-06 MED ORDER — MIDAZOLAM HCL 5 MG/ML IJ SOLN
INTRAMUSCULAR | Status: AC
  Filled 2013-12-06: qty 2
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
## 2013-12-06 NOTE — OR Nursing (Signed)
TEE cancelled by Dr. Mayford Knife due to hypotension.  BP 82/54

## 2013-12-06 NOTE — Progress Notes (Signed)
See note below. Felt to tenuous for TEE due to BP. This BP is his typical.  Recommend therapy as endocarditis from standpoint of duration. We will follow and advise as needed. Please call if concerns or questions.

## 2013-12-06 NOTE — Progress Notes (Signed)
PT Cancellation Note  Patient Details Name: [name] [name] MRN: [id number] DOB: 1975-11-30   Cancelled Treatment:    Reason Eval/Treat Not Completed: Patient at procedure or test/unavailable; patient in endoscopy for TEE.     [facility] 12/06/2013, 8:59 AM

## 2013-12-06 NOTE — Progress Notes (Signed)
Could not proceed with TEE.  Patient's BP 82/65mmHg.  Cannot give sedation for procedure due to hypotension.  Discussed with Dr. Katrinka Blazing and TEE cancelled.

## 2013-12-07 NOTE — Progress Notes (Signed)
Utilization review completed.  
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
## 2013-12-07 NOTE — Clinical Social Work Note (Signed)
CSW consulted with medical director regarding patient and difficult to place status. CSW advised to send clinicals to [facility] and [facility]. CSW talked with admissions director at [facility] regarding patient and she indicated that they will review patient's information. As of the writing of this note [facility] has not responded and [facility]	[place] responded no.  CSW will follow-up on Monday with facilities regarding placement.  Genelle Bal, MSW, LCSW [phone]
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
## 2013-12-08 DIAGNOSIS — L97409 Non-pressure chronic ulcer of unspecified heel and midfoot with unspecified severity: Secondary | ICD-10-CM

## 2013-12-08 NOTE — Plan of Care (Signed)
Problem: Phase III Progression Outcomes Goal: Discharge plan remains appropriate-arrangements made Outcome: Progressing Waiting for SNF placement
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
## 2013-12-09 LAB — CBC
HCT: 25.8 % — ABNORMAL LOW (ref 39.0–52.0)
Hemoglobin: 8.1 g/dL — ABNORMAL LOW (ref 13.0–17.0)
MCH: 26.8 pg (ref 26.0–34.0)
MCHC: 31.4 g/dL (ref 30.0–36.0)
MCV: 85.4 fL (ref 78.0–100.0)
PLATELETS: 463 10*3/uL — AB (ref 150–400)
RBC: 3.02 MIL/uL — ABNORMAL LOW (ref 4.22–5.81)
RDW: 22.7 % — ABNORMAL HIGH (ref 11.5–15.5)
WBC: 6.3 10*3/uL (ref 4.0–10.5)

## 2013-12-09 LAB — BASIC METABOLIC PANEL
BUN: 5 mg/dL — ABNORMAL LOW (ref 6–23)
CO2: 18 meq/L — AB (ref 19–32)
Calcium: 7.1 mg/dL — ABNORMAL LOW (ref 8.4–10.5)
Chloride: 114 mEq/L — ABNORMAL HIGH (ref 96–112)
Creatinine, Ser: 0.42 mg/dL — ABNORMAL LOW (ref 0.50–1.35)
GFR calc Af Amer: >90 mL/min (ref >90)
GFR calc non Af Amer: >90 mL/min (ref >90)
Glucose, Bld: 79 mg/dL (ref 70–99)
Potassium: 3.8 mEq/L (ref 3.7–5.3)
Sodium: 143 mEq/L (ref 137–147)
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
## 2013-12-09 NOTE — Progress Notes (Signed)
Spoke with patient's mother on the phone.  She would like the MD to call her about her son's condition.

## 2013-12-10 MED ORDER — METRONIDAZOLE 50 MG/ML ORAL SUSPENSION: 500.0000 mg | Freq: Three times a day (TID) | ORAL | Status: DC

## 2013-12-10 MED ORDER — SODIUM CHLORIDE 0.9 % IV SOLN: 500.0000 mg | Freq: Four times a day (QID) | INTRAVENOUS | Status: AC

## 2013-12-10 MED ORDER — MEGESTROL ACETATE 40 MG/ML PO SUSP: 800.0000 mg | Freq: Every day | ORAL | Status: DC

## 2013-12-10 MED ORDER — INDOMETHACIN 25 MG PO CAPS: 25.0000 mg | ORAL_CAPSULE | Freq: Three times a day (TID) | ORAL | Status: DC

## 2013-12-10 MED ORDER — COLCHICINE 0.6 MG PO TABS: 0.6000 mg | ORAL_TABLET | Freq: Two times a day (BID) | ORAL | Status: DC

## 2013-12-10 MED ORDER — ONDANSETRON HCL 4 MG PO TABS: 4.0000 mg | ORAL_TABLET | ORAL | Status: DC | PRN

## 2013-12-10 MED ORDER — CALCIUM CARBONATE ANTACID 500 MG PO CHEW
1.0000 | CHEWABLE_TABLET | Freq: Two times a day (BID) | ORAL | Status: DC | PRN
  Administered 2013-12-10: 200 mg via ORAL
  Filled 2013-12-10: qty 1

## 2013-12-10 MED ORDER — BENEPROTEIN PO POWD: 1.0000 | Freq: Three times a day (TID) | ORAL | Status: AC

## 2013-12-10 MED ORDER — OXYCODONE HCL 15 MG PO TABS: 15.0000 mg | ORAL_TABLET | ORAL | Status: AC | PRN

## 2013-12-10 MED ORDER — HEPARIN SOD (PORK) LOCK FLUSH 100 UNIT/ML IV SOLN
250.0000 [IU] | INTRAVENOUS | Status: DC | PRN
  Administered 2013-12-10 – 2013-12-11 (×2): 250 [IU]

## 2013-12-10 MED ORDER — DIGOXIN 125 MCG PO TABS: 0.1250 mg | ORAL_TABLET | Freq: Every day | ORAL | Status: DC

## 2013-12-10 MED ORDER — INDOMETHACIN 25 MG PO CAPS: 25.0000 mg | ORAL_CAPSULE | Freq: Three times a day (TID) | ORAL | Status: AC

## 2013-12-10 MED ORDER — ONDANSETRON HCL 4 MG PO TABS
4.0000 mg | ORAL_TABLET | ORAL | Status: AC | PRN
Start: 2013-12-10 — End: ?

## 2013-12-10 MED ORDER — SODIUM CHLORIDE 0.9 % IV SOLN: 500.0000 mg | Freq: Four times a day (QID) | INTRAVENOUS | Status: DC

## 2013-12-10 MED ORDER — HEPARIN SOD (PORK) LOCK FLUSH 100 UNIT/ML IV SOLN
250.0000 [IU] | Freq: Every day | INTRAVENOUS | Status: DC
Start: 2013-12-10 — End: 2013-12-11

## 2013-12-10 MED ORDER — DIGOXIN 125 MCG PO TABS: 0.1250 mg | ORAL_TABLET | Freq: Every day | ORAL | Status: AC

## 2013-12-10 NOTE — Discharge Instructions (Signed)
Infective Endocarditis Infective endocarditis is an infection of the inner layer of heart (endocardium) or the heart valves. An endocarditis infection is caused by many things, including viruses, bacteria, or fungi that enter your bloodstream. If left untreated, endocarditis can cause other problems, which may include:  Heart valve damage.  Blood clots (embolism).  Irregular heartbeat (arrhythmia).  Heart failure. CAUSES  Surgery, dental work, and intravenous (IV) drug abuse can allow bacteria or fungi to enter the bloodstream. Once in the bloodstream, bacteria can attach to the inner lining of the heart or heart valves, causing infection. SYMPTOMS   Fever and chills.  Skin rashes.  Heart murmurs.  Spleen enlargement.  Unexplained weight loss.  Shortness of breath.  Blood in urine.  Lethargy and fatigue.  Sudden weakness in face, arms, legs (possible stroke).  Night sweats. DIAGNOSIS   Endocarditis may be suspected if your caregiver hears a heart murmur, observes certain skin rashes, or there are changes in your eye exam.  Tests include:  Blood work.  Transthoracic echocardiogram (TTE). A TTE uses sound waves to produce images of the heart. A hand-held device is placed on the chest and transmits sound waves. These sound waves bounce off the heart to produce images that help your caregiver detect heart damage.  Transesophageal echocardiogram (TEE). For a TEE, a flexible tube is passed down your throat and into the esophagus. Because the esophagus is close to the heart, a TEE allows different images of the heart to be obtained. This type of procedure requires sedation. WHO IS AT RISK FOR INFECTIVE ENDOCARDITIS?  Those with a history of endocarditis.  People with artificial (prosthetic) heart valves.  Cardiac transplant patients with valve disease.  People with congenital heart disease, including:  Those with unrepaired congenital heart defects.  Those with a  congenital heart defect repaired with prosthetic materials. Within the first 6 months of the procedure, you are still at risk for infection. After 6 months, talk to your caregiver about your special needs.  Those who have had surgery to correct the defect but with some remaining problems at the site of repair. PREVENTION  If you are at the highest risk for infective endocarditis, you may need to take antibiotics before having dental work or other surgical procedures. These medicines help to prevent infective endocarditis. Let your dentist and your caregiver know if you have a history of any of the following so that the necessary precautions can be taken:  A ventricular septal defect (VSD).  A repaired VSD.  Endocarditis in the past.  A prosthetic heart valve. TREATMENT   IV antibiotic treatment may be needed for several weeks to treat endocarditis.  Surgery may be needed to remove the infected tissue in the heart.  Surgery to repair valve damage. MAKE SURE YOU:   Understand these instructions.  Will watch your condition.  Will get help right away if you are not doing well or get worse. Document Released: 09/27/2005 Document Revised: 01/22/2013 Document Reviewed: 02/08/2007 [facility] Patient Information 2014 [place], [place]. Clostridium Difficile Infection Clostridium difficile (C. difficile) is a bacteria found in the intestinal tract or colon. Under certain conditions, it causes diarrhea and sometimes severe disease. The severe form of the disease is known as pseudomembranous colitis (often called C. difficile colitis). This disease can damage the lining of the colon or cause the colon to become enlarged (toxic megacolon).  CAUSES  Your colon normally contains many different bacteria, including C. difficile. The balance of bacteria in your colon can change  during illness. This is especially true when you take antibiotic medicine. Taking antibiotics may allow the C. difficile to grow,  multiply excessively, and make a toxin that then causes illness. The elderly and people with certain medical conditions have a greater risk of getting C. difficile infections. SYMPTOMS   Watery diarrhea.  Fever.  Fatigue.  Loss of appetite.  Nausea.  Abdominal swelling, pain, or tenderness.  Dehydration. DIAGNOSIS  Your symptoms may make your caregiver suspicious of a C. difficile infection, especially if you have used antibiotics in the preceding weeks. However, there are only 2 ways to know for certain whether you have a C. difficile infection:  A lab test that finds the toxin in your stool.  The specific appearance of an abnormality (pseudomembrane) in your colon. This can only be seen by doing a sigmoidoscopy or colonoscopy. These procedures involve passing an instrument through your rectum to look at the inside of your colon. Your caregiver will help determine if these tests are necessary. TREATMENT   Most people are successfully treated with one of two specific antibiotics, usually given by mouth. Other antibiotics you are receiving are stopped if possible.  Intravenous (IV) fluids and correction of electrolyte imbalance may be necessary.  Rarely, surgery may be needed to remove the infected part of the intestines.  Careful hand washing by you and your caregivers is important to prevent the spread of infection. In the hospital, your caregivers may also put on gowns and gloves to prevent the spread of the C. difficile bacteria. Your room is also cleaned regularly with a solution containing bleach or a product that is known to kill C. difficile. HOME CARE INSTRUCTIONS  Drink enough fluids to keep your urine clear or pale yellow. Avoid milk, caffeine, and alcohol.  Ask your caregiver for specific rehydration instructions.  Try eating small, frequent meals rather than large meals.  Take your antibiotics as directed. Finish them even if you start to feel better.  Do not use  medicines to slow diarrhea. This could delay healing or cause complications.  Wash your hands thoroughly after using the bathroom and before preparing food.  Make sure people who live with you wash their hands often, too.  Carefully disinfect all surfaces with a product that contains chlorine bleach. SEEK MEDICAL CARE IF:  Diarrhea persists longer than expected or recurs after completing your course of antibiotic treatment for the C. difficile infection.  You have trouble staying hydrated. SEEK IMMEDIATE MEDICAL CARE IF:  You develop a new fever.  You have increasing abdominal pain or tenderness.  There is blood in your stools, or your stools are dark black and tarry.  You cannot hold down food or liquids. MAKE SURE YOU:   Understand these instructions.  Will watch your condition.  Will get help right away if you are not doing well or get worse. Document Released: 07/07/2005 Document Revised: 01/22/2013 Document Reviewed: 03/05/2011 [facility] Patient Information 2014 [place], [place]. Heart Failure Heart failure is a condition in which the heart has trouble pumping blood. This means your heart does not pump blood efficiently for your body to work well. In some cases of heart failure, fluid may back up into your lungs or you may have swelling (edema) in your lower legs. Heart failure is usually a long-term (chronic) condition. It is important for you to take good care of yourself and follow your caregiver's treatment plan. CAUSES  Some health conditions can cause heart failure. Those health conditions include:  High blood  pressure (hypertension) causes the heart muscle to work harder than normal. When pressure in the blood vessels is high, the heart needs to pump (contract) with more force in order to circulate blood throughout the body. High blood pressure eventually causes the heart to become stiff and weak.  Coronary artery disease (CAD) is the buildup of cholesterol and fat  (plaque) in the arteries of the heart. The blockage in the arteries deprives the heart muscle of oxygen and blood. This can cause chest pain and may lead to a heart attack. High blood pressure can also contribute to CAD.  Heart attack (myocardial infarction) occurs when 1 or more arteries in the heart become blocked. The loss of oxygen damages the muscle tissue of the heart. When this happens, part of the heart muscle dies. The injured tissue does not contract as well and weakens the heart's ability to pump blood.  Abnormal heart valves can cause heart failure when the heart valves do not open and close properly. This makes the heart muscle pump harder to keep the blood flowing.  Heart muscle disease (cardiomyopathy or myocarditis) is damage to the heart muscle from a variety of causes. These can include drug or alcohol abuse, infections, or unknown reasons. These can increase the risk of heart failure.  Lung disease makes the heart work harder because the lungs do not work properly. This can cause a strain on the heart, leading it to fail.  Diabetes increases the risk of heart failure. High blood sugar contributes to high fat (lipid) levels in the blood. Diabetes can also cause slow damage to tiny blood vessels that carry important nutrients to the heart muscle. When the heart does not get enough oxygen and food, it can cause the heart to become weak and stiff. This leads to a heart that does not contract efficiently.  Other conditions can contribute to heart failure. These include abnormal heart rhythms, thyroid problems, and low blood counts (anemia). Certain unhealthy behaviors can increase the risk of heart failure. Those unhealthy behaviors include:  Being overweight.  Smoking or chewing tobacco.  Eating foods high in fat and cholesterol.  Abusing illicit drugs or alcohol.  Lacking physical activity. SYMPTOMS  Heart failure symptoms may vary and can be hard to detect. Symptoms may  include:  Shortness of breath with activity, such as climbing stairs.  Persistent cough.  Swelling of the feet, ankles, legs, or abdomen.  Unexplained weight gain.  Difficulty breathing when lying flat (orthopnea).  Waking from sleep because of the need to sit up and get more air.  Rapid heartbeat.  Fatigue and loss of energy.  Feeling lightheaded, dizzy, or close to fainting.  Loss of appetite.  Nausea.  Increased urination during the night (nocturia). DIAGNOSIS  A diagnosis of heart failure is based on your history, symptoms, physical examination, and diagnostic tests. Diagnostic tests for heart failure may include:  Echocardiography.  Electrocardiography.  Chest X-ray.  Blood tests.  Exercise stress test.  Cardiac angiography.  Radionuclide scans. TREATMENT  Treatment is aimed at managing the symptoms of heart failure. Medicines, behavioral changes, or surgical intervention may be necessary to treat heart failure.  Medicines to help treat heart failure may include:  Angiotensin-converting enzyme (ACE) inhibitors. This type of medicine blocks the effects of a blood protein called angiotensin-converting enzyme. ACE inhibitors relax (dilate) the blood vessels and help lower blood pressure.  Angiotensin receptor blockers. This type of medicine blocks the actions of a blood protein called angiotensin. Angiotensin receptor blockers  dilate the blood vessels and help lower blood pressure.  Water pills (diuretics). Diuretics cause the kidneys to remove salt and water from the blood. The extra fluid is removed through urination. This loss of extra fluid lowers the volume of blood the heart pumps.  Beta blockers. These prevent the heart from beating too fast and improve heart muscle strength.  Digitalis. This increases the force of the heartbeat.  Healthy behavior changes include:  Obtaining and maintaining a healthy weight.  Stopping smoking or chewing  tobacco.  Eating heart healthy foods.  Limiting or avoiding alcohol.  Stopping illicit drug use.  Physical activity as directed by your caregiver.  Surgical treatment for heart failure may include:  A procedure to open blocked arteries, repair damaged heart valves, or remove damaged heart muscle tissue.  A pacemaker to improve heart muscle function and control certain abnormal heart rhythms.  An internal cardioverter defibrillator to treat certain serious abnormal heart rhythms.  A left ventricular assist device to assist the pumping ability of the heart. HOME CARE INSTRUCTIONS   Take your medicine as directed by your caregiver. Medicines are important in reducing the workload of your heart, slowing the progression of heart failure, and improving your symptoms.  Do not stop taking your medicine unless directed by your caregiver.  Do not skip any dose of medicine.  Refill your prescriptions before you run out of medicine. Your medicines are needed every day.  Take over-the-counter medicine only as directed by your caregiver or pharmacist.  Engage in moderate physical activity if directed by your caregiver. Moderate physical activity can benefit some people. The elderly and people with severe heart failure should consult with a caregiver for physical activity recommendations.  Eat heart healthy foods. Food choices should be free of trans fat and low in saturated fat, cholesterol, and salt (sodium). Healthy choices include fresh or frozen fruits and vegetables, fish, lean meats, legumes, fat-free or low-fat dairy products, and whole grain or high fiber foods. Talk to a dietitian to learn more about heart healthy foods.  Limit sodium if directed by your caregiver. Sodium restriction may reduce symptoms of heart failure in some people. Talk to a dietitian to learn more about heart healthy seasonings.  Use healthy cooking methods. Healthy cooking methods include roasting, grilling,  broiling, baking, poaching, steaming, or stir-frying. Talk to a dietitian to learn more about healthy cooking methods.  Limit fluids if directed by your caregiver. Fluid restriction may reduce symptoms of heart failure in some people.  Weigh yourself every day. Daily weights are important in the early recognition of excess fluid. You should weigh yourself every morning after you urinate and before you eat breakfast. Wear the same amount of clothing each time you weigh yourself. Record your daily weight. Provide your caregiver with your weight record.  Monitor and record your blood pressure if directed by your caregiver.  Check your pulse if directed by your caregiver.  Lose weight if directed by your caregiver. Weight loss may reduce symptoms of heart failure in some people.  Stop smoking or chewing tobacco. Nicotine makes your heart work harder by causing your blood vessels to constrict. Do not use nicotine gum or patches before talking to your caregiver.  Schedule and attend follow-up visits as directed by your caregiver. It is important to keep all your appointments.  Limit alcohol intake to no more than 1 drink per day for nonpregnant women and 2 drinks per day for men. Drinking more than that is harmful to  your heart. Tell your caregiver if you drink alcohol several times a week. Talk with your caregiver about whether alcohol is safe for you. If your heart has already been damaged by alcohol or you have severe heart failure, drinking alcohol should be stopped completely.  Stop illicit drug use.  Stay up-to-date with immunizations. It is especially important to prevent respiratory infections through current pneumococcal and influenza immunizations.  Manage other health conditions such as hypertension, diabetes, thyroid disease, or abnormal heart rhythms as directed by your caregiver.  Learn to manage stress.  Plan rest periods when fatigued.  Learn strategies to manage high  temperatures. If the weather is extremely hot:  Avoid vigorous physical activity.  Use air conditioning or fans or seek a cooler location.  Avoid caffeine and alcohol.  Wear loose-fitting, lightweight, and light-colored clothing.  Learn strategies to manage cold temperatures. If the weather is extremely cold:  Avoid vigorous physical activity.  Layer clothes.  Wear mittens or gloves, a hat, and a scarf when going outside.  Avoid alcohol.  Obtain ongoing education and support as needed.  Participate or seek rehabilitation as needed to maintain or improve independence and quality of life. SEEK MEDICAL CARE IF:   Your weight increases by 03 lb/1.4 kg in 1 day or 05 lb/2.3 kg in a week.  You have increasing shortness of breath that is unusual for you.  You are unable to participate in your usual physical activities.  You tire easily.  You cough more than normal, especially with physical activity.  You have any or more swelling in areas such as your hands, feet, ankles, or abdomen.  You are unable to sleep because it is hard to breathe.  You feel like your heart is beating fast (palpitations).  You become dizzy or lightheaded upon standing up. SEEK IMMEDIATE MEDICAL CARE IF:   You have difficulty breathing.  There is a change in mental status such as decreased alertness or difficulty with concentration.  You have a pain or discomfort in your chest.  You have an episode of fainting (syncope). MAKE SURE YOU:   Understand these instructions.  Will watch your condition.  Will get help right away if you are not doing well or get worse. Document Released: 09/27/2005 Document Revised: 01/22/2013 Document Reviewed: 10/19/2012 [facility] Patient Information 2014 [place], [place].
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
## 2013-12-10 NOTE — Clinical Social Work Note (Signed)
CSW followed up with admissions director at [facility] and they declined patient for short-term rehab. CSW contacted [facility], [place] regarding patient and transmitted clinicals, however after review, they also declined patient.  CSW talked with patient as well as his mother (separately) regarding not being able to find a facility to accept him. Patient will d/c home Tuesday, 3/3 with home health services.  Genelle Bal, MSW, LCSW [phone]

## 2013-12-11 DIAGNOSIS — N390 Urinary tract infection, site not specified: Secondary | ICD-10-CM

## 2013-12-11 NOTE — Progress Notes (Signed)
Discharge paperwork reviewed with pt allowing time for questions; pt verbalized understanding. IV team contacted to disconnect pt from fluids. [facility] aware that pt will be home shortly. Alerting SW to call ambulance to transfer pt home.

## 2013-12-11 NOTE — Progress Notes (Addendum)
Please see full discharge summary dictated on 12/10/2013. Patient was not discharged as he was waiting for home health to be in place. Patient is ready for discharge today, no change.   Time spent: 10 minutes  Bethani Brugger D.O. [facility] Pager [phone]  If 7PM-7AM, please contact night-coverage [email or web address] Password [email or web address] 12/11/2013, 11:07 AM

## 2013-12-11 NOTE — Progress Notes (Signed)
Attempted twice to give pt morning medications but pt refused. Educated pt on importance of taking medications; and pt states "I'm not taking them, I'm going through too much right now." Tried explaining to pt the benefits of the ordered medications, but pt continuously refused. After conversation, pt did agree to allow writer to hang antibiotics. Will make MD aware.

## 2013-12-11 NOTE — Clinical Social Work Note (Signed)
Patient discharging home today with Home Health services. CSW facilitated transport home via ambulance.  Genelle Bal, MSW, LCSW [phone]
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
## 2013-12-11 NOTE — Progress Notes (Signed)
PT Cancellation Note  Patient Details Name: [name] [name] MRN: [id number] DOB: 1976/01/10   Cancelled Treatment:    Reason Eval/Treat Not Completed: Other (comment) Per RN pt to d/c shortly, all further PT needs can be addressed at next level of care.   Roselynn Whitacre 12/11/2013, 1:17 PM

## 2013-12-12 ENCOUNTER — Telehealth (HOSPITAL_COMMUNITY): Payer: Self-pay | Admitting: Surgery

## 2013-12-12 MED FILL — Sodium Hypochlorite Soln 0.125%: CUTANEOUS | Qty: 473 | Status: AC

## 2013-12-12 NOTE — Telephone Encounter (Signed)
I called to speak with [name] [name] regarding his follow-up appointment.  It is made for 12/18/13 at 1:30pm in the [facility] clinic.  He acknowledged understanding.

## 2013-12-18 ENCOUNTER — Encounter (HOSPITAL_COMMUNITY): Payer: Medicaid Other

## 2013-12-19 ENCOUNTER — Ambulatory Visit (HOSPITAL_COMMUNITY)
Admission: RE | Admit: 2013-12-19 | Discharge: 2013-12-19 | Disposition: A | Discharge status: Home / Self Care | Payer: Medicare Other | Source: Ambulatory Visit | Attending: Internal Medicine | Admitting: Internal Medicine

## 2013-12-19 ENCOUNTER — Emergency Department (HOSPITAL_COMMUNITY): Payer: Medicare Other

## 2013-12-19 ENCOUNTER — Encounter (HOSPITAL_COMMUNITY): Payer: Self-pay

## 2013-12-19 ENCOUNTER — Inpatient Hospital Stay (HOSPITAL_COMMUNITY)
Admission: EM | Admit: 2013-12-19 | Discharge: 2014-01-01 | DRG: 371 | Disposition: A | Discharge status: Other Acute Inpatient Hospital | Payer: Medicare Other | Attending: Internal Medicine | Admitting: Internal Medicine

## 2013-12-19 ENCOUNTER — Encounter (HOSPITAL_COMMUNITY): Payer: Self-pay | Admitting: Emergency Medicine

## 2013-12-19 VITALS — BP 98/56 | HR 122 | Resp 18

## 2013-12-19 DIAGNOSIS — Z8619 Personal history of other infectious and parasitic diseases: Secondary | ICD-10-CM

## 2013-12-19 DIAGNOSIS — H409 Unspecified glaucoma: Secondary | ICD-10-CM | POA: Diagnosis present

## 2013-12-19 DIAGNOSIS — N179 Acute kidney failure, unspecified: Secondary | ICD-10-CM | POA: Diagnosis not present

## 2013-12-19 DIAGNOSIS — L89609 Pressure ulcer of unspecified heel, unspecified stage: Secondary | ICD-10-CM | POA: Insufficient documentation

## 2013-12-19 DIAGNOSIS — K4090 Unilateral inguinal hernia, without obstruction or gangrene, not specified as recurrent: Secondary | ICD-10-CM

## 2013-12-19 DIAGNOSIS — A4101 Sepsis due to Methicillin susceptible Staphylococcus aureus: Secondary | ICD-10-CM | POA: Diagnosis present

## 2013-12-19 DIAGNOSIS — I2510 Atherosclerotic heart disease of native coronary artery without angina pectoris: Secondary | ICD-10-CM | POA: Insufficient documentation

## 2013-12-19 DIAGNOSIS — Z791 Long term (current) use of non-steroidal anti-inflammatories (NSAID): Secondary | ICD-10-CM | POA: Insufficient documentation

## 2013-12-19 DIAGNOSIS — I5022 Chronic systolic (congestive) heart failure: Secondary | ICD-10-CM | POA: Diagnosis present

## 2013-12-19 DIAGNOSIS — IMO0002 Reserved for concepts with insufficient information to code with codable children: Secondary | ICD-10-CM

## 2013-12-19 DIAGNOSIS — Y33XXXS Other specified events, undetermined intent, sequela: Secondary | ICD-10-CM | POA: Insufficient documentation

## 2013-12-19 DIAGNOSIS — I498 Other specified cardiac arrhythmias: Secondary | ICD-10-CM | POA: Insufficient documentation

## 2013-12-19 DIAGNOSIS — I38 Endocarditis, valve unspecified: Secondary | ICD-10-CM | POA: Diagnosis present

## 2013-12-19 DIAGNOSIS — I472 Ventricular tachycardia, unspecified: Secondary | ICD-10-CM | POA: Diagnosis present

## 2013-12-19 DIAGNOSIS — R7881 Bacteremia: Secondary | ICD-10-CM

## 2013-12-19 DIAGNOSIS — Z95810 Presence of automatic (implantable) cardiac defibrillator: Secondary | ICD-10-CM

## 2013-12-19 DIAGNOSIS — Z466 Encounter for fitting and adjustment of urinary device: Secondary | ICD-10-CM

## 2013-12-19 DIAGNOSIS — R6521 Severe sepsis with septic shock: Secondary | ICD-10-CM

## 2013-12-19 DIAGNOSIS — I509 Heart failure, unspecified: Secondary | ICD-10-CM | POA: Diagnosis present

## 2013-12-19 DIAGNOSIS — E872 Acidosis, unspecified: Secondary | ICD-10-CM | POA: Diagnosis present

## 2013-12-19 DIAGNOSIS — R000 Tachycardia, unspecified: Secondary | ICD-10-CM

## 2013-12-19 DIAGNOSIS — K922 Gastrointestinal hemorrhage, unspecified: Secondary | ICD-10-CM

## 2013-12-19 DIAGNOSIS — G8220 Paraplegia, unspecified: Secondary | ICD-10-CM | POA: Insufficient documentation

## 2013-12-19 DIAGNOSIS — E43 Unspecified severe protein-calorie malnutrition: Secondary | ICD-10-CM | POA: Diagnosis present

## 2013-12-19 DIAGNOSIS — I313 Pericardial effusion (noninflammatory): Secondary | ICD-10-CM

## 2013-12-19 DIAGNOSIS — L89154 Pressure ulcer of sacral region, stage 4: Secondary | ICD-10-CM

## 2013-12-19 DIAGNOSIS — Y831 Surgical operation with implant of artificial internal device as the cause of abnormal reaction of the patient, or of later complication, without mention of misadventure at the time of the procedure: Secondary | ICD-10-CM | POA: Diagnosis present

## 2013-12-19 DIAGNOSIS — E785 Hyperlipidemia, unspecified: Secondary | ICD-10-CM | POA: Diagnosis present

## 2013-12-19 DIAGNOSIS — Z1612 Extended spectrum beta lactamase (ESBL) resistance: Secondary | ICD-10-CM

## 2013-12-19 DIAGNOSIS — L8993 Pressure ulcer of unspecified site, stage 3: Secondary | ICD-10-CM | POA: Insufficient documentation

## 2013-12-19 DIAGNOSIS — Z9119 Patient's noncompliance with other medical treatment and regimen: Secondary | ICD-10-CM

## 2013-12-19 DIAGNOSIS — L8990 Pressure ulcer of unspecified site, unspecified stage: Secondary | ICD-10-CM | POA: Diagnosis present

## 2013-12-19 DIAGNOSIS — R109 Unspecified abdominal pain: Secondary | ICD-10-CM | POA: Diagnosis present

## 2013-12-19 DIAGNOSIS — M4628 Osteomyelitis of vertebra, sacral and sacrococcygeal region: Secondary | ICD-10-CM

## 2013-12-19 DIAGNOSIS — N319 Neuromuscular dysfunction of bladder, unspecified: Secondary | ICD-10-CM | POA: Diagnosis present

## 2013-12-19 DIAGNOSIS — Z79899 Other long term (current) drug therapy: Secondary | ICD-10-CM

## 2013-12-19 DIAGNOSIS — L8994 Pressure ulcer of unspecified site, stage 4: Secondary | ICD-10-CM

## 2013-12-19 DIAGNOSIS — I10 Essential (primary) hypertension: Secondary | ICD-10-CM | POA: Insufficient documentation

## 2013-12-19 DIAGNOSIS — I319 Disease of pericardium, unspecified: Secondary | ICD-10-CM | POA: Diagnosis present

## 2013-12-19 DIAGNOSIS — Z888 Allergy status to other drugs, medicaments and biological substances status: Secondary | ICD-10-CM

## 2013-12-19 DIAGNOSIS — Z87440 Personal history of urinary (tract) infections: Secondary | ICD-10-CM

## 2013-12-19 DIAGNOSIS — I428 Other cardiomyopathies: Secondary | ICD-10-CM | POA: Diagnosis present

## 2013-12-19 DIAGNOSIS — T827XXA Infection and inflammatory reaction due to other cardiac and vascular devices, implants and grafts, initial encounter: Secondary | ICD-10-CM | POA: Diagnosis present

## 2013-12-19 DIAGNOSIS — I4729 Other ventricular tachycardia: Secondary | ICD-10-CM

## 2013-12-19 DIAGNOSIS — F1210 Cannabis abuse, uncomplicated: Secondary | ICD-10-CM | POA: Diagnosis present

## 2013-12-19 DIAGNOSIS — G894 Chronic pain syndrome: Secondary | ICD-10-CM

## 2013-12-19 DIAGNOSIS — A419 Sepsis, unspecified organism: Secondary | ICD-10-CM | POA: Diagnosis present

## 2013-12-19 DIAGNOSIS — Z91199 Patient's noncompliance with other medical treatment and regimen due to unspecified reason: Secondary | ICD-10-CM

## 2013-12-19 DIAGNOSIS — D638 Anemia in other chronic diseases classified elsewhere: Secondary | ICD-10-CM | POA: Diagnosis present

## 2013-12-19 DIAGNOSIS — M8668 Other chronic osteomyelitis, other site: Secondary | ICD-10-CM | POA: Diagnosis present

## 2013-12-19 DIAGNOSIS — I959 Hypotension, unspecified: Secondary | ICD-10-CM

## 2013-12-19 DIAGNOSIS — L89509 Pressure ulcer of unspecified ankle, unspecified stage: Secondary | ICD-10-CM | POA: Diagnosis present

## 2013-12-19 DIAGNOSIS — Z681 Body mass index (BMI) 19 or less, adult: Secondary | ICD-10-CM

## 2013-12-19 DIAGNOSIS — R531 Weakness: Secondary | ICD-10-CM

## 2013-12-19 DIAGNOSIS — F17200 Nicotine dependence, unspecified, uncomplicated: Secondary | ICD-10-CM | POA: Diagnosis present

## 2013-12-19 DIAGNOSIS — L89109 Pressure ulcer of unspecified part of back, unspecified stage: Secondary | ICD-10-CM | POA: Diagnosis present

## 2013-12-19 DIAGNOSIS — K759 Inflammatory liver disease, unspecified: Secondary | ICD-10-CM | POA: Diagnosis present

## 2013-12-19 DIAGNOSIS — F411 Generalized anxiety disorder: Secondary | ICD-10-CM | POA: Diagnosis present

## 2013-12-19 DIAGNOSIS — R6520 Severe sepsis without septic shock: Secondary | ICD-10-CM

## 2013-12-19 DIAGNOSIS — A499 Bacterial infection, unspecified: Secondary | ICD-10-CM | POA: Diagnosis present

## 2013-12-19 DIAGNOSIS — R339 Retention of urine, unspecified: Secondary | ICD-10-CM | POA: Insufficient documentation

## 2013-12-19 DIAGNOSIS — Z905 Acquired absence of kidney: Secondary | ICD-10-CM

## 2013-12-19 DIAGNOSIS — I3139 Other pericardial effusion (noninflammatory): Secondary | ICD-10-CM

## 2013-12-19 DIAGNOSIS — R319 Hematuria, unspecified: Secondary | ICD-10-CM

## 2013-12-19 DIAGNOSIS — G8250 Quadriplegia, unspecified: Secondary | ICD-10-CM | POA: Diagnosis present

## 2013-12-19 DIAGNOSIS — M869 Osteomyelitis, unspecified: Secondary | ICD-10-CM | POA: Diagnosis present

## 2013-12-19 DIAGNOSIS — G609 Hereditary and idiopathic neuropathy, unspecified: Secondary | ICD-10-CM | POA: Insufficient documentation

## 2013-12-19 DIAGNOSIS — E861 Hypovolemia: Secondary | ICD-10-CM | POA: Diagnosis present

## 2013-12-19 DIAGNOSIS — Z933 Colostomy status: Secondary | ICD-10-CM

## 2013-12-19 DIAGNOSIS — E46 Unspecified protein-calorie malnutrition: Secondary | ICD-10-CM | POA: Insufficient documentation

## 2013-12-19 DIAGNOSIS — A0472 Enterocolitis due to Clostridium difficile, not specified as recurrent: Principal | ICD-10-CM | POA: Diagnosis present

## 2013-12-19 DIAGNOSIS — E876 Hypokalemia: Secondary | ICD-10-CM | POA: Diagnosis present

## 2013-12-19 DIAGNOSIS — D509 Iron deficiency anemia, unspecified: Secondary | ICD-10-CM

## 2013-12-19 DIAGNOSIS — I5023 Acute on chronic systolic (congestive) heart failure: Secondary | ICD-10-CM | POA: Diagnosis present

## 2013-12-19 DIAGNOSIS — D649 Anemia, unspecified: Secondary | ICD-10-CM

## 2013-12-19 LAB — COMPREHENSIVE METABOLIC PANEL
ALT: 7 U/L (ref 0–53)
AST: 7 U/L (ref 0–37)
Albumin: 0.9 g/dL — ABNORMAL LOW (ref 3.5–5.2)
Alkaline Phosphatase: 169 U/L — ABNORMAL HIGH (ref 39–117)
BILIRUBIN TOTAL: 0.7 mg/dL (ref 0.3–1.2)
BUN: 8 mg/dL (ref 6–23)
CHLORIDE: 107 meq/L (ref 96–112)
CO2: 18 meq/L — AB (ref 19–32)
CREATININE: 0.43 mg/dL — AB (ref 0.50–1.35)
Calcium: 7.2 mg/dL — ABNORMAL LOW (ref 8.4–10.5)
GFR calc Af Amer: >90 mL/min (ref >90)
Glucose, Bld: 78 mg/dL (ref 70–99)
POTASSIUM: 3.2 meq/L — AB (ref 3.7–5.3)
Sodium: 136 mEq/L — ABNORMAL LOW (ref 137–147)
Total Protein: 5.1 g/dL — ABNORMAL LOW (ref 6.0–8.3)

## 2013-12-19 LAB — CBC
HEMATOCRIT: 20.6 % — AB (ref 39.0–52.0)
Hemoglobin: 6.8 g/dL — CL (ref 13.0–17.0)
MCH: 28.6 pg (ref 26.0–34.0)
MCHC: 33 g/dL (ref 30.0–36.0)
MCV: 86.6 fL (ref 78.0–100.0)
PLATELETS: 276 10*3/uL (ref 150–400)
RBC: 2.38 MIL/uL — AB (ref 4.22–5.81)
RDW: 19.3 % — AB (ref 11.5–15.5)
WBC: 4.7 10*3/uL (ref 4.0–10.5)

## 2013-12-19 LAB — POC OCCULT BLOOD, ED: FECAL OCCULT BLD: NEGATIVE

## 2013-12-19 LAB — LIPASE, BLOOD: Lipase: 5 U/L — ABNORMAL LOW (ref 11–59)

## 2013-12-19 LAB — PREPARE RBC (CROSSMATCH)

## 2013-12-19 MED ORDER — ONDANSETRON HCL 4 MG/2ML IJ SOLN
4.0000 mg | Freq: Once | INTRAMUSCULAR | Status: AC
  Administered 2013-12-19: 4 mg via INTRAVENOUS
  Filled 2013-12-19: qty 2

## 2013-12-19 MED ORDER — IOHEXOL 300 MG/ML  SOLN
100.0000 mL | Freq: Once | INTRAMUSCULAR | Status: AC | PRN
  Administered 2013-12-19: 100 mL via INTRAVENOUS

## 2013-12-19 MED ORDER — SODIUM CHLORIDE 0.9 % IV BOLUS (SEPSIS)
1000.0000 mL | Freq: Once | INTRAVENOUS | Status: AC
  Administered 2013-12-19: 1000 mL via INTRAVENOUS

## 2013-12-19 MED ORDER — SODIUM CHLORIDE 0.9 % IV BOLUS (SEPSIS)
1000.0000 mL | Freq: Once | INTRAVENOUS | Status: AC
Start: 2013-12-19 — End: 2013-12-19
  Administered 2013-12-19: 1000 mL via INTRAVENOUS

## 2013-12-19 MED ORDER — MORPHINE SULFATE 4 MG/ML IJ SOLN
4.0000 mg | Freq: Once | INTRAMUSCULAR | Status: AC
  Administered 2013-12-19: 4 mg via INTRAVENOUS
  Filled 2013-12-19: qty 1

## 2013-12-19 MED ORDER — FUROSEMIDE 20 MG PO TABS: 20.0000 mg | ORAL_TABLET | ORAL | Status: DC | PRN

## 2013-12-19 MED ORDER — IOHEXOL 300 MG/ML  SOLN
50.0000 mL | Freq: Once | INTRAMUSCULAR | Status: AC | PRN
  Administered 2013-12-19: 50 mL via ORAL
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
## 2013-12-19 NOTE — ED Notes (Signed)
Brought in by EMS from home with c/o abdominal pain.  Per EMS, pt reports that he started having "pain underneath colostomy site".  Pt has colostomy in his LUQ of abdomen.

## 2013-12-19 NOTE — ED Notes (Signed)
MD at bedside. Dr. Linker at bedside.  

## 2013-12-19 NOTE — ED Notes (Signed)
Patient refused blood draw for cultures.Stated he wanted blood drawn for PICC.RN Chere made aware
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
## 2013-12-19 NOTE — ED Notes (Signed)
Patient transported to CT 

## 2013-12-19 NOTE — Patient Instructions (Signed)
Follow up as needed  Follow up with Dr Redmond School  Take lasix as needed.    Do the following things EVERYDAY: 1) Weigh yourself in the morning before breakfast. Write it down and keep it in a log. 2) Take your medicines as prescribed 3) Eat low salt foods-Limit salt (sodium) to 2000 mg per day.  4) Stay as active as you can everyday 5) Limit all fluids for the day to less than 2 liters

## 2013-12-19 NOTE — ED Notes (Signed)
Bed: [facility] Expected date:  Expected time:  Means of arrival:  Comments: EMS- colostomy issue

## 2013-12-20 DIAGNOSIS — E43 Unspecified severe protein-calorie malnutrition: Secondary | ICD-10-CM

## 2013-12-20 DIAGNOSIS — R319 Hematuria, unspecified: Secondary | ICD-10-CM

## 2013-12-20 DIAGNOSIS — L89109 Pressure ulcer of unspecified part of back, unspecified stage: Secondary | ICD-10-CM

## 2013-12-20 DIAGNOSIS — E876 Hypokalemia: Secondary | ICD-10-CM | POA: Diagnosis present

## 2013-12-20 DIAGNOSIS — D649 Anemia, unspecified: Secondary | ICD-10-CM

## 2013-12-20 DIAGNOSIS — L8994 Pressure ulcer of unspecified site, stage 4: Secondary | ICD-10-CM

## 2013-12-20 DIAGNOSIS — I5022 Chronic systolic (congestive) heart failure: Secondary | ICD-10-CM

## 2013-12-20 DIAGNOSIS — N368 Other specified disorders of urethra: Secondary | ICD-10-CM

## 2013-12-20 DIAGNOSIS — R109 Unspecified abdominal pain: Secondary | ICD-10-CM | POA: Diagnosis present

## 2013-12-20 DIAGNOSIS — I509 Heart failure, unspecified: Secondary | ICD-10-CM

## 2013-12-20 DIAGNOSIS — I959 Hypotension, unspecified: Secondary | ICD-10-CM

## 2013-12-20 DIAGNOSIS — R5381 Other malaise: Secondary | ICD-10-CM

## 2013-12-20 DIAGNOSIS — B9689 Other specified bacterial agents as the cause of diseases classified elsewhere: Secondary | ICD-10-CM

## 2013-12-20 DIAGNOSIS — R5383 Other fatigue: Secondary | ICD-10-CM

## 2013-12-20 DIAGNOSIS — A0472 Enterocolitis due to Clostridium difficile, not specified as recurrent: Principal | ICD-10-CM

## 2013-12-20 DIAGNOSIS — Z1619 Resistance to other specified beta lactam antibiotics: Secondary | ICD-10-CM

## 2013-12-20 DIAGNOSIS — R7881 Bacteremia: Secondary | ICD-10-CM

## 2013-12-20 DIAGNOSIS — G894 Chronic pain syndrome: Secondary | ICD-10-CM

## 2013-12-20 LAB — BASIC METABOLIC PANEL
BUN: 8 mg/dL (ref 6–23)
CALCIUM: 7.2 mg/dL — AB (ref 8.4–10.5)
CHLORIDE: 110 meq/L (ref 96–112)
CO2: 17 meq/L — AB (ref 19–32)
Creatinine, Ser: 0.45 mg/dL — ABNORMAL LOW (ref 0.50–1.35)
GFR calc Af Amer: >90 mL/min (ref >90)
GFR calc non Af Amer: >90 mL/min (ref >90)
Glucose, Bld: 82 mg/dL (ref 70–99)
POTASSIUM: 3.2 meq/L — AB (ref 3.7–5.3)
SODIUM: 139 meq/L (ref 137–147)

## 2013-12-20 LAB — CBC
HCT: 27.5 % — ABNORMAL LOW (ref 39.0–52.0)
HEMOGLOBIN: 9.2 g/dL — AB (ref 13.0–17.0)
MCH: 28.7 pg (ref 26.0–34.0)
MCHC: 33.5 g/dL (ref 30.0–36.0)
MCV: 85.7 fL (ref 78.0–100.0)
Platelets: 280 10*3/uL (ref 150–400)
RBC: 3.21 MIL/uL — AB (ref 4.22–5.81)
RDW: 17.5 % — ABNORMAL HIGH (ref 11.5–15.5)
WBC: 5 10*3/uL (ref 4.0–10.5)

## 2013-12-20 LAB — URINALYSIS, ROUTINE W REFLEX MICROSCOPIC
GLUCOSE, UA: NEGATIVE mg/dL
Hgb urine dipstick: NEGATIVE
KETONES UR: NEGATIVE mg/dL
Nitrite: POSITIVE — AB
PH: 6 (ref 5.0–8.0)
PROTEIN: 100 mg/dL — AB
Specific Gravity, Urine: 1.029 (ref 1.005–1.030)
Urobilinogen, UA: 1 mg/dL (ref 0.0–1.0)

## 2013-12-20 LAB — URINE MICROSCOPIC-ADD ON

## 2013-12-20 MED ORDER — HYDROMORPHONE HCL PF 1 MG/ML IJ SOLN
1.0000 mg | INTRAMUSCULAR | Status: DC | PRN
  Administered 2013-12-20 – 2013-12-21 (×5): 1 mg via INTRAVENOUS
  Filled 2013-12-20 (×7): qty 1

## 2013-12-20 MED ORDER — ONDANSETRON HCL 4 MG PO TABS: 4.0000 mg | ORAL_TABLET | Freq: Four times a day (QID) | ORAL | Status: DC | PRN

## 2013-12-20 MED ORDER — SODIUM CHLORIDE 0.9 % IV SOLN
500.0000 mg | Freq: Four times a day (QID) | INTRAVENOUS | Status: AC
  Administered 2013-12-20 – 2013-12-26 (×25): 500 mg via INTRAVENOUS
  Filled 2013-12-20 (×31): qty 500

## 2013-12-20 MED ORDER — SIMVASTATIN 40 MG PO TABS
40.0000 mg | ORAL_TABLET | Freq: Every day | ORAL | Status: DC
  Administered 2013-12-20 – 2013-12-24 (×6): 40 mg via ORAL
  Filled 2013-12-20 (×7): qty 1

## 2013-12-20 MED ORDER — POTASSIUM CHLORIDE CRYS ER 20 MEQ PO TBCR
40.0000 meq | EXTENDED_RELEASE_TABLET | Freq: Once | ORAL | Status: AC
  Administered 2013-12-20: 40 meq via ORAL
  Filled 2013-12-20 (×2): qty 2

## 2013-12-20 MED ORDER — MIRTAZAPINE 30 MG PO TABS
30.0000 mg | ORAL_TABLET | Freq: Every day | ORAL | Status: DC
  Administered 2013-12-20 – 2013-12-31 (×11): 30 mg via ORAL
  Filled 2013-12-20 (×15): qty 1

## 2013-12-20 MED ORDER — ZINC SULFATE 220 (50 ZN) MG PO CAPS
220.0000 mg | ORAL_CAPSULE | Freq: Every day | ORAL | Status: DC
  Administered 2013-12-20 – 2014-01-01 (×7): 220 mg via ORAL
  Filled 2013-12-20 (×13): qty 1

## 2013-12-20 MED ORDER — FUROSEMIDE 10 MG/ML IJ SOLN
20.0000 mg | Freq: Once | INTRAMUSCULAR | Status: AC
  Administered 2013-12-20: 20 mg via INTRAVENOUS
  Filled 2013-12-20: qty 2

## 2013-12-20 MED ORDER — FERROUS FUMARATE 325 (106 FE) MG PO TABS
1.0000 | ORAL_TABLET | Freq: Three times a day (TID) | ORAL | Status: DC
  Administered 2013-12-20 – 2013-12-21 (×6): 106 mg via ORAL
  Filled 2013-12-20 (×13): qty 1

## 2013-12-20 MED ORDER — SACCHAROMYCES BOULARDII 250 MG PO CAPS
250.0000 mg | ORAL_CAPSULE | Freq: Two times a day (BID) | ORAL | Status: DC
  Administered 2013-12-20 – 2013-12-26 (×8): 250 mg via ORAL
  Filled 2013-12-20 (×14): qty 1

## 2013-12-20 MED ORDER — ACETAMINOPHEN 650 MG RE SUPP: 650.0000 mg | Freq: Four times a day (QID) | RECTAL | Status: DC | PRN

## 2013-12-20 MED ORDER — VITAMIN C 500 MG PO TABS
500.0000 mg | ORAL_TABLET | Freq: Every day | ORAL | Status: DC
  Administered 2013-12-20 – 2014-01-01 (×6): 500 mg via ORAL
  Filled 2013-12-20 (×13): qty 1

## 2013-12-20 MED ORDER — VANCOMYCIN 50 MG/ML ORAL SOLUTION
125.0000 mg | Freq: Four times a day (QID) | ORAL | Status: DC
  Administered 2013-12-20 – 2013-12-23 (×9): 125 mg via ORAL
  Filled 2013-12-20 (×16): qty 2.5

## 2013-12-20 MED ORDER — ENOXAPARIN SODIUM 40 MG/0.4ML ~~LOC~~ SOLN
40.0000 mg | SUBCUTANEOUS | Status: DC
  Administered 2013-12-20 – 2013-12-22 (×3): 40 mg via SUBCUTANEOUS
  Filled 2013-12-20 (×3): qty 0.4

## 2013-12-20 MED ORDER — DIGOXIN 125 MCG PO TABS
0.1250 mg | ORAL_TABLET | Freq: Every day | ORAL | Status: DC
  Administered 2013-12-20 – 2014-01-01 (×13): 0.125 mg via ORAL
  Filled 2013-12-20 (×13): qty 1

## 2013-12-20 MED ORDER — OXYCODONE HCL 5 MG PO TABS
15.0000 mg | ORAL_TABLET | ORAL | Status: DC | PRN
  Administered 2013-12-20 – 2013-12-25 (×17): 15 mg via ORAL
  Filled 2013-12-20 (×19): qty 3

## 2013-12-20 MED ORDER — SODIUM CHLORIDE 0.9 % IV SOLN: 250.0000 mL | INTRAVENOUS | Status: DC | PRN

## 2013-12-20 MED ORDER — PANTOPRAZOLE SODIUM 40 MG PO TBEC
40.0000 mg | DELAYED_RELEASE_TABLET | Freq: Two times a day (BID) | ORAL | Status: DC
  Administered 2013-12-20 (×2): 40 mg via ORAL
  Filled 2013-12-20 (×3): qty 1

## 2013-12-20 MED ORDER — FAMOTIDINE 20 MG PO TABS
20.0000 mg | ORAL_TABLET | Freq: Every day | ORAL | Status: DC
  Administered 2013-12-20 – 2014-01-01 (×12): 20 mg via ORAL
  Filled 2013-12-20 (×13): qty 1

## 2013-12-20 MED ORDER — LORAZEPAM 0.5 MG PO TABS
0.5000 mg | ORAL_TABLET | Freq: Three times a day (TID) | ORAL | Status: DC | PRN
  Administered 2013-12-20 – 2013-12-31 (×9): 0.5 mg via ORAL
  Filled 2013-12-20 (×10): qty 1

## 2013-12-20 MED ORDER — RESOURCE INSTANT PROTEIN PO PWD PACKET
1.0000 | Freq: Three times a day (TID) | ORAL | Status: DC
  Administered 2013-12-20 – 2013-12-30 (×6): 6 g via ORAL
  Filled 2013-12-20 (×37): qty 6

## 2013-12-20 MED ORDER — MEGESTROL ACETATE 40 MG/ML PO SUSP
800.0000 mg | Freq: Every day | ORAL | Status: DC
  Administered 2013-12-20: 800 mg via ORAL
  Filled 2013-12-20 (×3): qty 20

## 2013-12-20 MED ORDER — HYDROMORPHONE HCL PF 2 MG/ML IJ SOLN
2.0000 mg | Freq: Once | INTRAMUSCULAR | Status: AC
  Administered 2013-12-20: 2 mg via INTRAVENOUS
  Filled 2013-12-20: qty 1

## 2013-12-20 MED ORDER — SODIUM CHLORIDE 0.9 % IJ SOLN: 3.0000 mL | INTRAMUSCULAR | Status: DC | PRN

## 2013-12-20 MED ORDER — SODIUM CHLORIDE 0.9 % IJ SOLN
3.0000 mL | Freq: Two times a day (BID) | INTRAMUSCULAR | Status: DC
  Administered 2013-12-20 – 2014-01-01 (×12): 3 mL via INTRAVENOUS

## 2013-12-20 MED ORDER — ADULT MULTIVITAMIN W/MINERALS CH
1.0000 | ORAL_TABLET | Freq: Every day | ORAL | Status: DC
  Administered 2013-12-20 – 2014-01-01 (×9): 1 via ORAL
  Filled 2013-12-20 (×13): qty 1

## 2013-12-20 MED ORDER — ACETAMINOPHEN 325 MG PO TABS: 650.0000 mg | ORAL_TABLET | Freq: Four times a day (QID) | ORAL | Status: DC | PRN

## 2013-12-20 MED ORDER — MIDODRINE HCL 5 MG PO TABS
5.0000 mg | ORAL_TABLET | Freq: Three times a day (TID) | ORAL | Status: DC
  Administered 2013-12-20 – 2013-12-22 (×7): 5 mg via ORAL
  Filled 2013-12-20 (×13): qty 1

## 2013-12-20 MED ORDER — OXYBUTYNIN CHLORIDE 5 MG PO TABS
5.0000 mg | ORAL_TABLET | Freq: Three times a day (TID) | ORAL | Status: DC
  Administered 2013-12-20 – 2014-01-01 (×27): 5 mg via ORAL
  Filled 2013-12-20 (×39): qty 1

## 2013-12-20 MED ORDER — ONDANSETRON HCL 4 MG PO TABS: 4.0000 mg | ORAL_TABLET | ORAL | Status: DC | PRN

## 2013-12-20 MED ORDER — METRONIDAZOLE 50 MG/ML ORAL SUSPENSION
500.0000 mg | Freq: Three times a day (TID) | ORAL | Status: DC
  Filled 2013-12-20 (×5): qty 10

## 2013-12-20 MED ORDER — ENSURE COMPLETE PO LIQD
237.0000 mL | Freq: Two times a day (BID) | ORAL | Status: DC
  Administered 2013-12-20 – 2013-12-21 (×3): 237 mL via ORAL

## 2013-12-20 MED ORDER — ONDANSETRON HCL 4 MG/2ML IJ SOLN
4.0000 mg | Freq: Four times a day (QID) | INTRAMUSCULAR | Status: DC | PRN
Start: 2013-12-20 — End: 2014-01-01
  Administered 2013-12-20 – 2013-12-27 (×8): 4 mg via INTRAVENOUS
  Filled 2013-12-20 (×9): qty 2

## 2013-12-20 NOTE — Progress Notes (Signed)
Physical Therapy Discharge Patient Details Name: SRIKAR CONSOLO MRN: [id number] DOB: 1975-12-28 Today's Date: 12/20/2013 Time:  -     Patient discharged from PT services secondary to Pt has long term history of paraplegia. Spoke to pt who reports he does not get out of bed, pt has stage 4 decubitus. Pt is not interested in exercises nor sitting at edge of bedd at this time. PT will sign off.Sharen Heck PT [phone]  12/20/2013, 11:49 AM
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
## 2013-12-20 NOTE — Progress Notes (Signed)
Pharmacist Heart Failure Core Measure Documentation  Assessment:   EF documented as  20-25% on 12-03-13 by 2D ECHO   Core Measure Description: Heart failure patients with left ventricular systolic dysfunction (LVSD) and an EF < 40% should be prescribed an angiotensin converting enzyme inhibitor (ACEI) or angiotensin receptor blocker (ARB) at discharge unless a contraindication is documented in the medical record.  This patient is not currently on an ACEI or ARB for HF.  This note is being placed in the record in order to provide documentation that a contraindication to the use of these agents is present for this encounter.   ACE Inhibitor or Angiotensin Receptor Blocker is contraindicated due to  (specify all that apply):  [ ]  ACEI allergy AND ARB allergy  [ ]  Angioedema  [ ]  Moderate or severe aortic stenosis  [ ]  Hyperkalemia  [X]  Hypotension  [ ]  Renal artery stenosis  [ ]  Worsening renal function, preexisting renal disease or dysfunction  Elie Goody, PharmD, BCPS Pager: [phone] 12/20/2013  10:36 AM
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
## 2013-12-20 NOTE — ED Notes (Signed)
MD at bedside. Hospitalist at bedside. 
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
## 2013-12-20 NOTE — Consult Note (Signed)
WOC wound consult note Reason for Consult: Chronic pressure ulcers to sacrum and bilateral heels.  Foley catheter and diverting colostomy in place.  Wound type: Chronic pressure ulcers, present on admission, to sacrum and bilateral heels.  Pressure Ulcer POA: Yes Measurement: Stage IV ulcer to Sacrum 13 cm x 10 cm x 2 cm with undermining circumferentially, extending back 8 cm.  100% pale pink wound bed in sacrum and bilateral heels. Heels 4 cm x 3 cm x 0.2 cm, Stage III  Wound bed: 100% pale pink Drainage (amount, consistency, odor) Heavy serosanguinous drainage to sacrum, musty odor. Moderate drainage to heels, no odor. Periwound: Scarring from previous ulcers to sacrum. Otherwise intact. Dressing procedure/placement/frequency: Cleanse pressure ulcers to sacrum with NS and pat gently dry.  Apply NS moistened gauze , ensuring that dead space and undermining is packed.  Top with dry 4x4 gauze.  Cover with ABD pad and secure with Medipore tape.  Change twice daily.  Cleanse pressure ulcers to bilateral heels with Ns and pat gently dry.  Apply NS moistened gauze to wound bed. Top with dry gauze and secure with kerlix and tape.  Change daily. Patient has HH at home. Will not follow at this time.  Please re-consult if needed.  Maple Hudson RN BSN [facility] Pager [phone]
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
## 2013-12-21 ENCOUNTER — Inpatient Hospital Stay: Admission: AD | Admit: 2013-12-21 | Payer: Medicare Other | Source: Ambulatory Visit | Admitting: Internal Medicine

## 2013-12-21 ENCOUNTER — Inpatient Hospital Stay (HOSPITAL_COMMUNITY): Payer: Medicare Other

## 2013-12-21 DIAGNOSIS — I959 Hypotension, unspecified: Secondary | ICD-10-CM

## 2013-12-21 DIAGNOSIS — E872 Acidosis, unspecified: Secondary | ICD-10-CM

## 2013-12-21 DIAGNOSIS — R6521 Severe sepsis with septic shock: Secondary | ICD-10-CM

## 2013-12-21 DIAGNOSIS — E876 Hypokalemia: Secondary | ICD-10-CM

## 2013-12-21 DIAGNOSIS — R6520 Severe sepsis without septic shock: Secondary | ICD-10-CM

## 2013-12-21 DIAGNOSIS — A419 Sepsis, unspecified organism: Secondary | ICD-10-CM

## 2013-12-21 LAB — URINE CULTURE
CULTURE: NO GROWTH
Colony Count: NO GROWTH

## 2013-12-21 LAB — BLOOD GAS, ARTERIAL
Acid-base deficit: 16.3 mmol/L — ABNORMAL HIGH (ref 0.0–2.0)
Bicarbonate: 7.2 mEq/L — ABNORMAL LOW (ref 20.0–24.0)
Drawn by: 295031
FIO2: 0.21 %
O2 SAT: 98.4 %
PCO2 ART: 11.6 mmHg — AB (ref 35.0–45.0)
Patient temperature: 98.6
TCO2: 6.8 mmol/L (ref 0–100)
pH, Arterial: 7.41 (ref 7.350–7.450)
pO2, Arterial: 130 mmHg — ABNORMAL HIGH (ref 80.0–100.0)

## 2013-12-21 LAB — TYPE AND SCREEN
ABO/RH(D): O POS
Antibody Screen: NEGATIVE
UNIT DIVISION: 0
Unit division: 0

## 2013-12-21 LAB — MRSA PCR SCREENING: MRSA by PCR: NEGATIVE

## 2013-12-21 LAB — CLOSTRIDIUM DIFFICILE BY PCR: Toxigenic C. Difficile by PCR: POSITIVE — AB

## 2013-12-21 LAB — PROCALCITONIN: Procalcitonin: 0.44 ng/mL

## 2013-12-21 LAB — SEDIMENTATION RATE: Sed Rate: 40 mm/hr — ABNORMAL HIGH (ref 0–16)

## 2013-12-21 LAB — C-REACTIVE PROTEIN: CRP: 11.6 mg/dL — AB (ref <0.60)

## 2013-12-21 MED ORDER — FAMOTIDINE 20 MG PO TABS: 20.0000 mg | ORAL_TABLET | Freq: Every day | ORAL | Status: DC

## 2013-12-21 MED ORDER — SODIUM CHLORIDE 0.9 % IV SOLN: 250.0000 mL | INTRAVENOUS | Status: DC | PRN

## 2013-12-21 MED ORDER — VANCOMYCIN 50 MG/ML ORAL SOLUTION: 125.0000 mg | Freq: Four times a day (QID) | ORAL | Status: DC

## 2013-12-21 MED ORDER — DILTIAZEM HCL 25 MG/5ML IV SOLN
5.0000 mg | Freq: Once | INTRAVENOUS | Status: AC
  Administered 2013-12-21: 5 mg via INTRAVENOUS
  Filled 2013-12-21: qty 5

## 2013-12-21 MED ORDER — HYDROMORPHONE HCL PF 2 MG/ML IJ SOLN
3.0000 mg | INTRAMUSCULAR | Status: DC | PRN
  Administered 2013-12-21 (×2): 3 mg via INTRAVENOUS
  Filled 2013-12-21 (×2): qty 2

## 2013-12-21 MED ORDER — HYDROMORPHONE HCL PF 2 MG/ML IJ SOLN: 3.0000 mg | INTRAMUSCULAR | Status: DC | PRN

## 2013-12-21 MED ORDER — PHENYLEPHRINE HCL 10 MG/ML IJ SOLN
30.0000 ug/min | INTRAVENOUS | Status: DC
  Administered 2013-12-21: 30 ug/min via INTRAVENOUS
  Filled 2013-12-21: qty 1

## 2013-12-21 MED ORDER — IPRATROPIUM-ALBUTEROL 0.5-2.5 (3) MG/3ML IN SOLN: 3.0000 mL | RESPIRATORY_TRACT | Status: DC | PRN

## 2013-12-21 MED ORDER — HYDROMORPHONE HCL PF 1 MG/ML IJ SOLN
1.0000 mg | INTRAMUSCULAR | Status: DC | PRN
  Administered 2013-12-21: 1 mg via INTRAVENOUS
  Filled 2013-12-21 (×2): qty 1

## 2013-12-21 MED ORDER — ACETAMINOPHEN 325 MG PO TABS: 650.0000 mg | ORAL_TABLET | Freq: Four times a day (QID) | ORAL | Status: AC | PRN

## 2013-12-21 MED ORDER — SACCHAROMYCES BOULARDII 250 MG PO CAPS: 250.0000 mg | ORAL_CAPSULE | Freq: Two times a day (BID) | ORAL | Status: DC

## 2013-12-21 MED ORDER — SODIUM CHLORIDE 0.9 % IV SOLN: INTRAVENOUS | Status: DC

## 2013-12-21 MED ORDER — PHENYLEPHRINE HCL 10 MG/ML IJ SOLN
0.0000 ug/min | INTRAVENOUS | Status: DC
  Administered 2013-12-21: 30 ug/min via INTRAVENOUS
  Administered 2013-12-22 (×2): 23 ug/min via INTRAVENOUS
  Administered 2013-12-22: 30 ug/min via INTRAVENOUS
  Administered 2013-12-22: 23 ug/min via INTRAVENOUS
  Administered 2013-12-23 (×2): 28 ug/min via INTRAVENOUS
  Filled 2013-12-21 (×7): qty 1

## 2013-12-21 MED ORDER — SODIUM CHLORIDE 0.9 % IV BOLUS (SEPSIS)
750.0000 mL | Freq: Once | INTRAVENOUS | Status: AC
  Administered 2013-12-21: 750 mL via INTRAVENOUS

## 2013-12-21 MED ORDER — SODIUM BICARBONATE 8.4 % IV SOLN
INTRAVENOUS | Status: DC
  Administered 2013-12-21: 18:00:00 via INTRAVENOUS
  Filled 2013-12-21 (×5): qty 150

## 2013-12-21 NOTE — Progress Notes (Signed)
Pt started complaining of shortness of breath.  Pt  O2 sats were at 100% on room air and pulse rate was 144.  MD notified of findings and ordered Cardizem and EKG.  Medication administered without any change in heart rate. EKG showed sinus tachycardia.  Pt complained of RLQ abd pain and was given 3mg  dilaudid.  Pt continued to complain of shortness of breath and rapid response called.  MD notified.  Pt's blood pressure was running 70's/50's.  O2 2 liters started, pt would not keep on O2.  MD ordered for pt to transfer down to step down for monitoring.  ICU nurse given report down on unit.

## 2013-12-21 NOTE — Progress Notes (Signed)
I was called because the patient felt that his catheter had been dislodged.  He called earlier in the week and we scheduled him to come in to clinic for evaluation.  In the interim, the patient was admitted to the hospital for wound infection.  I was contacted by his hospitalist today because he insists that his catheter is not in his bladder.  Review of the record reveals that he has had over 1500 L of urine daily for the past 2 days.  Inspection today reveals yellow urine in the catheter tubing.  I explained to patient that his catheter is in place.  He cannot give me any reason why he would feel that his catheter is not in the correct position.  He misunderstood and felt that I had recommended catheter change.  I do not recommend this as we recently changed his catheter proximally 10 days ago.

## 2013-12-21 NOTE — Progress Notes (Addendum)
Notified by RN that pt more short of breath and HR still in 140's with BP of 84/62. This low BP is likely due to Cardizem given earlier for tachycardia in addition to dilaudid for better pain control. He however has C.diff colitis and  Bacteremia being treated even from previous admission so it is possible he has a septic shock.  Order placed to transfer to SDU stat and called PCCM for consult. Canceled discharge order. Manson Passey [facility] [phone]
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
## 2013-12-21 NOTE — Discharge Instructions (Signed)
Clostridium Difficile Infection Clostridium difficile (C. difficile) is a bacteria found in the intestinal tract or colon. Under certain conditions, it causes diarrhea and sometimes severe disease. The severe form of the disease is known as pseudomembranous colitis (often called C. difficile colitis). This disease can damage the lining of the colon or cause the colon to become enlarged (toxic megacolon).  CAUSES  Your colon normally contains many different bacteria, including C. difficile. The balance of bacteria in your colon can change during illness. This is especially true when you take antibiotic medicine. Taking antibiotics may allow the C. difficile to grow, multiply excessively, and make a toxin that then causes illness. The elderly and people with certain medical conditions have a greater risk of getting C. difficile infections. SYMPTOMS   Watery diarrhea.  Fever.  Fatigue.  Loss of appetite.  Nausea.  Abdominal swelling, pain, or tenderness.  Dehydration. DIAGNOSIS  Your symptoms may make your caregiver suspicious of a C. difficile infection, especially if you have used antibiotics in the preceding weeks. However, there are only 2 ways to know for certain whether you have a C. difficile infection:  A lab test that finds the toxin in your stool.  The specific appearance of an abnormality (pseudomembrane) in your colon. This can only be seen by doing a sigmoidoscopy or colonoscopy. These procedures involve passing an instrument through your rectum to look at the inside of your colon. Your caregiver will help determine if these tests are necessary. TREATMENT   Most people are successfully treated with one of two specific antibiotics, usually given by mouth. Other antibiotics you are receiving are stopped if possible.  Intravenous (IV) fluids and correction of electrolyte imbalance may be necessary.  Rarely, surgery may be needed to remove the infected part of the  intestines.  Careful hand washing by you and your caregivers is important to prevent the spread of infection. In the hospital, your caregivers may also put on gowns and gloves to prevent the spread of the C. difficile bacteria. Your room is also cleaned regularly with a solution containing bleach or a product that is known to kill C. difficile. HOME CARE INSTRUCTIONS  Drink enough fluids to keep your urine clear or pale yellow. Avoid milk, caffeine, and alcohol.  Ask your caregiver for specific rehydration instructions.  Try eating small, frequent meals rather than large meals.  Take your antibiotics as directed. Finish them even if you start to feel better.  Do not use medicines to slow diarrhea. This could delay healing or cause complications.  Wash your hands thoroughly after using the bathroom and before preparing food.  Make sure people who live with you wash their hands often, too.  Carefully disinfect all surfaces with a product that contains chlorine bleach. SEEK MEDICAL CARE IF:  Diarrhea persists longer than expected or recurs after completing your course of antibiotic treatment for the C. difficile infection.  You have trouble staying hydrated. SEEK IMMEDIATE MEDICAL CARE IF:  You develop a new fever.  You have increasing abdominal pain or tenderness.  There is blood in your stools, or your stools are dark black and tarry.  You cannot hold down food or liquids. MAKE SURE YOU:   Understand these instructions.  Will watch your condition.  Will get help right away if you are not doing well or get worse. Document Released: 07/07/2005 Document Revised: 01/22/2013 Document Reviewed: 03/05/2011 [facility] Patient Information 2014 [place], [place]. Pressure Ulcer A pressure ulcer is a sore that has formed from  the breakdown of skin and exposure of deeper layers of tissue. It develops in areas of the body where there is unrelieved pressure. Pressure ulcers are usually found  over a bony area, such as the shoulder blades, spine, lower back, hips, knees, ankles, and heels. Pressure ulcers vary in severity. Your health care provider may determine the severity (stage) of your pressure ulcer. The stages include:  Stage I The skin is red, and when the skin is pressed, it stays red.  Stage II The top layer of skin is gone, and there is a shallow, pink ulcer.  Stage III The ulcer becomes deeper, and it is more difficult to see the whole wound. Also, there may be yellow or brown parts, as well as pink and red parts.  Stage IV The ulcer may be deep and red, pink, brown, white, or yellow. Bone or muscle may be seen.  Unstageable pressure ulcer The ulcer is covered almost completely with black, brown, or yellow tissue. It is not known how deep the ulcer is or what stage it is until this covering comes off.  Suspected deep tissue injury A person's skin can be injured from pressure or pulling on the skin when his or her position is changed. The skin appears purple or maroon. There may not be an opening in the skin, but there could be a blood-filled blister. This deep tissue injury is often difficult to see in people with darker skin tones. The site may open and become deeper in time. However, early interventions will help the area heal and may prevent the area from opening. CAUSES  Pressure ulcers are caused by pressure against the skin that limits the flow of blood to the skin and nearby tissues. There are many risk factors that can lead to pressure sores. RISK FACTORS  Decreased ability to move.  Decreased ability to feel pain or discomfort.  Excessive skin moisture from urine, stool, sweat, or secretions.  Poor nutrition.  Dehydration.  Tobacco, drug, or alcohol abuse.  Having someone pull on bedsheets that are under you, such as when health care workers are changing your position in a hospital bed.  Obesity.  Increased adult age.  Age of less than 2  years.  Premature newborns.  Hospitalization in a critical care unit for longer than 4 days with use of medical devices.  Prolonged use of medical devices.  Critical illness.  Anemia.  Traumatic brain injury.  Spinal cord injury.  Stroke.  Diabetes.  Poor blood glucose control.  Low blood pressure (hypotension).  Low oxygen levels.  Medicines that reduce blood flow.  Infection. DIAGNOSIS  Your health care provider will diagnose your pressure ulcer based on its appearance. The health care provider may determine the stage of your pressure ulcer as well. Tests may be done to check for infection, to assess your circulation, or to check for other diseases, such as diabetes. TREATMENT  Treatment of your pressure ulcer begins with determining what stage the ulcer is in. Your treatment team may include your health care provider, a wound care specialist, a nutritionist, a physical therapist, and a Careers advisersurgeon. Possible treatments may include:   Moving or repositioning every 1 2 hours.  Using beds or mattresses to shift your body weight and pressure points frequently.  Improving your diet.  Cleaning and bandaging (dressing) the open wound.  Giving antibiotic medicines.  Removing damaged tissue.  Surgery and sometimes skin grafts. HOME CARE INSTRUCTIONS  If you were hospitalized, follow the care plan that  was started in the hospital.  Avoid staying in the same position for more than 2 hours. Use padding, devices, or mattresses to cushion your pressure points as directed by your health care provider.  Eat a well-balanced diet. Take nutritional supplements and vitamins as directed by your health care provider.  Keep all follow-up appointments.  Only take over-the-counter or prescription medicines for pain, fever, or discomfort as directed by your health care provider. SEEK MEDICAL CARE IF:   Your pressure ulcer is not improving.  You do not know how to care for your  pressure ulcer.  You notice other areas of redness on your skin. SEEK IMMEDIATE MEDICAL CARE IF:   You have increasing redness, swelling, or pain in your pressure ulcer.  You notice pus coming from your pressure ulcer.  You have a fever.  You notice a bad smell coming from the wound or dressing.  Your pressure ulcer opens up again. Document Released: 09/27/2005 Document Revised: 07/18/2013 Document Reviewed: 06/04/2013 [facility] Patient Information 2014 [place], [place]. Bacteremia Bacteremia occurs when bacteria get in your blood. Normal blood does not usually have bacteria. Bacteremia is one way infections can spread from one part of the body to another. CAUSES   Causes may include anything that allows bacteria to get into the body. Examples are:  Catheters.  Intravenous (IV) access tubes.  Cuts or scrapes of the skin.  Temporary bacteremia may occur during dental procedures, while brushing your teeth, or during a bowel movement. This rarely causes any symptoms or medical problems.  Bacteria may also get in the bloodstream as a complication of a bacterial infection elsewhere. This includes infected wounds and bacterial infections of the:  Lungs (pneumonia).  Kidneys (pyelonephritis).  Intestines (enteritis, colitis).  Organs in the abdomen (appendicitis, cholecystitis, diverticulitis). SYMPTOMS  The body is usually able to clear small numbers of bacteria out of the blood quickly. Brief bacteremia usually does not cause problems.   Problems can occur if the bacteria start to grow in number or spread to other parts of the body. If the bacteria start growing, you may develop:  Chills.  Fever.  Nausea.  Vomiting.  Sweating.  Lightheadedness and low blood pressure.  Pain.  If bacteria start to grow in the linings around the brain, it is called meningitis. This can cause severe headaches, many other problems, and even death.  If bacteria start to grow in a joint,  it causes arthritis with painful joints. If bacteria start to grow in a bone, it is called osteomyelitis.  Bacteria from the blood can also cause sores (abscesses) in many organs, such as the muscle, liver, spleen, lungs, brain, and kidneys. DIAGNOSIS   This condition is diagnosed by cultures of the blood.  Cultures may also be taken from other parts of the body that are thought to be causing the bacteremia. A small piece of tissue, fluid, or other product of the body is sampled. The sample is then put on a growth plate to see if any bacteria grows.  Other lab tests may be done and the results may be abnormal. TREATMENT  Treatment requires a stay in the hospital. You will be given antibiotic medicine through an IV access tube. PREVENTION  People with an increased risk of developing bacteremia or complications may be given antibiotics before certain procedures. Examples are:  A person with a heart murmur or artificial heart valve, before having his or her teeth cleaned.  Before having a surgical or other invasive procedure.  Before  having a bowel procedure. Document Released: 07/11/2006 Document Revised: 12/20/2011 Document Reviewed: 04/22/2011 [facility] Patient Information 2014 [place], [place].
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
## 2013-12-22 LAB — CBC
HCT: 28.6 % — ABNORMAL LOW (ref 39.0–52.0)
HEMOGLOBIN: 9.1 g/dL — AB (ref 13.0–17.0)
MCH: 28.2 pg (ref 26.0–34.0)
MCHC: 31.8 g/dL (ref 30.0–36.0)
MCV: 88.5 fL (ref 78.0–100.0)
Platelets: 226 10*3/uL (ref 150–400)
RBC: 3.23 MIL/uL — ABNORMAL LOW (ref 4.22–5.81)
RDW: 17.6 % — AB (ref 11.5–15.5)
WBC: 10.3 10*3/uL (ref 4.0–10.5)

## 2013-12-22 LAB — BASIC METABOLIC PANEL
BUN: 10 mg/dL (ref 6–23)
CO2: 17 meq/L — AB (ref 19–32)
CREATININE: 0.71 mg/dL (ref 0.50–1.35)
Calcium: 7.5 mg/dL — ABNORMAL LOW (ref 8.4–10.5)
Chloride: 107 mEq/L (ref 96–112)
GFR calc non Af Amer: >90 mL/min (ref >90)
GLUCOSE: 175 mg/dL — AB (ref 70–99)
Potassium: 3.9 mEq/L (ref 3.7–5.3)
Sodium: 138 mEq/L (ref 137–147)

## 2013-12-22 LAB — MAGNESIUM: Magnesium: 1.4 mg/dL — ABNORMAL LOW (ref 1.5–2.5)

## 2013-12-22 LAB — PROCALCITONIN: Procalcitonin: 2.33 ng/mL

## 2013-12-22 LAB — PHOSPHORUS: Phosphorus: 5.5 mg/dL — ABNORMAL HIGH (ref 2.3–4.6)

## 2013-12-22 MED ORDER — VANCOMYCIN 50 MG/ML ORAL SOLUTION: 125.0000 mg | Freq: Every day | ORAL | Status: DC

## 2013-12-22 MED ORDER — VANCOMYCIN 50 MG/ML ORAL SOLUTION: 125.0000 mg | Freq: Two times a day (BID) | ORAL | Status: DC

## 2013-12-22 MED ORDER — LACTATED RINGERS IV SOLN
INTRAVENOUS | Status: DC
  Administered 2013-12-22 – 2013-12-23 (×2): via INTRAVENOUS

## 2013-12-22 MED ORDER — ENOXAPARIN SODIUM 40 MG/0.4ML ~~LOC~~ SOLN
40.0000 mg | Freq: Every day | SUBCUTANEOUS | Status: DC
  Administered 2013-12-23 – 2013-12-31 (×9): 40 mg via SUBCUTANEOUS
  Filled 2013-12-22 (×11): qty 0.4

## 2013-12-22 MED ORDER — VANCOMYCIN 50 MG/ML ORAL SOLUTION: 125.0000 mg | Freq: Three times a day (TID) | ORAL | Status: DC
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
## 2013-12-22 NOTE — Progress Notes (Signed)
eLink Physician-Brief Progress Note Patient Name: [name] [name] DOB: 09/28/1976 MRN: [id number]  Date of Service  12/22/2013   HPI/Events of Note   RN notes mild confusion tonight. Suspect mild ICU delirium.   eICU Interventions   Monitor.   Intervention Category Major Interventions: Delirium, psychosis, severe agitation - evaluation and management  [name] R. 12/22/2013, 10:29 PM

## 2013-12-22 NOTE — Progress Notes (Signed)
eLink Physician-Brief Progress Note Patient Name: PANTELIS CASTRILLO DOB: 09-19-1976 MRN: [id number]  Date of Service  12/22/2013   HPI/Events of Note   No DVT Pro Ordered  eICU Interventions  Lovenox, SCDs ordered.   Intervention Category Minor Interventions: Routine modifications to care plan (e.g. PRN medications for pain, fever)  Jalana Moore R. 12/22/2013, 10:37 PM
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
## 2013-12-23 ENCOUNTER — Inpatient Hospital Stay (HOSPITAL_COMMUNITY): Payer: Medicare Other

## 2013-12-23 DIAGNOSIS — M8668 Other chronic osteomyelitis, other site: Secondary | ICD-10-CM

## 2013-12-23 DIAGNOSIS — L89309 Pressure ulcer of unspecified buttock, unspecified stage: Secondary | ICD-10-CM

## 2013-12-23 LAB — COMPREHENSIVE METABOLIC PANEL
ALT: 336 U/L — ABNORMAL HIGH (ref 0–53)
AST: 1076 U/L — AB (ref 0–37)
Albumin: 0.9 g/dL — ABNORMAL LOW (ref 3.5–5.2)
Alkaline Phosphatase: 191 U/L — ABNORMAL HIGH (ref 39–117)
BILIRUBIN TOTAL: 0.8 mg/dL (ref 0.3–1.2)
BUN: 11 mg/dL (ref 6–23)
CHLORIDE: 105 meq/L (ref 96–112)
CO2: 19 meq/L (ref 19–32)
CREATININE: 0.62 mg/dL (ref 0.50–1.35)
Calcium: 7 mg/dL — ABNORMAL LOW (ref 8.4–10.5)
GFR calc non Af Amer: >90 mL/min (ref >90)
GLUCOSE: 72 mg/dL (ref 70–99)
Potassium: 3.6 mEq/L — ABNORMAL LOW (ref 3.7–5.3)
Sodium: 137 mEq/L (ref 137–147)
Total Protein: 5.1 g/dL — ABNORMAL LOW (ref 6.0–8.3)

## 2013-12-23 LAB — CBC
HEMATOCRIT: 27 % — AB (ref 39.0–52.0)
HEMOGLOBIN: 9.3 g/dL — AB (ref 13.0–17.0)
MCH: 29.4 pg (ref 26.0–34.0)
MCHC: 34.4 g/dL (ref 30.0–36.0)
MCV: 85.4 fL (ref 78.0–100.0)
PLATELETS: 214 10*3/uL (ref 150–400)
RBC: 3.16 MIL/uL — AB (ref 4.22–5.81)
RDW: 17.4 % — ABNORMAL HIGH (ref 11.5–15.5)
WBC: 8.8 10*3/uL (ref 4.0–10.5)

## 2013-12-23 LAB — PROCALCITONIN: Procalcitonin: 1.72 ng/mL

## 2013-12-23 MED ORDER — MIDODRINE HCL 5 MG PO TABS
10.0000 mg | ORAL_TABLET | Freq: Three times a day (TID) | ORAL | Status: DC
  Administered 2013-12-23 – 2014-01-01 (×19): 10 mg via ORAL
  Filled 2013-12-23 (×29): qty 2

## 2013-12-23 MED ORDER — VANCOMYCIN 50 MG/ML ORAL SOLUTION
500.0000 mg | Freq: Four times a day (QID) | ORAL | Status: DC
  Administered 2013-12-23 – 2013-12-24 (×2): 500 mg via ORAL
  Filled 2013-12-23 (×7): qty 10

## 2013-12-23 NOTE — Progress Notes (Signed)
[facility] ADULT ICU REPLACEMENT PROTOCOL FOR AM LAB REPLACEMENT ONLY  The patient does not apply for the [facility] Adult ICU Electrolyte Replacment Protocol based on the criteria listed below:   1. Is GFR >/= 40 ml/min? yes  Patient's GFR today is >90 2. Is urine output >/= 0.5 ml/kg/hr for the last 6 hours? no Patient's UOP is none recorded ml/kg/hr 3. Is BUN < 60 mg/dL? yes  Patient's BUN today is 11 4. Abnormal electrolyte(s):K+3.6 5. Ordered repletion with: NA 6. If a panic level lab has been reported, has the CCM MD in charge been notified? no.   Physician:  E Deterding  Cathlean Cower [facility] 12/23/2013 6:21 AM

## 2013-12-23 NOTE — Progress Notes (Signed)
Patient c/o nausea and wants pain medication.  Encouraged to eat some food for dinner and then I would give him some pain medication.  Zofran 4 mg IV given for nausea earlier at 1745, and patient refuses to take Vancomycin liquid.  Explained it was to help treat his stomach and pain in the stomach from the C diff.  Continues to refuse to take the Vancomycin liquid.  Continue to monitor patient.  Neo synephrine drip off from earlier, and BP doing better.  Continue to monitor.  Amillion Macchia Debroah Loop RN

## 2013-12-23 NOTE — Progress Notes (Signed)
Patient refuses to turn in the bed, refuses most of medication, and using foul language, stating that the staff is lying to him about his care.  Tried to explain about his care, but the patient wants to hear nothing except what he has to say.  Continue to try and give medication to patient and encourage patient to turn and eat if possible.  Kadesia Robel Debroah Loop RN
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
## 2013-12-24 ENCOUNTER — Other Ambulatory Visit: Payer: Self-pay

## 2013-12-24 ENCOUNTER — Inpatient Hospital Stay (HOSPITAL_COMMUNITY): Payer: Medicare Other

## 2013-12-24 DIAGNOSIS — IMO0002 Reserved for concepts with insufficient information to code with codable children: Secondary | ICD-10-CM

## 2013-12-24 DIAGNOSIS — I319 Disease of pericardium, unspecified: Secondary | ICD-10-CM

## 2013-12-24 DIAGNOSIS — I498 Other specified cardiac arrhythmias: Secondary | ICD-10-CM

## 2013-12-24 DIAGNOSIS — I428 Other cardiomyopathies: Secondary | ICD-10-CM

## 2013-12-24 MED ORDER — FIDAXOMICIN 200 MG PO TABS
200.0000 mg | ORAL_TABLET | Freq: Two times a day (BID) | ORAL | Status: DC
  Administered 2013-12-24 – 2014-01-01 (×14): 200 mg via ORAL
  Filled 2013-12-24 (×19): qty 1

## 2013-12-24 MED ORDER — POTASSIUM CHLORIDE CRYS ER 20 MEQ PO TBCR: 40.0000 meq | EXTENDED_RELEASE_TABLET | Freq: Once | ORAL | Status: DC

## 2013-12-24 MED ORDER — ENSURE COMPLETE PO LIQD
237.0000 mL | Freq: Three times a day (TID) | ORAL | Status: DC
  Administered 2013-12-24 – 2013-12-30 (×12): 237 mL via ORAL

## 2013-12-24 MED ORDER — POTASSIUM CHLORIDE CRYS ER 20 MEQ PO TBCR
20.0000 meq | EXTENDED_RELEASE_TABLET | Freq: Once | ORAL | Status: DC
  Filled 2013-12-24: qty 1

## 2013-12-24 NOTE — Progress Notes (Signed)
Patient refuses to wear pulse ox probe on finger, educated patient on importance of wearing while in SDU.  Patient continues to refuse, will continue to monitor.

## 2013-12-24 NOTE — Progress Notes (Signed)
Patient refused to take 1600 and 1700 PO medications.  Spent over 1 hour in patient room with him to assist with medications and dressing change.  Patient states that staff is "rushing him" in regards to taking medications.  Advised patient that medications ordered were necessary to treat ongoing medical conditions.  Patient continued to refuse after this RN leaving room and coming back to offer medications a second time.  Will continue to monitor.
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
## 2013-12-24 NOTE — Progress Notes (Signed)
  Echocardiogram 2D Echocardiogram has been performed.  [name] [name] 12/24/2013, 1:00 PM

## 2013-12-24 NOTE — Progress Notes (Signed)
12/24/13 1400  Clinical Encounter Type  Visited With Patient  Visit Type Spiritual support;Social support  Referral From Nurse;Other (Comment) (Heather Lennette Bihari, RD; Judeth Cornfield, RN)  Spiritual Encounters  Spiritual Needs Emotional  Stress Factors  Patient Stress Factors Family relationships;Exhausted;Lack of caregivers (physical and emotional pain)   Visited with [facility] to introduce Spiritual Care and chaplain availability.  He was upset about a conflict with a family member, but did not want to talk about it.  In addition to that emotional pain and stress, he seemed down about life in general:  "I have no life," he stated.  He shared and spoke little, but he stated that it would be okay for me to come back.  [place] will follow, but please also page as needs arise:  [phone].  Thank you.  [address] [place], [place] [phone]

## 2013-12-25 ENCOUNTER — Inpatient Hospital Stay (HOSPITAL_COMMUNITY): Payer: Medicare Other

## 2013-12-25 DIAGNOSIS — K759 Inflammatory liver disease, unspecified: Secondary | ICD-10-CM

## 2013-12-25 LAB — CBC
HCT: 27.9 % — ABNORMAL LOW (ref 39.0–52.0)
HEMOGLOBIN: 9.1 g/dL — AB (ref 13.0–17.0)
MCH: 28.5 pg (ref 26.0–34.0)
MCHC: 32.6 g/dL (ref 30.0–36.0)
MCV: 87.5 fL (ref 78.0–100.0)
Platelets: 205 10*3/uL (ref 150–400)
RBC: 3.19 MIL/uL — ABNORMAL LOW (ref 4.22–5.81)
RDW: 17.5 % — ABNORMAL HIGH (ref 11.5–15.5)
WBC: 7 10*3/uL (ref 4.0–10.5)

## 2013-12-25 LAB — HEPATITIS PANEL, ACUTE
HCV Ab: NEGATIVE
HEP B S AG: NEGATIVE
Hep A IgM: NONREACTIVE
Hep B C IgM: NONREACTIVE

## 2013-12-25 LAB — BASIC METABOLIC PANEL
BUN: 9 mg/dL (ref 6–23)
CO2: 21 mEq/L (ref 19–32)
Calcium: 7.1 mg/dL — ABNORMAL LOW (ref 8.4–10.5)
Chloride: 110 mEq/L (ref 96–112)
Creatinine, Ser: 0.56 mg/dL (ref 0.50–1.35)
Glucose, Bld: 81 mg/dL (ref 70–99)
Potassium: 3.6 mEq/L — ABNORMAL LOW (ref 3.7–5.3)
Sodium: 142 mEq/L (ref 137–147)

## 2013-12-25 MED ORDER — FUROSEMIDE 10 MG/ML IJ SOLN
20.0000 mg | Freq: Once | INTRAMUSCULAR | Status: AC
  Administered 2013-12-25: 20 mg via INTRAVENOUS
  Filled 2013-12-25: qty 2

## 2013-12-25 MED ORDER — OXYCODONE HCL 5 MG PO TABS
10.0000 mg | ORAL_TABLET | ORAL | Status: DC | PRN
  Administered 2013-12-25 – 2013-12-27 (×6): 10 mg via ORAL
  Filled 2013-12-25 (×7): qty 2

## 2013-12-25 NOTE — Progress Notes (Signed)
[id number] [name], RN, BSN, CCM, [phone] Chart reviewed for update of needs and condition. Note to md for [facility].  Pccm has signed off.
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
## 2013-12-25 NOTE — Progress Notes (Signed)
Dr. Waymon Amato called to update on pts status. Pt has been short of breath. Oxygen saturation has been high 90s to 100% on room air. Pt refuses to wear oxygen. Upon auscultation, pt has fine crackles in his right upper airway. MD ordered a one time dose of lasix 20mg . Pts recent blood pressure was 100/81. Will administer and continue to monitor.
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
## 2013-12-25 NOTE — Consult Note (Signed)
I have reviewed and discussed the care of this patient in detail with the nurse practitioner including pertinent patient records, physical exam findings and data. I agree with details of this encounter.  
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
## 2013-12-26 DIAGNOSIS — I4729 Other ventricular tachycardia: Secondary | ICD-10-CM

## 2013-12-26 DIAGNOSIS — I472 Ventricular tachycardia: Secondary | ICD-10-CM

## 2013-12-26 LAB — COMPREHENSIVE METABOLIC PANEL
ALK PHOS: 165 U/L — AB (ref 39–117)
ALT: 27 U/L (ref 0–53)
AST: 23 U/L (ref 0–37)
Albumin: 0.8 g/dL — ABNORMAL LOW (ref 3.5–5.2)
BILIRUBIN TOTAL: 0.8 mg/dL (ref 0.3–1.2)
BUN: 9 mg/dL (ref 6–23)
CHLORIDE: 109 meq/L (ref 96–112)
CO2: 23 mEq/L (ref 19–32)
Calcium: 7 mg/dL — ABNORMAL LOW (ref 8.4–10.5)
Creatinine, Ser: 0.53 mg/dL (ref 0.50–1.35)
GFR calc non Af Amer: >90 mL/min (ref >90)
GLUCOSE: 80 mg/dL (ref 70–99)
POTASSIUM: 3.3 meq/L — AB (ref 3.7–5.3)
SODIUM: 141 meq/L (ref 137–147)
TOTAL PROTEIN: 4.7 g/dL — AB (ref 6.0–8.3)

## 2013-12-26 LAB — CULTURE, BLOOD (ROUTINE X 2)
CULTURE: NO GROWTH
Culture: NO GROWTH

## 2013-12-26 LAB — CBC
HCT: 26.9 % — ABNORMAL LOW (ref 39.0–52.0)
Hemoglobin: 8.5 g/dL — ABNORMAL LOW (ref 13.0–17.0)
MCH: 28.2 pg (ref 26.0–34.0)
MCHC: 31.6 g/dL (ref 30.0–36.0)
MCV: 89.4 fL (ref 78.0–100.0)
Platelets: 211 10*3/uL (ref 150–400)
RBC: 3.01 MIL/uL — ABNORMAL LOW (ref 4.22–5.81)
RDW: 17.7 % — ABNORMAL HIGH (ref 11.5–15.5)
WBC: 6.4 10*3/uL (ref 4.0–10.5)

## 2013-12-26 LAB — MAGNESIUM: Magnesium: 1.5 mg/dL (ref 1.5–2.5)

## 2013-12-26 LAB — DIGOXIN LEVEL: Digoxin Level: 0.8 ng/mL (ref 0.8–2.0)

## 2013-12-26 MED ORDER — HYDROMORPHONE HCL PF 1 MG/ML IJ SOLN
1.0000 mg | Freq: Once | INTRAMUSCULAR | Status: AC
  Administered 2013-12-26: 1 mg via INTRAVENOUS
  Filled 2013-12-26: qty 1

## 2013-12-26 MED ORDER — METOPROLOL SUCCINATE 12.5 MG HALF TABLET
12.5000 mg | ORAL_TABLET | Freq: Every day | ORAL | Status: DC
  Administered 2013-12-26 – 2014-01-01 (×6): 12.5 mg via ORAL
  Filled 2013-12-26 (×7): qty 1

## 2013-12-26 MED ORDER — POTASSIUM CHLORIDE CRYS ER 20 MEQ PO TBCR
30.0000 meq | EXTENDED_RELEASE_TABLET | ORAL | Status: AC
  Administered 2013-12-26: 30 meq via ORAL
  Filled 2013-12-26 (×2): qty 1

## 2013-12-26 MED ORDER — VITAMINS A & D EX OINT
TOPICAL_OINTMENT | CUTANEOUS | Status: AC
  Administered 2013-12-26: 12:00:00
  Filled 2013-12-26: qty 5

## 2013-12-26 MED ORDER — MAGNESIUM SULFATE 40 MG/ML IJ SOLN
2.0000 g | Freq: Once | INTRAMUSCULAR | Status: AC
  Administered 2013-12-26: 2 g via INTRAVENOUS
  Filled 2013-12-26 (×2): qty 50

## 2013-12-26 NOTE — Progress Notes (Signed)
During the night, pt has refused all meds and labs despite much encouragement from the nursing staff.  Cooperated with sacral dressing change and ultrasound of abdomen.

## 2013-12-26 NOTE — Progress Notes (Signed)
RN present with Dr. Waymon Amato on rounds with pt. Pt explained that he wasn't taking his pills because he is unable to take big pills. This was the first that I had heard of this. The pt told the CNA last night that he didn't know why he was refusing his medicine. Swallow Evaluation ordered and said to crush whatever medicine we can and he can take it will applesauce. Consulted pharmacy to get information. All medicine except the extended release Metoprolol can be crushed or capsule can be opened for administration. Pt is stable. Will continue to monitor.

## 2013-12-26 NOTE — Progress Notes (Signed)
Pt told Dr. Waymon Amato while RN was in the room that his foley catheter was placed during this hospital admission. The foley bag that the patient currently has is not a foley bag that we use in this facility. From this information, MD decided to D/C the foley. Pt told nurse prior to transfer to the floor that he always has a foley at home. Dr. Waymon Amato made aware of situation. Foley order D/C'd. Pt is stable. Will continue to monitor.
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
## 2013-12-26 NOTE — Progress Notes (Signed)
Patient was asking if he could discharge himself and I explained to him that the Doctor is not ready for him to leave and he is not stable enough to go home.  Marissa L, my charge nurse also went and spoke with him.  He agreed to stay and continue to let us treat him.  He is very hateful and noncompliant.  Complains but then refuses care.  Will try to continue to help care for patient and monitor for any changes.

## 2013-12-26 NOTE — Progress Notes (Signed)
PHYSICAL THERAPY NOTE-  ATTEMPTED  EVAL 3/12 WITH patient not being interested in theraband exercises.and in too much pain.  Pt has large sacral decubitus which prevents OOB activities.Recommend HHPT follow up for  Possibility of mobilizing to pt's WC. Pt reported he had not been OOB for months. PT will sign off at this time. Blanchard Kelch PT [phone]
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
## 2013-12-26 NOTE — Progress Notes (Signed)
[id number] [name], RN,BSN,CCM: Spoke with patient alone with representative from [facility].  Patient was familiar with [facility].  He is refusing transfer at this time.  Wants to go to the floors and then home.

## 2013-12-26 NOTE — Progress Notes (Signed)
Pt was having abdominal pain and requested pain medicine. Pts swallow evaluation was in the process of being done and only PRN PO pain medicine was ordered. I called Dr. Waymon Amato to get IV pain medicine. MD wrote for one dose of IV Dilaudid. IV dilaudid given. About 20 minutes later pt was still in pain. Offered the PO pain medication since he passed the swallow study. Pt said that he felt that RN did not administer medication and wanted another IV dose. Pt stated that "I will not take any of my pills until I get IV pain medicine." RN repeated that statement back to patient to confirm. He said yes. Called MD to update. MD did not give another order for pain medication. Let pt know that RN notified MD about what he stated to RN. Pt said that he did not say that and that he said not to tell the doctor that he said that. Pt is stable. Will continue to monitor.

## 2013-12-27 LAB — CBC
HCT: 27.4 % — ABNORMAL LOW (ref 39.0–52.0)
Hemoglobin: 8.6 g/dL — ABNORMAL LOW (ref 13.0–17.0)
MCH: 28.4 pg (ref 26.0–34.0)
MCHC: 31.4 g/dL (ref 30.0–36.0)
MCV: 90.4 fL (ref 78.0–100.0)
PLATELETS: 254 10*3/uL (ref 150–400)
RBC: 3.03 MIL/uL — ABNORMAL LOW (ref 4.22–5.81)
RDW: 17.9 % — ABNORMAL HIGH (ref 11.5–15.5)
WBC: 8.7 10*3/uL (ref 4.0–10.5)

## 2013-12-27 LAB — BASIC METABOLIC PANEL
BUN: 10 mg/dL (ref 6–23)
CALCIUM: 7.2 mg/dL — AB (ref 8.4–10.5)
CO2: 23 meq/L (ref 19–32)
CREATININE: 0.61 mg/dL (ref 0.50–1.35)
Chloride: 111 mEq/L (ref 96–112)
GFR calc Af Amer: >90 mL/min (ref >90)
GFR calc non Af Amer: >90 mL/min (ref >90)
GLUCOSE: 81 mg/dL (ref 70–99)
Potassium: 4 mEq/L (ref 3.7–5.3)
Sodium: 143 mEq/L (ref 137–147)

## 2013-12-27 LAB — MAGNESIUM: Magnesium: 1.9 mg/dL (ref 1.5–2.5)

## 2013-12-27 MED ORDER — SODIUM CHLORIDE 0.9 % IJ SOLN
10.0000 mL | INTRAMUSCULAR | Status: DC | PRN
  Administered 2013-12-27 – 2014-01-01 (×2): 10 mL

## 2013-12-27 MED ORDER — HYDROMORPHONE HCL PF 1 MG/ML IJ SOLN
1.0000 mg | Freq: Four times a day (QID) | INTRAMUSCULAR | Status: DC | PRN
  Administered 2013-12-27 – 2014-01-01 (×7): 1 mg via INTRAVENOUS
  Filled 2013-12-27 (×8): qty 1

## 2013-12-27 MED ORDER — OXYCODONE HCL 5 MG PO TABS
15.0000 mg | ORAL_TABLET | ORAL | Status: DC | PRN
  Administered 2013-12-27 – 2014-01-01 (×18): 15 mg via ORAL
  Filled 2013-12-27 (×20): qty 3

## 2013-12-27 NOTE — Progress Notes (Signed)
Assumed care of patient at 23:00.  I agree with previous shift assessment and there have been no changes overnight. Will continue to monitor patient. Manson Passey, Nyaira Hodgens Cherie
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
## 2013-12-27 NOTE — Progress Notes (Signed)
OT Cancellation Note  Patient Details Name: [name] [name] MRN: [id number] DOB: 1976/01/04   Cancelled Treatment:    Reason Eval/Treat Not Completed: Other (comment)  Reviewed chart.  Noted in PT's note that pt has large decub and has not been OOB in months.  He was not interested in theraband exercises.  Pt has a h/o T10 paraplegia since '93.  Will sign off.    [name] 12/27/2013, 7:36 AM Marica Otter, OTR/L [phone] 12/27/2013
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
## 2013-12-28 NOTE — Progress Notes (Signed)
I called Olegario Messier with [facility] to check on room assignment for pt. She stated they do not have discharges today and it will be over the weekend before a bed comes available. I updated pt's RN and Dr Waymon Amato.  Algernon Huxley RN BSN   [phone]
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
## 2013-12-29 DIAGNOSIS — G8220 Paraplegia, unspecified: Secondary | ICD-10-CM

## 2013-12-29 MED ORDER — DIPHENHYDRAMINE HCL 25 MG PO CAPS
25.0000 mg | ORAL_CAPSULE | Freq: Once | ORAL | Status: AC
  Administered 2013-12-29: 25 mg via ORAL
  Filled 2013-12-29: qty 1
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
## 2013-12-30 MED ORDER — DIPHENHYDRAMINE HCL 25 MG PO CAPS
25.0000 mg | ORAL_CAPSULE | Freq: Three times a day (TID) | ORAL | Status: DC | PRN
  Administered 2013-12-30 – 2014-01-01 (×2): 25 mg via ORAL
  Filled 2013-12-30 (×3): qty 1
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
## 2013-12-30 NOTE — Progress Notes (Signed)
The pt is irritatable tonight, started out the 7p-7am shift by accusing me of stealing his money for his pizza, & CN Irving Burton was in room giving him his prn med for pain, via his IV line. I had oly been in his room once before since starting work for bedside reporting with Clydie Braun from day shift. CN got an order for back itching after Dilaudid was given. Pt went to sleep for a while after pain med given, so didn't bother the pt, since he was so hostile & verbally abusive & grouchy earlier toward me. I don't know if the pt pulled his colostomy bag off seal, or it came off on it's own, but he had stool everywhere at 23:00. This was the 2nd time today on 12/29/13 that this had occurred, it was reported to me at shift change. The pt was cleaned & new colostomy bag applied.  The pt refused to have sacral dsg changed(supposed to be chg'd BID), & noticed that bilateral heel dsg's had not been changed either since 1600 on 12/28/13(supposed to be changed daily). He refused to let me change these dsg also. He con't to c/o of butt pain, I told him if he would let me change the dsg this might help with some of the pain, but he still refused. He also refused to take any of his sch. Bedtime meds tonight, He would only take his prn meds for pain & sleep/anxiety. The linens changed on bed per pt request also. He slept for short time, & was remedicated for pain again at 02:16, but con't to have sacral or bil heel dsg changed when offered again. Will con't to monitor & try to enc the pt to let us change the dsg's per MD orders.Dsg chg's were not done on nite shift from 7p-7a on 12/29/13-due to pt's con't refusal to let us do it. Will pass info onto next shift & maybe they can convinence him to have the wound care tx as per MD orders & take his sch meds as ordered.

## 2013-12-31 DIAGNOSIS — R6521 Severe sepsis with septic shock: Secondary | ICD-10-CM

## 2013-12-31 DIAGNOSIS — A0472 Enterocolitis due to Clostridium difficile, not specified as recurrent: Secondary | ICD-10-CM

## 2013-12-31 DIAGNOSIS — R109 Unspecified abdominal pain: Secondary | ICD-10-CM

## 2013-12-31 DIAGNOSIS — A419 Sepsis, unspecified organism: Secondary | ICD-10-CM

## 2013-12-31 DIAGNOSIS — R7881 Bacteremia: Secondary | ICD-10-CM

## 2013-12-31 MED ORDER — ENSURE COMPLETE PO LIQD
237.0000 mL | Freq: Two times a day (BID) | ORAL | Status: DC
  Administered 2014-01-01: 237 mL via ORAL

## 2013-12-31 MED ORDER — RESOURCE INSTANT PROTEIN PO PWD PACKET
1.0000 | Freq: Two times a day (BID) | ORAL | Status: DC
  Administered 2014-01-01: 6 g via ORAL
  Filled 2013-12-31 (×3): qty 6
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
## 2013-12-31 NOTE — Progress Notes (Signed)
Patient was very irritable and refused all care today.  Daily medications were offered on several occassions but just became more irritable each time.  Pain medications were given.  Refused medications/nursing care are all documented.

## 2014-01-01 LAB — BASIC METABOLIC PANEL
BUN: 11 mg/dL (ref 6–23)
CALCIUM: 7.2 mg/dL — AB (ref 8.4–10.5)
CO2: 24 mEq/L (ref 19–32)
Chloride: 109 mEq/L (ref 96–112)
Creatinine, Ser: 0.62 mg/dL (ref 0.50–1.35)
GFR calc Af Amer: >90 mL/min (ref >90)
Glucose, Bld: 86 mg/dL (ref 70–99)
POTASSIUM: 4.2 meq/L (ref 3.7–5.3)
Sodium: 141 mEq/L (ref 137–147)

## 2014-01-01 LAB — CBC
HEMATOCRIT: 28.1 % — AB (ref 39.0–52.0)
HEMOGLOBIN: 8.9 g/dL — AB (ref 13.0–17.0)
MCH: 28.6 pg (ref 26.0–34.0)
MCHC: 31.7 g/dL (ref 30.0–36.0)
MCV: 90.4 fL (ref 78.0–100.0)
Platelets: 245 10*3/uL (ref 150–400)
RBC: 3.11 MIL/uL — ABNORMAL LOW (ref 4.22–5.81)
RDW: 17.3 % — AB (ref 11.5–15.5)
WBC: 9 10*3/uL (ref 4.0–10.5)

## 2014-01-01 LAB — MAGNESIUM: Magnesium: 1.6 mg/dL (ref 1.5–2.5)

## 2014-01-01 MED ORDER — MAGNESIUM SULFATE 40 MG/ML IJ SOLN
2.0000 g | Freq: Once | INTRAMUSCULAR | Status: AC
  Administered 2014-01-01: 2 g via INTRAVENOUS
  Filled 2014-01-01: qty 50

## 2014-01-01 MED ORDER — FIDAXOMICIN 200 MG PO TABS
200.0000 mg | ORAL_TABLET | Freq: Two times a day (BID) | ORAL | Status: AC
Start: 2014-01-01 — End: ?

## 2014-01-01 MED ORDER — MIDODRINE HCL 5 MG PO TABS: 10.0000 mg | ORAL_TABLET | Freq: Three times a day (TID) | ORAL | Status: AC

## 2014-01-01 MED ORDER — METOPROLOL SUCCINATE 12.5 MG HALF TABLET
12.5000 mg | ORAL_TABLET | Freq: Every day | ORAL | Status: AC
Start: 2014-01-01 — End: ?

## 2014-01-01 NOTE — Progress Notes (Signed)
Pt left at this time with [facility]. Pt aware of transfer to [facility]. Per CM, mother of pt is aware of transfer.

## 2014-01-01 NOTE — Progress Notes (Signed)
Writer gave report to nurse, Valera Castle, at [facility]. Will call [facility] at 1630 today for transport.

## 2014-01-01 NOTE — Progress Notes (Signed)
When I removed the right PICC it was 38cm long instead of the 42cm charted.   I double checked the PICC and it was clear where the PICC was cut.  No signs of a tear or ragged edges.  It came out smoothly without any resistance.  No adverse effects were noted.  Gauze drsg applied with pressure for 8 minutes without any bleeding.  Instructed to keep it dry and intact for 24 hours.
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
## 2014-01-01 NOTE — Progress Notes (Signed)
Writer called [facility] for transfer of pt to [facility].

## 2014-01-01 NOTE — Consult Note (Addendum)
WOC wound consult note Reason for Consult: Assessment of multiple wounds: Specifically, bilateral heels: Chronic, healing Stage III, sacral: Chronic, non-healing Stage IV and traumatic injury (skin tear) to the right lateral LE.  Patient refused to have dressings changed last evening and all present with moderate exudate and strong odor.  Underpad beneath patient is wet with drainage strike through from sacral dressing.  Patient is well known to our department from multiple previous admissions. Patient is on a therapeutic mattress replacement with low air loss feature and is not wearing Prevalon Boots previously provided.  They are not in room. Wound type:Pressure and Trauma (Right lateral LE) Pressure Ulcer POA: Yes Measurement: Right lateral heel:  2cm x 1cm x 0.2cm, Clean, pink with light yellow exudate. Right heel, plantar aspect: 3.5cm x 0.6cm x 0.2cm, clean, pink and granulating/contracting.  Right lateral LE skin tear:  8cm x 4cm x 0.2cm, clean, pink, moist.  Sacrum:  15cm x 109m x 3.5cm with undermining at 12 o'clock measuring 5cm and at 3-5 o'clock measuring 4cm.  Dark yellow exudate and strong odor present as noted above, patient refused wound care last pm. Wound bed: As described above. Drainage (amount, consistency, odor) As noted above. Periwound:Intact, dry. Dressing procedure/placement/frequency: Patient's sacral wound is followed by Dr. Alan Ripper Sanger (Plastics) and Dr. Mardene Speak at [facility].  Saline dressings twice daily are ordered/performed for this wound and he would benefit from their continuing care post discharge.  The low air loss mattress replacement is provided and patient is encouraged and assisted to turn and reposition as he will allow, but offered every two hours.  Minimal bedlinens are used between patient and sleep surface.  Bilateral heel ulcers are healing despite no evidence of the Prevalon Boots previously provided. Twice daily saline dressings are ordered for these areas.  I will  obtain bilateral Prevalon Boots for pressure redistribution and to correct the lateral rotation of his LEs today for his use here as well as post discharge. The skin tear on the right lateral LE will likely resurface within 30 days; care is implemented using a soft silicone foam dressing today.  Patient periodically refuses topical care, repositioning and some of the ordered/offered nutritional supplements required for wound healing.  WOC nursing team will not follow, but will remain available to this patient, the nursing and medical team.  Please re-consult if needed. Time of this extended visit is 75 minutes today. Thanks, Ladona Mow, MSN, RN, GNP, [place], CWON-AP [phone]

## 2014-02-08 DEATH — deceased

## 2014-10-24 ENCOUNTER — Encounter (HOSPITAL_COMMUNITY): Payer: Self-pay | Admitting: Cardiology
